# Patient Record
Sex: Male | Born: 1950
Health system: Southern US, Community
[De-identification: ages and names within clinical notes are randomized; demographics above are authoritative.]

## PROBLEM LIST (undated history)

## (undated) DIAGNOSIS — E7800 Pure hypercholesterolemia, unspecified: Secondary | ICD-10-CM

## (undated) DIAGNOSIS — I10 Essential (primary) hypertension: Secondary | ICD-10-CM

## (undated) DIAGNOSIS — T8859XA Other complications of anesthesia, initial encounter: Secondary | ICD-10-CM

## (undated) HISTORY — PX: APPENDECTOMY: SHX54

## (undated) HISTORY — PX: ROTATOR CUFF REPAIR: SHX139

---

## 2007-12-12 ENCOUNTER — Ambulatory Visit (HOSPITAL_BASED_OUTPATIENT_CLINIC_OR_DEPARTMENT_OTHER): Admission: RE | Admit: 2007-12-12 | Discharge: 2007-12-12 | Payer: Self-pay | Admitting: Orthopedic Surgery

## 2011-03-12 LAB — BASIC METABOLIC PANEL
BUN: 9
CO2: 29
Chloride: 103
Creatinine, Ser: 0.94
Glucose, Bld: 104 — ABNORMAL HIGH

## 2016-12-26 DIAGNOSIS — M7660 Achilles tendinitis, unspecified leg: Secondary | ICD-10-CM | POA: Diagnosis not present

## 2016-12-26 DIAGNOSIS — M79669 Pain in unspecified lower leg: Secondary | ICD-10-CM | POA: Diagnosis not present

## 2017-02-03 DIAGNOSIS — Z6838 Body mass index (BMI) 38.0-38.9, adult: Secondary | ICD-10-CM | POA: Diagnosis not present

## 2017-02-03 DIAGNOSIS — Z1389 Encounter for screening for other disorder: Secondary | ICD-10-CM | POA: Diagnosis not present

## 2017-02-03 DIAGNOSIS — I10 Essential (primary) hypertension: Secondary | ICD-10-CM | POA: Diagnosis not present

## 2017-02-03 DIAGNOSIS — R5383 Other fatigue: Secondary | ICD-10-CM | POA: Diagnosis not present

## 2017-02-03 DIAGNOSIS — Z9181 History of falling: Secondary | ICD-10-CM | POA: Diagnosis not present

## 2017-02-03 DIAGNOSIS — E785 Hyperlipidemia, unspecified: Secondary | ICD-10-CM | POA: Diagnosis not present

## 2017-02-03 DIAGNOSIS — Z79899 Other long term (current) drug therapy: Secondary | ICD-10-CM | POA: Diagnosis not present

## 2017-02-08 DIAGNOSIS — R5383 Other fatigue: Secondary | ICD-10-CM | POA: Diagnosis not present

## 2017-02-16 DIAGNOSIS — D510 Vitamin B12 deficiency anemia due to intrinsic factor deficiency: Secondary | ICD-10-CM | POA: Diagnosis not present

## 2017-02-22 DIAGNOSIS — D510 Vitamin B12 deficiency anemia due to intrinsic factor deficiency: Secondary | ICD-10-CM | POA: Diagnosis not present

## 2017-03-04 DIAGNOSIS — D510 Vitamin B12 deficiency anemia due to intrinsic factor deficiency: Secondary | ICD-10-CM | POA: Diagnosis not present

## 2017-04-09 DIAGNOSIS — Z23 Encounter for immunization: Secondary | ICD-10-CM | POA: Diagnosis not present

## 2017-04-09 DIAGNOSIS — E039 Hypothyroidism, unspecified: Secondary | ICD-10-CM | POA: Diagnosis not present

## 2017-04-09 DIAGNOSIS — M1990 Unspecified osteoarthritis, unspecified site: Secondary | ICD-10-CM | POA: Diagnosis not present

## 2017-04-09 DIAGNOSIS — I10 Essential (primary) hypertension: Secondary | ICD-10-CM | POA: Diagnosis not present

## 2017-04-09 DIAGNOSIS — D510 Vitamin B12 deficiency anemia due to intrinsic factor deficiency: Secondary | ICD-10-CM | POA: Diagnosis not present

## 2017-04-09 DIAGNOSIS — Z6838 Body mass index (BMI) 38.0-38.9, adult: Secondary | ICD-10-CM | POA: Diagnosis not present

## 2017-04-09 DIAGNOSIS — Z1339 Encounter for screening examination for other mental health and behavioral disorders: Secondary | ICD-10-CM | POA: Diagnosis not present

## 2017-04-12 DIAGNOSIS — Z125 Encounter for screening for malignant neoplasm of prostate: Secondary | ICD-10-CM | POA: Diagnosis not present

## 2017-04-12 DIAGNOSIS — E785 Hyperlipidemia, unspecified: Secondary | ICD-10-CM | POA: Diagnosis not present

## 2017-04-15 DIAGNOSIS — Z87891 Personal history of nicotine dependence: Secondary | ICD-10-CM | POA: Diagnosis not present

## 2017-04-15 DIAGNOSIS — Z136 Encounter for screening for cardiovascular disorders: Secondary | ICD-10-CM | POA: Diagnosis not present

## 2017-04-15 DIAGNOSIS — R011 Cardiac murmur, unspecified: Secondary | ICD-10-CM | POA: Diagnosis not present

## 2017-04-15 DIAGNOSIS — F17210 Nicotine dependence, cigarettes, uncomplicated: Secondary | ICD-10-CM | POA: Diagnosis not present

## 2017-04-15 DIAGNOSIS — K7689 Other specified diseases of liver: Secondary | ICD-10-CM | POA: Diagnosis not present

## 2017-04-22 DIAGNOSIS — M6702 Short Achilles tendon (acquired), left ankle: Secondary | ICD-10-CM | POA: Diagnosis not present

## 2017-04-22 DIAGNOSIS — M7662 Achilles tendinitis, left leg: Secondary | ICD-10-CM | POA: Diagnosis not present

## 2017-04-22 DIAGNOSIS — M7732 Calcaneal spur, left foot: Secondary | ICD-10-CM | POA: Diagnosis not present

## 2017-04-26 DIAGNOSIS — R160 Hepatomegaly, not elsewhere classified: Secondary | ICD-10-CM | POA: Diagnosis not present

## 2017-04-26 DIAGNOSIS — K760 Fatty (change of) liver, not elsewhere classified: Secondary | ICD-10-CM | POA: Diagnosis not present

## 2017-05-13 DIAGNOSIS — M6702 Short Achilles tendon (acquired), left ankle: Secondary | ICD-10-CM | POA: Diagnosis not present

## 2017-05-13 DIAGNOSIS — L603 Nail dystrophy: Secondary | ICD-10-CM | POA: Diagnosis not present

## 2017-05-13 DIAGNOSIS — M7662 Achilles tendinitis, left leg: Secondary | ICD-10-CM | POA: Diagnosis not present

## 2017-05-13 DIAGNOSIS — M7732 Calcaneal spur, left foot: Secondary | ICD-10-CM | POA: Diagnosis not present

## 2017-06-01 DIAGNOSIS — K760 Fatty (change of) liver, not elsewhere classified: Secondary | ICD-10-CM | POA: Diagnosis not present

## 2017-06-01 DIAGNOSIS — D510 Vitamin B12 deficiency anemia due to intrinsic factor deficiency: Secondary | ICD-10-CM | POA: Diagnosis not present

## 2017-06-01 DIAGNOSIS — R933 Abnormal findings on diagnostic imaging of other parts of digestive tract: Secondary | ICD-10-CM | POA: Diagnosis not present

## 2017-06-03 DIAGNOSIS — M6702 Short Achilles tendon (acquired), left ankle: Secondary | ICD-10-CM | POA: Diagnosis not present

## 2017-06-03 DIAGNOSIS — M7732 Calcaneal spur, left foot: Secondary | ICD-10-CM | POA: Diagnosis not present

## 2017-06-03 DIAGNOSIS — M7662 Achilles tendinitis, left leg: Secondary | ICD-10-CM | POA: Diagnosis not present

## 2017-06-03 DIAGNOSIS — L603 Nail dystrophy: Secondary | ICD-10-CM | POA: Diagnosis not present

## 2017-07-01 DIAGNOSIS — D510 Vitamin B12 deficiency anemia due to intrinsic factor deficiency: Secondary | ICD-10-CM | POA: Diagnosis not present

## 2017-08-11 DIAGNOSIS — D510 Vitamin B12 deficiency anemia due to intrinsic factor deficiency: Secondary | ICD-10-CM | POA: Diagnosis not present

## 2017-09-09 DIAGNOSIS — E7800 Pure hypercholesterolemia, unspecified: Secondary | ICD-10-CM | POA: Diagnosis not present

## 2017-09-09 DIAGNOSIS — I10 Essential (primary) hypertension: Secondary | ICD-10-CM | POA: Diagnosis not present

## 2017-09-09 DIAGNOSIS — Z6838 Body mass index (BMI) 38.0-38.9, adult: Secondary | ICD-10-CM | POA: Diagnosis not present

## 2017-10-18 DIAGNOSIS — D510 Vitamin B12 deficiency anemia due to intrinsic factor deficiency: Secondary | ICD-10-CM | POA: Diagnosis not present

## 2017-12-31 DIAGNOSIS — D510 Vitamin B12 deficiency anemia due to intrinsic factor deficiency: Secondary | ICD-10-CM | POA: Diagnosis not present

## 2018-02-28 DIAGNOSIS — D510 Vitamin B12 deficiency anemia due to intrinsic factor deficiency: Secondary | ICD-10-CM | POA: Diagnosis not present

## 2018-03-23 DIAGNOSIS — M25512 Pain in left shoulder: Secondary | ICD-10-CM | POA: Diagnosis not present

## 2018-03-23 DIAGNOSIS — M7542 Impingement syndrome of left shoulder: Secondary | ICD-10-CM | POA: Diagnosis not present

## 2018-03-29 DIAGNOSIS — D510 Vitamin B12 deficiency anemia due to intrinsic factor deficiency: Secondary | ICD-10-CM | POA: Diagnosis not present

## 2018-03-30 DIAGNOSIS — M25512 Pain in left shoulder: Secondary | ICD-10-CM | POA: Diagnosis not present

## 2018-03-30 DIAGNOSIS — R2689 Other abnormalities of gait and mobility: Secondary | ICD-10-CM | POA: Diagnosis not present

## 2018-04-01 DIAGNOSIS — M25512 Pain in left shoulder: Secondary | ICD-10-CM | POA: Diagnosis not present

## 2018-04-01 DIAGNOSIS — R2689 Other abnormalities of gait and mobility: Secondary | ICD-10-CM | POA: Diagnosis not present

## 2018-04-11 DIAGNOSIS — Z125 Encounter for screening for malignant neoplasm of prostate: Secondary | ICD-10-CM | POA: Diagnosis not present

## 2018-04-11 DIAGNOSIS — Z9181 History of falling: Secondary | ICD-10-CM | POA: Diagnosis not present

## 2018-04-11 DIAGNOSIS — Z79899 Other long term (current) drug therapy: Secondary | ICD-10-CM | POA: Diagnosis not present

## 2018-04-11 DIAGNOSIS — E039 Hypothyroidism, unspecified: Secondary | ICD-10-CM | POA: Diagnosis not present

## 2018-04-11 DIAGNOSIS — Z1331 Encounter for screening for depression: Secondary | ICD-10-CM | POA: Diagnosis not present

## 2018-04-11 DIAGNOSIS — Z2821 Immunization not carried out because of patient refusal: Secondary | ICD-10-CM | POA: Diagnosis not present

## 2018-04-11 DIAGNOSIS — E785 Hyperlipidemia, unspecified: Secondary | ICD-10-CM | POA: Diagnosis not present

## 2018-04-11 DIAGNOSIS — I10 Essential (primary) hypertension: Secondary | ICD-10-CM | POA: Diagnosis not present

## 2018-04-11 DIAGNOSIS — Z0000 Encounter for general adult medical examination without abnormal findings: Secondary | ICD-10-CM | POA: Diagnosis not present

## 2018-04-27 DIAGNOSIS — Z23 Encounter for immunization: Secondary | ICD-10-CM | POA: Diagnosis not present

## 2018-05-03 DIAGNOSIS — D510 Vitamin B12 deficiency anemia due to intrinsic factor deficiency: Secondary | ICD-10-CM | POA: Diagnosis not present

## 2018-05-09 DIAGNOSIS — M25512 Pain in left shoulder: Secondary | ICD-10-CM | POA: Diagnosis not present

## 2018-05-19 DIAGNOSIS — M19012 Primary osteoarthritis, left shoulder: Secondary | ICD-10-CM | POA: Diagnosis not present

## 2018-05-19 DIAGNOSIS — M25512 Pain in left shoulder: Secondary | ICD-10-CM | POA: Diagnosis not present

## 2018-05-20 DIAGNOSIS — K7689 Other specified diseases of liver: Secondary | ICD-10-CM | POA: Diagnosis not present

## 2018-05-20 DIAGNOSIS — K760 Fatty (change of) liver, not elsewhere classified: Secondary | ICD-10-CM | POA: Diagnosis not present

## 2018-05-23 DIAGNOSIS — D510 Vitamin B12 deficiency anemia due to intrinsic factor deficiency: Secondary | ICD-10-CM | POA: Diagnosis not present

## 2018-05-23 DIAGNOSIS — S46012A Strain of muscle(s) and tendon(s) of the rotator cuff of left shoulder, initial encounter: Secondary | ICD-10-CM | POA: Diagnosis not present

## 2018-06-14 DIAGNOSIS — K760 Fatty (change of) liver, not elsewhere classified: Secondary | ICD-10-CM | POA: Diagnosis not present

## 2018-06-17 DIAGNOSIS — E7800 Pure hypercholesterolemia, unspecified: Secondary | ICD-10-CM | POA: Diagnosis not present

## 2018-06-17 DIAGNOSIS — M65812 Other synovitis and tenosynovitis, left shoulder: Secondary | ICD-10-CM | POA: Diagnosis not present

## 2018-06-17 DIAGNOSIS — G4730 Sleep apnea, unspecified: Secondary | ICD-10-CM | POA: Diagnosis not present

## 2018-06-17 DIAGNOSIS — G8918 Other acute postprocedural pain: Secondary | ICD-10-CM | POA: Diagnosis not present

## 2018-06-17 DIAGNOSIS — Z01818 Encounter for other preprocedural examination: Secondary | ICD-10-CM | POA: Diagnosis not present

## 2018-06-17 DIAGNOSIS — M75122 Complete rotator cuff tear or rupture of left shoulder, not specified as traumatic: Secondary | ICD-10-CM | POA: Diagnosis not present

## 2018-06-17 DIAGNOSIS — S43432A Superior glenoid labrum lesion of left shoulder, initial encounter: Secondary | ICD-10-CM | POA: Diagnosis not present

## 2018-06-17 DIAGNOSIS — Z87891 Personal history of nicotine dependence: Secondary | ICD-10-CM | POA: Diagnosis not present

## 2018-06-17 DIAGNOSIS — M6788 Other specified disorders of synovium and tendon, other site: Secondary | ICD-10-CM | POA: Diagnosis not present

## 2018-06-17 DIAGNOSIS — S46012A Strain of muscle(s) and tendon(s) of the rotator cuff of left shoulder, initial encounter: Secondary | ICD-10-CM | POA: Diagnosis not present

## 2018-06-17 DIAGNOSIS — Z6838 Body mass index (BMI) 38.0-38.9, adult: Secondary | ICD-10-CM | POA: Diagnosis not present

## 2018-06-17 DIAGNOSIS — M75102 Unspecified rotator cuff tear or rupture of left shoulder, not specified as traumatic: Secondary | ICD-10-CM | POA: Diagnosis not present

## 2018-06-17 DIAGNOSIS — I10 Essential (primary) hypertension: Secondary | ICD-10-CM | POA: Diagnosis not present

## 2018-06-17 DIAGNOSIS — Z7982 Long term (current) use of aspirin: Secondary | ICD-10-CM | POA: Diagnosis not present

## 2018-06-17 DIAGNOSIS — Z01810 Encounter for preprocedural cardiovascular examination: Secondary | ICD-10-CM | POA: Diagnosis not present

## 2018-06-17 DIAGNOSIS — M1990 Unspecified osteoarthritis, unspecified site: Secondary | ICD-10-CM | POA: Diagnosis not present

## 2018-06-17 DIAGNOSIS — M25512 Pain in left shoulder: Secondary | ICD-10-CM | POA: Diagnosis not present

## 2018-06-17 DIAGNOSIS — Z79899 Other long term (current) drug therapy: Secondary | ICD-10-CM | POA: Diagnosis not present

## 2018-06-17 DIAGNOSIS — M7552 Bursitis of left shoulder: Secondary | ICD-10-CM | POA: Diagnosis not present

## 2018-06-20 DIAGNOSIS — M25612 Stiffness of left shoulder, not elsewhere classified: Secondary | ICD-10-CM | POA: Diagnosis not present

## 2018-06-20 DIAGNOSIS — M25412 Effusion, left shoulder: Secondary | ICD-10-CM | POA: Diagnosis not present

## 2018-06-20 DIAGNOSIS — M25512 Pain in left shoulder: Secondary | ICD-10-CM | POA: Diagnosis not present

## 2018-06-29 DIAGNOSIS — M25412 Effusion, left shoulder: Secondary | ICD-10-CM | POA: Diagnosis not present

## 2018-06-29 DIAGNOSIS — M25512 Pain in left shoulder: Secondary | ICD-10-CM | POA: Diagnosis not present

## 2018-06-29 DIAGNOSIS — M25612 Stiffness of left shoulder, not elsewhere classified: Secondary | ICD-10-CM | POA: Diagnosis not present

## 2018-07-07 DIAGNOSIS — M25412 Effusion, left shoulder: Secondary | ICD-10-CM | POA: Diagnosis not present

## 2018-07-07 DIAGNOSIS — M25612 Stiffness of left shoulder, not elsewhere classified: Secondary | ICD-10-CM | POA: Diagnosis not present

## 2018-07-07 DIAGNOSIS — M25512 Pain in left shoulder: Secondary | ICD-10-CM | POA: Diagnosis not present

## 2018-07-11 DIAGNOSIS — M25412 Effusion, left shoulder: Secondary | ICD-10-CM | POA: Diagnosis not present

## 2018-07-11 DIAGNOSIS — M25612 Stiffness of left shoulder, not elsewhere classified: Secondary | ICD-10-CM | POA: Diagnosis not present

## 2018-07-11 DIAGNOSIS — M25512 Pain in left shoulder: Secondary | ICD-10-CM | POA: Diagnosis not present

## 2018-07-13 DIAGNOSIS — L603 Nail dystrophy: Secondary | ICD-10-CM | POA: Diagnosis not present

## 2018-07-13 DIAGNOSIS — M7732 Calcaneal spur, left foot: Secondary | ICD-10-CM | POA: Diagnosis not present

## 2018-07-13 DIAGNOSIS — M7662 Achilles tendinitis, left leg: Secondary | ICD-10-CM | POA: Diagnosis not present

## 2018-08-04 DIAGNOSIS — M6528 Calcific tendinitis, other site: Secondary | ICD-10-CM | POA: Diagnosis not present

## 2018-08-05 DIAGNOSIS — D510 Vitamin B12 deficiency anemia due to intrinsic factor deficiency: Secondary | ICD-10-CM | POA: Diagnosis not present

## 2018-08-31 DIAGNOSIS — M542 Cervicalgia: Secondary | ICD-10-CM | POA: Diagnosis not present

## 2018-08-31 DIAGNOSIS — M9901 Segmental and somatic dysfunction of cervical region: Secondary | ICD-10-CM | POA: Diagnosis not present

## 2018-08-31 DIAGNOSIS — M545 Low back pain: Secondary | ICD-10-CM | POA: Diagnosis not present

## 2018-08-31 DIAGNOSIS — M9903 Segmental and somatic dysfunction of lumbar region: Secondary | ICD-10-CM | POA: Diagnosis not present

## 2018-08-31 DIAGNOSIS — M9902 Segmental and somatic dysfunction of thoracic region: Secondary | ICD-10-CM | POA: Diagnosis not present

## 2018-09-19 DIAGNOSIS — M25412 Effusion, left shoulder: Secondary | ICD-10-CM | POA: Diagnosis not present

## 2018-09-19 DIAGNOSIS — M25612 Stiffness of left shoulder, not elsewhere classified: Secondary | ICD-10-CM | POA: Diagnosis not present

## 2018-09-19 DIAGNOSIS — M25512 Pain in left shoulder: Secondary | ICD-10-CM | POA: Diagnosis not present

## 2018-09-22 DIAGNOSIS — M25512 Pain in left shoulder: Secondary | ICD-10-CM | POA: Diagnosis not present

## 2018-09-22 DIAGNOSIS — M25612 Stiffness of left shoulder, not elsewhere classified: Secondary | ICD-10-CM | POA: Diagnosis not present

## 2018-09-22 DIAGNOSIS — M25412 Effusion, left shoulder: Secondary | ICD-10-CM | POA: Diagnosis not present

## 2018-09-29 DIAGNOSIS — M25412 Effusion, left shoulder: Secondary | ICD-10-CM | POA: Diagnosis not present

## 2018-09-29 DIAGNOSIS — M25512 Pain in left shoulder: Secondary | ICD-10-CM | POA: Diagnosis not present

## 2018-09-29 DIAGNOSIS — M25612 Stiffness of left shoulder, not elsewhere classified: Secondary | ICD-10-CM | POA: Diagnosis not present

## 2018-10-03 DIAGNOSIS — M25512 Pain in left shoulder: Secondary | ICD-10-CM | POA: Diagnosis not present

## 2018-10-03 DIAGNOSIS — M25612 Stiffness of left shoulder, not elsewhere classified: Secondary | ICD-10-CM | POA: Diagnosis not present

## 2018-10-03 DIAGNOSIS — M25412 Effusion, left shoulder: Secondary | ICD-10-CM | POA: Diagnosis not present

## 2018-10-06 DIAGNOSIS — M25612 Stiffness of left shoulder, not elsewhere classified: Secondary | ICD-10-CM | POA: Diagnosis not present

## 2018-10-06 DIAGNOSIS — M25412 Effusion, left shoulder: Secondary | ICD-10-CM | POA: Diagnosis not present

## 2018-10-06 DIAGNOSIS — M25512 Pain in left shoulder: Secondary | ICD-10-CM | POA: Diagnosis not present

## 2018-10-07 DIAGNOSIS — Z6836 Body mass index (BMI) 36.0-36.9, adult: Secondary | ICD-10-CM | POA: Diagnosis not present

## 2018-10-07 DIAGNOSIS — E538 Deficiency of other specified B group vitamins: Secondary | ICD-10-CM | POA: Diagnosis not present

## 2018-10-07 DIAGNOSIS — R600 Localized edema: Secondary | ICD-10-CM | POA: Diagnosis not present

## 2018-10-07 DIAGNOSIS — I10 Essential (primary) hypertension: Secondary | ICD-10-CM | POA: Diagnosis not present

## 2018-10-11 DIAGNOSIS — M25412 Effusion, left shoulder: Secondary | ICD-10-CM | POA: Diagnosis not present

## 2018-10-11 DIAGNOSIS — M25512 Pain in left shoulder: Secondary | ICD-10-CM | POA: Diagnosis not present

## 2018-10-11 DIAGNOSIS — M25612 Stiffness of left shoulder, not elsewhere classified: Secondary | ICD-10-CM | POA: Diagnosis not present

## 2018-10-13 DIAGNOSIS — M25612 Stiffness of left shoulder, not elsewhere classified: Secondary | ICD-10-CM | POA: Diagnosis not present

## 2018-10-13 DIAGNOSIS — M25512 Pain in left shoulder: Secondary | ICD-10-CM | POA: Diagnosis not present

## 2018-10-13 DIAGNOSIS — S46012A Strain of muscle(s) and tendon(s) of the rotator cuff of left shoulder, initial encounter: Secondary | ICD-10-CM | POA: Diagnosis not present

## 2018-10-13 DIAGNOSIS — M25412 Effusion, left shoulder: Secondary | ICD-10-CM | POA: Diagnosis not present

## 2018-10-17 DIAGNOSIS — M25512 Pain in left shoulder: Secondary | ICD-10-CM | POA: Diagnosis not present

## 2018-10-17 DIAGNOSIS — M1612 Unilateral primary osteoarthritis, left hip: Secondary | ICD-10-CM | POA: Diagnosis not present

## 2018-10-19 DIAGNOSIS — S46012A Strain of muscle(s) and tendon(s) of the rotator cuff of left shoulder, initial encounter: Secondary | ICD-10-CM | POA: Diagnosis not present

## 2018-10-20 DIAGNOSIS — M25612 Stiffness of left shoulder, not elsewhere classified: Secondary | ICD-10-CM | POA: Diagnosis not present

## 2018-10-20 DIAGNOSIS — M25512 Pain in left shoulder: Secondary | ICD-10-CM | POA: Diagnosis not present

## 2018-10-20 DIAGNOSIS — M25412 Effusion, left shoulder: Secondary | ICD-10-CM | POA: Diagnosis not present

## 2018-10-26 DIAGNOSIS — M25412 Effusion, left shoulder: Secondary | ICD-10-CM | POA: Diagnosis not present

## 2018-10-26 DIAGNOSIS — M25512 Pain in left shoulder: Secondary | ICD-10-CM | POA: Diagnosis not present

## 2018-10-26 DIAGNOSIS — M25612 Stiffness of left shoulder, not elsewhere classified: Secondary | ICD-10-CM | POA: Diagnosis not present

## 2018-11-08 DIAGNOSIS — M25612 Stiffness of left shoulder, not elsewhere classified: Secondary | ICD-10-CM | POA: Diagnosis not present

## 2018-11-08 DIAGNOSIS — M25412 Effusion, left shoulder: Secondary | ICD-10-CM | POA: Diagnosis not present

## 2018-11-08 DIAGNOSIS — M25512 Pain in left shoulder: Secondary | ICD-10-CM | POA: Diagnosis not present

## 2018-11-23 DIAGNOSIS — M25612 Stiffness of left shoulder, not elsewhere classified: Secondary | ICD-10-CM | POA: Diagnosis not present

## 2018-11-23 DIAGNOSIS — M25512 Pain in left shoulder: Secondary | ICD-10-CM | POA: Diagnosis not present

## 2019-01-17 DIAGNOSIS — M25572 Pain in left ankle and joints of left foot: Secondary | ICD-10-CM | POA: Diagnosis not present

## 2019-01-17 DIAGNOSIS — M7989 Other specified soft tissue disorders: Secondary | ICD-10-CM | POA: Diagnosis not present

## 2019-01-17 DIAGNOSIS — R936 Abnormal findings on diagnostic imaging of limbs: Secondary | ICD-10-CM | POA: Diagnosis not present

## 2019-02-07 DIAGNOSIS — D519 Vitamin B12 deficiency anemia, unspecified: Secondary | ICD-10-CM | POA: Diagnosis not present

## 2019-02-07 DIAGNOSIS — H524 Presbyopia: Secondary | ICD-10-CM | POA: Diagnosis not present

## 2019-02-07 DIAGNOSIS — H26491 Other secondary cataract, right eye: Secondary | ICD-10-CM | POA: Diagnosis not present

## 2019-02-07 DIAGNOSIS — Z961 Presence of intraocular lens: Secondary | ICD-10-CM | POA: Diagnosis not present

## 2019-03-13 DIAGNOSIS — D519 Vitamin B12 deficiency anemia, unspecified: Secondary | ICD-10-CM | POA: Diagnosis not present

## 2019-03-13 DIAGNOSIS — Z23 Encounter for immunization: Secondary | ICD-10-CM | POA: Diagnosis not present

## 2019-03-20 DIAGNOSIS — Z86010 Personal history of colonic polyps: Secondary | ICD-10-CM | POA: Diagnosis not present

## 2019-03-20 DIAGNOSIS — K635 Polyp of colon: Secondary | ICD-10-CM | POA: Diagnosis not present

## 2019-03-20 DIAGNOSIS — Z1211 Encounter for screening for malignant neoplasm of colon: Secondary | ICD-10-CM | POA: Diagnosis not present

## 2019-03-20 DIAGNOSIS — K5730 Diverticulosis of large intestine without perforation or abscess without bleeding: Secondary | ICD-10-CM | POA: Diagnosis not present

## 2019-04-13 DIAGNOSIS — Z125 Encounter for screening for malignant neoplasm of prostate: Secondary | ICD-10-CM | POA: Diagnosis not present

## 2019-04-13 DIAGNOSIS — Z0000 Encounter for general adult medical examination without abnormal findings: Secondary | ICD-10-CM | POA: Diagnosis not present

## 2019-04-13 DIAGNOSIS — Z79899 Other long term (current) drug therapy: Secondary | ICD-10-CM | POA: Diagnosis not present

## 2019-04-13 DIAGNOSIS — Z6837 Body mass index (BMI) 37.0-37.9, adult: Secondary | ICD-10-CM | POA: Diagnosis not present

## 2019-04-13 DIAGNOSIS — E039 Hypothyroidism, unspecified: Secondary | ICD-10-CM | POA: Diagnosis not present

## 2019-04-13 DIAGNOSIS — Z1331 Encounter for screening for depression: Secondary | ICD-10-CM | POA: Diagnosis not present

## 2019-04-13 DIAGNOSIS — E785 Hyperlipidemia, unspecified: Secondary | ICD-10-CM | POA: Diagnosis not present

## 2019-04-13 DIAGNOSIS — Z9181 History of falling: Secondary | ICD-10-CM | POA: Diagnosis not present

## 2019-04-24 DIAGNOSIS — L814 Other melanin hyperpigmentation: Secondary | ICD-10-CM | POA: Diagnosis not present

## 2019-04-24 DIAGNOSIS — L570 Actinic keratosis: Secondary | ICD-10-CM | POA: Diagnosis not present

## 2019-04-24 DIAGNOSIS — D485 Neoplasm of uncertain behavior of skin: Secondary | ICD-10-CM | POA: Diagnosis not present

## 2019-04-24 DIAGNOSIS — L820 Inflamed seborrheic keratosis: Secondary | ICD-10-CM | POA: Diagnosis not present

## 2019-04-24 DIAGNOSIS — D2272 Melanocytic nevi of left lower limb, including hip: Secondary | ICD-10-CM | POA: Diagnosis not present

## 2019-04-24 DIAGNOSIS — D692 Other nonthrombocytopenic purpura: Secondary | ICD-10-CM | POA: Diagnosis not present

## 2019-04-24 DIAGNOSIS — X32XXXS Exposure to sunlight, sequela: Secondary | ICD-10-CM | POA: Diagnosis not present

## 2019-05-03 DIAGNOSIS — E538 Deficiency of other specified B group vitamins: Secondary | ICD-10-CM | POA: Diagnosis not present

## 2019-05-25 DIAGNOSIS — R0989 Other specified symptoms and signs involving the circulatory and respiratory systems: Secondary | ICD-10-CM | POA: Diagnosis not present

## 2019-07-12 DIAGNOSIS — K760 Fatty (change of) liver, not elsewhere classified: Secondary | ICD-10-CM | POA: Diagnosis not present

## 2019-07-12 DIAGNOSIS — E538 Deficiency of other specified B group vitamins: Secondary | ICD-10-CM | POA: Diagnosis not present

## 2019-10-24 DIAGNOSIS — M6702 Short Achilles tendon (acquired), left ankle: Secondary | ICD-10-CM | POA: Diagnosis not present

## 2019-10-24 DIAGNOSIS — M7732 Calcaneal spur, left foot: Secondary | ICD-10-CM | POA: Diagnosis not present

## 2019-10-24 DIAGNOSIS — M7662 Achilles tendinitis, left leg: Secondary | ICD-10-CM | POA: Diagnosis not present

## 2019-10-27 DIAGNOSIS — D519 Vitamin B12 deficiency anemia, unspecified: Secondary | ICD-10-CM | POA: Diagnosis not present

## 2019-10-30 DIAGNOSIS — M25672 Stiffness of left ankle, not elsewhere classified: Secondary | ICD-10-CM | POA: Diagnosis not present

## 2019-10-30 DIAGNOSIS — R262 Difficulty in walking, not elsewhere classified: Secondary | ICD-10-CM | POA: Diagnosis not present

## 2019-10-30 DIAGNOSIS — M25572 Pain in left ankle and joints of left foot: Secondary | ICD-10-CM | POA: Diagnosis not present

## 2019-10-30 DIAGNOSIS — M7732 Calcaneal spur, left foot: Secondary | ICD-10-CM | POA: Diagnosis not present

## 2019-10-30 DIAGNOSIS — M7662 Achilles tendinitis, left leg: Secondary | ICD-10-CM | POA: Diagnosis not present

## 2019-11-03 DIAGNOSIS — M7732 Calcaneal spur, left foot: Secondary | ICD-10-CM | POA: Diagnosis not present

## 2019-11-03 DIAGNOSIS — M7662 Achilles tendinitis, left leg: Secondary | ICD-10-CM | POA: Diagnosis not present

## 2019-11-03 DIAGNOSIS — R262 Difficulty in walking, not elsewhere classified: Secondary | ICD-10-CM | POA: Diagnosis not present

## 2019-11-03 DIAGNOSIS — M25572 Pain in left ankle and joints of left foot: Secondary | ICD-10-CM | POA: Diagnosis not present

## 2019-11-03 DIAGNOSIS — M25672 Stiffness of left ankle, not elsewhere classified: Secondary | ICD-10-CM | POA: Diagnosis not present

## 2019-11-06 DIAGNOSIS — M25461 Effusion, right knee: Secondary | ICD-10-CM | POA: Diagnosis not present

## 2019-11-06 DIAGNOSIS — M25561 Pain in right knee: Secondary | ICD-10-CM | POA: Diagnosis not present

## 2019-11-10 DIAGNOSIS — M7662 Achilles tendinitis, left leg: Secondary | ICD-10-CM | POA: Diagnosis not present

## 2019-11-10 DIAGNOSIS — R262 Difficulty in walking, not elsewhere classified: Secondary | ICD-10-CM | POA: Diagnosis not present

## 2019-11-10 DIAGNOSIS — M7732 Calcaneal spur, left foot: Secondary | ICD-10-CM | POA: Diagnosis not present

## 2019-11-10 DIAGNOSIS — M25672 Stiffness of left ankle, not elsewhere classified: Secondary | ICD-10-CM | POA: Diagnosis not present

## 2019-11-10 DIAGNOSIS — M25572 Pain in left ankle and joints of left foot: Secondary | ICD-10-CM | POA: Diagnosis not present

## 2020-02-01 DIAGNOSIS — H6121 Impacted cerumen, right ear: Secondary | ICD-10-CM | POA: Diagnosis not present

## 2020-02-01 DIAGNOSIS — H6122 Impacted cerumen, left ear: Secondary | ICD-10-CM | POA: Diagnosis not present

## 2020-02-07 DIAGNOSIS — M25461 Effusion, right knee: Secondary | ICD-10-CM | POA: Diagnosis not present

## 2020-02-07 DIAGNOSIS — M25561 Pain in right knee: Secondary | ICD-10-CM | POA: Diagnosis not present

## 2020-02-16 DIAGNOSIS — S83231A Complex tear of medial meniscus, current injury, right knee, initial encounter: Secondary | ICD-10-CM | POA: Diagnosis not present

## 2020-02-16 DIAGNOSIS — M25561 Pain in right knee: Secondary | ICD-10-CM | POA: Diagnosis not present

## 2020-02-16 DIAGNOSIS — G8929 Other chronic pain: Secondary | ICD-10-CM | POA: Diagnosis not present

## 2020-02-16 DIAGNOSIS — M23221 Derangement of posterior horn of medial meniscus due to old tear or injury, right knee: Secondary | ICD-10-CM | POA: Diagnosis not present

## 2020-02-16 DIAGNOSIS — M7121 Synovial cyst of popliteal space [Baker], right knee: Secondary | ICD-10-CM | POA: Diagnosis not present

## 2020-02-16 DIAGNOSIS — M25461 Effusion, right knee: Secondary | ICD-10-CM | POA: Diagnosis not present

## 2020-02-27 DIAGNOSIS — L570 Actinic keratosis: Secondary | ICD-10-CM | POA: Diagnosis not present

## 2020-02-27 DIAGNOSIS — D692 Other nonthrombocytopenic purpura: Secondary | ICD-10-CM | POA: Diagnosis not present

## 2020-02-29 DIAGNOSIS — M25561 Pain in right knee: Secondary | ICD-10-CM | POA: Diagnosis not present

## 2020-03-15 DIAGNOSIS — D510 Vitamin B12 deficiency anemia due to intrinsic factor deficiency: Secondary | ICD-10-CM | POA: Diagnosis not present

## 2020-03-25 DIAGNOSIS — Z23 Encounter for immunization: Secondary | ICD-10-CM | POA: Diagnosis not present

## 2020-05-29 DIAGNOSIS — R0981 Nasal congestion: Secondary | ICD-10-CM | POA: Diagnosis not present

## 2020-05-29 DIAGNOSIS — J0100 Acute maxillary sinusitis, unspecified: Secondary | ICD-10-CM | POA: Diagnosis not present

## 2020-05-29 DIAGNOSIS — H6121 Impacted cerumen, right ear: Secondary | ICD-10-CM | POA: Diagnosis not present

## 2020-05-29 DIAGNOSIS — R519 Headache, unspecified: Secondary | ICD-10-CM | POA: Diagnosis not present

## 2020-06-13 DIAGNOSIS — I10 Essential (primary) hypertension: Secondary | ICD-10-CM | POA: Diagnosis not present

## 2020-06-13 DIAGNOSIS — D519 Vitamin B12 deficiency anemia, unspecified: Secondary | ICD-10-CM | POA: Diagnosis not present

## 2020-06-13 DIAGNOSIS — E7800 Pure hypercholesterolemia, unspecified: Secondary | ICD-10-CM | POA: Diagnosis not present

## 2020-07-12 DIAGNOSIS — Z1331 Encounter for screening for depression: Secondary | ICD-10-CM | POA: Diagnosis not present

## 2020-07-12 DIAGNOSIS — T148XXA Other injury of unspecified body region, initial encounter: Secondary | ICD-10-CM | POA: Diagnosis not present

## 2020-07-12 DIAGNOSIS — Z125 Encounter for screening for malignant neoplasm of prostate: Secondary | ICD-10-CM | POA: Diagnosis not present

## 2020-07-12 DIAGNOSIS — Z79899 Other long term (current) drug therapy: Secondary | ICD-10-CM | POA: Diagnosis not present

## 2020-07-12 DIAGNOSIS — Z6837 Body mass index (BMI) 37.0-37.9, adult: Secondary | ICD-10-CM | POA: Diagnosis not present

## 2020-07-12 DIAGNOSIS — Z9181 History of falling: Secondary | ICD-10-CM | POA: Diagnosis not present

## 2020-07-12 DIAGNOSIS — Z0000 Encounter for general adult medical examination without abnormal findings: Secondary | ICD-10-CM | POA: Diagnosis not present

## 2020-07-12 DIAGNOSIS — E7800 Pure hypercholesterolemia, unspecified: Secondary | ICD-10-CM | POA: Diagnosis not present

## 2020-07-12 DIAGNOSIS — E039 Hypothyroidism, unspecified: Secondary | ICD-10-CM | POA: Diagnosis not present

## 2020-07-12 DIAGNOSIS — I10 Essential (primary) hypertension: Secondary | ICD-10-CM | POA: Diagnosis not present

## 2020-07-14 DIAGNOSIS — D519 Vitamin B12 deficiency anemia, unspecified: Secondary | ICD-10-CM | POA: Diagnosis not present

## 2020-07-14 DIAGNOSIS — I10 Essential (primary) hypertension: Secondary | ICD-10-CM | POA: Diagnosis not present

## 2020-07-14 DIAGNOSIS — E7800 Pure hypercholesterolemia, unspecified: Secondary | ICD-10-CM | POA: Diagnosis not present

## 2020-08-12 DIAGNOSIS — I10 Essential (primary) hypertension: Secondary | ICD-10-CM | POA: Diagnosis not present

## 2020-08-12 DIAGNOSIS — E7800 Pure hypercholesterolemia, unspecified: Secondary | ICD-10-CM | POA: Diagnosis not present

## 2020-08-16 DIAGNOSIS — R1032 Left lower quadrant pain: Secondary | ICD-10-CM | POA: Diagnosis not present

## 2020-08-16 DIAGNOSIS — R319 Hematuria, unspecified: Secondary | ICD-10-CM | POA: Diagnosis not present

## 2020-08-16 DIAGNOSIS — N3091 Cystitis, unspecified with hematuria: Secondary | ICD-10-CM | POA: Diagnosis not present

## 2020-08-16 DIAGNOSIS — R109 Unspecified abdominal pain: Secondary | ICD-10-CM | POA: Diagnosis not present

## 2020-08-16 DIAGNOSIS — R1084 Generalized abdominal pain: Secondary | ICD-10-CM | POA: Diagnosis not present

## 2020-09-11 DIAGNOSIS — I10 Essential (primary) hypertension: Secondary | ICD-10-CM | POA: Diagnosis not present

## 2020-09-11 DIAGNOSIS — D510 Vitamin B12 deficiency anemia due to intrinsic factor deficiency: Secondary | ICD-10-CM | POA: Diagnosis not present

## 2020-09-11 DIAGNOSIS — E7800 Pure hypercholesterolemia, unspecified: Secondary | ICD-10-CM | POA: Diagnosis not present

## 2020-10-12 DIAGNOSIS — I10 Essential (primary) hypertension: Secondary | ICD-10-CM | POA: Diagnosis not present

## 2020-10-12 DIAGNOSIS — E7800 Pure hypercholesterolemia, unspecified: Secondary | ICD-10-CM | POA: Diagnosis not present

## 2020-10-12 DIAGNOSIS — D510 Vitamin B12 deficiency anemia due to intrinsic factor deficiency: Secondary | ICD-10-CM | POA: Diagnosis not present

## 2020-10-22 DIAGNOSIS — R310 Gross hematuria: Secondary | ICD-10-CM | POA: Diagnosis not present

## 2020-10-28 DIAGNOSIS — R310 Gross hematuria: Secondary | ICD-10-CM | POA: Diagnosis not present

## 2020-10-28 DIAGNOSIS — K5750 Diverticulosis of both small and large intestine without perforation or abscess without bleeding: Secondary | ICD-10-CM | POA: Diagnosis not present

## 2020-10-28 DIAGNOSIS — K3189 Other diseases of stomach and duodenum: Secondary | ICD-10-CM | POA: Diagnosis not present

## 2020-10-28 DIAGNOSIS — N3289 Other specified disorders of bladder: Secondary | ICD-10-CM | POA: Diagnosis not present

## 2020-11-13 ENCOUNTER — Encounter (HOSPITAL_COMMUNITY): Payer: Self-pay | Admitting: Urology

## 2020-11-13 ENCOUNTER — Observation Stay (HOSPITAL_COMMUNITY)
Admission: AD | Admit: 2020-11-13 | Discharge: 2020-11-14 | Disposition: A | Discharge status: Home / Self Care | Payer: Medicare Other | Source: Ambulatory Visit | Attending: Urology | Admitting: Urology

## 2020-11-13 ENCOUNTER — Other Ambulatory Visit: Payer: Self-pay

## 2020-11-13 DIAGNOSIS — D414 Neoplasm of uncertain behavior of bladder: Secondary | ICD-10-CM | POA: Diagnosis not present

## 2020-11-13 DIAGNOSIS — Z87891 Personal history of nicotine dependence: Secondary | ICD-10-CM | POA: Diagnosis not present

## 2020-11-13 DIAGNOSIS — N99820 Postprocedural hemorrhage and hematoma of a genitourinary system organ or structure following a genitourinary system procedure: Secondary | ICD-10-CM | POA: Diagnosis not present

## 2020-11-13 DIAGNOSIS — D494 Neoplasm of unspecified behavior of bladder: Secondary | ICD-10-CM | POA: Diagnosis not present

## 2020-11-13 DIAGNOSIS — C679 Malignant neoplasm of bladder, unspecified: Secondary | ICD-10-CM | POA: Diagnosis not present

## 2020-11-13 DIAGNOSIS — R58 Hemorrhage, not elsewhere classified: Secondary | ICD-10-CM | POA: Diagnosis present

## 2020-11-13 DIAGNOSIS — C678 Malignant neoplasm of overlapping sites of bladder: Secondary | ICD-10-CM | POA: Diagnosis not present

## 2020-11-13 DIAGNOSIS — R310 Gross hematuria: Secondary | ICD-10-CM | POA: Diagnosis not present

## 2020-11-13 DIAGNOSIS — I10 Essential (primary) hypertension: Secondary | ICD-10-CM | POA: Diagnosis not present

## 2020-11-13 HISTORY — DX: Essential (primary) hypertension: I10

## 2020-11-13 HISTORY — DX: Other complications of anesthesia, initial encounter: T88.59XA

## 2020-11-13 HISTORY — DX: Pure hypercholesterolemia, unspecified: E78.00

## 2020-11-13 LAB — HEMOGLOBIN AND HEMATOCRIT, BLOOD
HCT: 35.2 % — ABNORMAL LOW (ref 39.0–52.0)
Hemoglobin: 11.9 g/dL — ABNORMAL LOW (ref 13.0–17.0)

## 2020-11-13 MED ORDER — TRAMADOL HCL 50 MG PO TABS: 50.0000 mg | ORAL_TABLET | Freq: Four times a day (QID) | ORAL | Status: DC | PRN

## 2020-11-13 MED ORDER — BELLADONNA ALKALOIDS-OPIUM 16.2-60 MG RE SUPP: 1.0000 | Freq: Three times a day (TID) | RECTAL | Status: DC | PRN

## 2020-11-13 MED ORDER — BACITRACIN-NEOMYCIN-POLYMYXIN 400-5-5000 EX OINT: 1.0000 "application " | TOPICAL_OINTMENT | Freq: Three times a day (TID) | CUTANEOUS | Status: DC | PRN

## 2020-11-13 MED ORDER — DIPHENHYDRAMINE HCL 12.5 MG/5ML PO ELIX: 12.5000 mg | ORAL_SOLUTION | Freq: Four times a day (QID) | ORAL | Status: DC | PRN

## 2020-11-13 MED ORDER — ONDANSETRON HCL 4 MG/2ML IJ SOLN: 4.0000 mg | INTRAMUSCULAR | Status: DC | PRN

## 2020-11-13 MED ORDER — ACETAMINOPHEN 325 MG PO TABS: 650.0000 mg | ORAL_TABLET | ORAL | Status: DC | PRN

## 2020-11-13 MED ORDER — DEXTROSE-NACL 5-0.45 % IV SOLN: INTRAVENOUS | Status: DC

## 2020-11-13 MED ORDER — HYDROMORPHONE HCL 1 MG/ML IJ SOLN: 0.5000 mg | INTRAMUSCULAR | Status: DC | PRN

## 2020-11-13 MED ORDER — SODIUM CHLORIDE 0.9 % IR SOLN
3000.0000 mL | Status: DC
  Administered 2020-11-13 – 2020-11-14 (×6): 3000 mL

## 2020-11-13 MED ORDER — SODIUM CHLORIDE 0.9 % IV SOLN: INTRAVENOUS | Status: DC

## 2020-11-13 MED ORDER — DIPHENHYDRAMINE HCL 50 MG/ML IJ SOLN: 12.5000 mg | Freq: Four times a day (QID) | INTRAMUSCULAR | Status: DC | PRN

## 2020-11-13 NOTE — H&P (Signed)
Cc: Gross hematuria  History of present illness: 70 year old male who has a known bladder mass, who was taken to the operating room today on elective basis at the outpatient surgery center for a TURBT and bilateral retrograde pyelograms.  The tumor was quite large and broad-based.  It was located on the right lateral wall of the bladder.  The resection was largely uncomplicated, but notably incomplete due to the size of the tumor and the depth of the invasion.  Hemostasis was fairly well controlled at the end of the case, and a 58 French Foley catheter was placed.  He was placed on continuous bladder irrigation and returned to the PACU in stable condition.  In the PACU, he continued to have gross hematuria, which progressively worsened despite our best efforts at irrigating his bladder and keeping him on continuous bladder irrigation.  He was having significant spasm as well.  In addition, the patient was having labile blood pressures and complaining of hot flashes, nausea, and bladder pain.  Given his significant hematuria and clot, I recommended that the patient be directly admitted to [facility] for closer observation.  The patient has a past medical history of hypertension.  He also has recently been treated for chronic prostatitis.  He is an otherwise reasonably good shape.  Review of systems: A 12 point comprehensive review of systems was obtained and is negative unless otherwise stated in the history of present illness.  Patient Active Problem List   Diagnosis Date Noted  . Bleeding 11/13/2020    No current facility-administered medications on file prior to encounter.   No current outpatient medications on file prior to encounter.    Past Medical History:  Diagnosis Date  . Complication of anesthesia    vomiting only with ether  . High cholesterol   . Hypertension     Past Surgical History:  Procedure Laterality Date  . APPENDECTOMY    . ROTATOR CUFF REPAIR Bilateral      Social History   Tobacco Use  . Smoking status: Former Smoker    Packs/day: 3.50    Years: 20.00    Pack years: 70.00    Types: Cigarettes    Quit date: 11/14/1990    Years since quitting: 30.0  . Smokeless tobacco: Never Used  Vaping Use  . Vaping Use: Never used  Substance Use Topics  . Alcohol use: Yes    Comment: 3 cans beer daily  . Drug use: Never    History reviewed. No pertinent family history.  PE: Vitals:   11/13/20 1855  BP: 133/76  Pulse: 67  Resp: 18  Temp: 98 F (36.7 C)  TempSrc: Oral  SpO2: 98%  Weight: 107.8 kg  Height: 6' (1.829 m)   Patient appears to be in no acute distress  patient is alert and oriented x3 Atraumatic normocephalic head No cervical or supraclavicular lymphadenopathy appreciated No increased work of breathing, no audible wheezes/rhonchi Regular sinus rhythm/rate Abdomen is soft, nontender, nondistended, no CVA or suprapubic tenderness Lower extremities are symmetric without appreciable edema Currently, the Foley catheter is in place and draining pink urine on a very slow CBI Grossly neurologically intact No identifiable skin lesions  Recent Labs    11/13/20 2037  HGB 11.9*  HCT 35.2*   No results for input(s): NA, K, CL, CO2, GLUCOSE, BUN, CREATININE, CALCIUM in the last 72 hours. No results for input(s): LABPT, INR in the last 72 hours. No results for input(s): LABURIN in the last 72 hours. No  results found for this or any previous visit.  Imaging: none  Imp: Gross hematuria secondary to a large TURBT with bleeding from the bladder tumor base.  He is now stabilized.  His blood pressure has also stabilized and is no longer labile.  The bleeding is significantly improved on a very gentle CBI.  He is starting to feel better and tolerating food.  Recommendations: Our plan is to admit the patient for overnight observation.  We will continue with continuous bladder irrigation, wean off as able.  In addition, we will make  him n.p.o. in the event that things worsen overnight and he does require fulguration of the area is bleeding in his bladder.  We will keep him on telemetry.  We will give him normal saline for IV fluids.  We will use BNO suppositories as needed for pain control.    Ardis Hughs

## 2020-11-14 DIAGNOSIS — N99820 Postprocedural hemorrhage and hematoma of a genitourinary system organ or structure following a genitourinary system procedure: Secondary | ICD-10-CM | POA: Diagnosis not present

## 2020-11-14 DIAGNOSIS — D414 Neoplasm of uncertain behavior of bladder: Secondary | ICD-10-CM | POA: Diagnosis not present

## 2020-11-14 DIAGNOSIS — I10 Essential (primary) hypertension: Secondary | ICD-10-CM | POA: Diagnosis not present

## 2020-11-14 DIAGNOSIS — Z87891 Personal history of nicotine dependence: Secondary | ICD-10-CM | POA: Diagnosis not present

## 2020-11-14 DIAGNOSIS — R310 Gross hematuria: Secondary | ICD-10-CM | POA: Diagnosis not present

## 2020-11-14 LAB — BASIC METABOLIC PANEL
Anion gap: 7 (ref 5–15)
BUN: 20 mg/dL (ref 8–23)
CO2: 26 mmol/L (ref 22–32)
Calcium: 8.3 mg/dL — ABNORMAL LOW (ref 8.9–10.3)
Chloride: 101 mmol/L (ref 98–111)
Creatinine, Ser: 0.95 mg/dL (ref 0.61–1.24)
GFR, Estimated: >60 mL/min (ref >60)
Glucose, Bld: 212 mg/dL — ABNORMAL HIGH (ref 70–99)
Potassium: 4.2 mmol/L (ref 3.5–5.1)
Sodium: 134 mmol/L — ABNORMAL LOW (ref 135–145)

## 2020-11-14 LAB — HEMOGLOBIN AND HEMATOCRIT, BLOOD
HCT: 30.1 % — ABNORMAL LOW (ref 39.0–52.0)
Hemoglobin: 10.3 g/dL — ABNORMAL LOW (ref 13.0–17.0)

## 2020-11-14 LAB — HIV ANTIBODY (ROUTINE TESTING W REFLEX): HIV Screen 4th Generation wRfx: NONREACTIVE

## 2020-11-14 MED ORDER — TRAMADOL HCL 50 MG PO TABS: 50.0000 mg | ORAL_TABLET | Freq: Four times a day (QID) | ORAL | 0 refills | Status: DC | PRN

## 2020-11-14 MED ORDER — CEPHALEXIN 500 MG PO CAPS: 500.0000 mg | ORAL_CAPSULE | Freq: Three times a day (TID) | ORAL | 0 refills | Status: AC

## 2020-11-14 NOTE — Discharge Summary (Signed)
Date of admission: 11/13/2020  Date of discharge: 11/14/2020  Admission diagnosis: gross hematuria  Discharge diagnosis: same  Secondary diagnoses:  Patient Active Problem List   Diagnosis Date Noted  . Bleeding 11/13/2020    Procedures performed:   History and Physical: For full details, please see admission history and physical. Briefly, Javier Sellers is a 70 y.o. year old patient with gross hematuria after a TURBT at the surgery center.  He developed significant bleeding.   Hospital Course: the patient was admitted to and his bladder was irrigated by hand to get it to flow.  It was then weaned over the course of the night.  On HOD#2 the urine was clear on a slow drip.  The CBI was stopped and it remained pink. His hospital course was uncomplicated.  On POD#1 he had met discharge criteria: was eating a regular diet, was up and ambulating independently,  pain was well controlled, and was ready to for discharge.   Laboratory values:  Recent Labs    11/13/20 2037 11/14/20 0330  HGB 11.9* 10.3*  HCT 35.2* 30.1*   Recent Labs    11/14/20 0330  NA 134*  K 4.2  CL 101  CO2 26  GLUCOSE 212*  BUN 20  CREATININE 0.95  CALCIUM 8.3*   No results for input(s): LABPT, INR in the last 72 hours. No results for input(s): LABURIN in the last 72 hours. No results found for this or any previous visit.  Disposition: Home  Discharge instruction: The patient was instructed to be ambulatory but told to refrain from heavy lifting, strenuous activity, or driving.   Discharge medications:  Allergies as of 11/14/2020   No Known Allergies     Medication List    STOP taking these medications   meloxicam 15 MG tablet Commonly known as: MOBIC     TAKE these medications   amLODipine 5 MG tablet Commonly known as: NORVASC Take 1 tablet by mouth every morning.   cephALEXin 500 MG capsule Commonly known as: KEFLEX Take 1 capsule (500 mg total) by mouth 3 (three) times daily for 3 days.    lisinopril 40 MG tablet Commonly known as: ZESTRIL Take 40 mg by mouth every morning.   metoprolol succinate 100 MG 24 hr tablet Commonly known as: TOPROL-XL Take 100 mg by mouth every morning.   pravastatin 80 MG tablet Commonly known as: PRAVACHOL Take 80 mg by mouth at bedtime.   tamsulosin 0.4 MG Caps capsule Commonly known as: FLOMAX Take 0.4 mg by mouth daily.   traMADol 50 MG tablet Commonly known as: ULTRAM Take 1-2 tablets (50-100 mg total) by mouth every 6 (six) hours as needed for moderate pain.       Followup: [facility], 6/3 at 9:45 for catheter removal.

## 2020-11-14 NOTE — Discharge Planning (Signed)
Removed patient's IV and telemetry. Went over catheter care with the patient and answered all questions. Plan is for him to follow up in the office tomorrow for catheter removal.

## 2020-11-14 NOTE — Plan of Care (Signed)
  Problem: Education: Goal: Knowledge of the prescribed therapeutic regimen will improve Outcome: Adequate for Discharge

## 2020-11-14 NOTE — Discharge Instructions (Signed)
Transurethral Resection of Bladder Tumor (TURBT) or Bladder Biopsy   Definition:  Transurethral Resection of the Bladder Tumor is a surgical procedure used to diagnose and remove tumors within the bladder. TURBT is the most common treatment for early stage bladder cancer.  General instructions:     Your recent bladder surgery requires very little post hospital care but some definite precautions.  Despite the fact that no skin incisions were used, the area around the bladder incisions are raw and covered with scabs to promote healing and prevent bleeding. Certain precautions are needed to insure that the scabs are not disturbed over the next 2-4 weeks while the healing proceeds.  Because the raw surface inside your bladder and the irritating effects of urine you may expect frequency of urination and/or urgency (a stronger desire to urinate) and perhaps even getting up at night more often. This will usually resolve or improve slowly over the healing period. You may see some blood in your urine over the first 6 weeks. Do not be alarmed, even if the urine was clear for a while. Get off your feet and drink lots of fluids until clearing occurs. If you start to pass clots or don't improve call us.  Diet:  You may return to your normal diet immediately. Because of the raw surface of your bladder, alcohol, spicy foods, foods high in acid and drinks with caffeine may cause irritation or frequency and should be used in moderation. To keep your urine flowing freely and avoid constipation, drink plenty of fluids during the day (8-10 glasses). Tip: Avoid cranberry juice because it is very acidic.  Activity:  Your physical activity doesn't need to be restricted. However, if you are very active, you may see some blood in the urine. We suggest that you reduce your activity under the circumstances until the bleeding has stopped.  Bowels:  It is important to keep your bowels regular during the postoperative  period. Straining with bowel movements can cause bleeding. A bowel movement every other day is reasonable. Use a mild laxative if needed, such as milk of magnesia 2-3 tablespoons, or 2 Dulcolax tablets. Call if you continue to have problems. If you had been taking narcotics for pain, before, during or after your surgery, you may be constipated. Take a laxative if necessary.    Medication:  You should resume your pre-surgery medications unless told not to. In addition you may be given an antibiotic to prevent or treat infection. Antibiotics are not always necessary. All medication should be taken as prescribed until the bottles are finished unless you are having an unusual reaction to one of the drugs.     Foley Catheter Care A soft, flexible tube (Foley catheter) may have been placed in your bladder to drain urine and fluid. Follow these instructions: Taking Care of the Catheter  Keep the area where the catheter leaves your body clean.   Attach the catheter to the leg so there is no tension on the catheter.   Keep the drainage bag below the level of the bladder, but keep it OFF the floor.   Do not take long soaking baths. Your caregiver will give instructions about showering.   Wash your hands before touching ANYTHING related to the catheter or bag.   Using mild soap and warm water on a washcloth:   Clean the area closest to the catheter insertion site using a circular motion around the catheter.   Clean the catheter itself by wiping AWAY from the insertion  site for several inches down the tube.   NEVER wipe upward as this could sweep bacteria up into the urethra (tube in your body that normally drains the bladder) and cause infection.   Place a small amount of sterile lubricant at the tip of the penis where the catheter is entering.  Taking Care of the Drainage Bags  Two drainage bags may be taken home: a large overnight drainage bag, and a smaller leg bag which fits underneath  clothing.   It is okay to wear the overnight bag at any time, but NEVER wear the smaller leg bag at night.   Keep the drainage bag well below the level of your bladder. This prevents backflow of urine into the bladder and allows the urine to drain freely.   Anchor the tubing to your leg to prevent pulling or tension on the catheter. Use tape or a leg strap provided by the hospital.   Empty the drainage bag when it is 1/2 to 3/4 full. Wash your hands before and after touching the bag.   Periodically check the tubing for kinks to make sure there is no pressure on the tubing which could restrict the flow of urine.  Changing the Drainage Bags  Cleanse both ends of the clean bag with alcohol before changing.   Pinch off the rubber catheter to avoid urine spillage during the disconnection.   Disconnect the dirty bag and connect the clean one.   Empty the dirty bag carefully to avoid a urine spill.   Attach the new bag to the leg with tape or a leg strap.  Cleaning the Drainage Bags  Whenever a drainage bag is disconnected, it must be cleaned quickly so it is ready for the next use.   Wash the bag in warm, soapy water.   Rinse the bag thoroughly with warm water.   Soak the bag for 30 minutes in a solution of white vinegar and water (1 cup vinegar to 1 quart warm water).   Rinse with warm water.  SEEK MEDICAL CARE IF:   You have chills or night sweats.   You are leaking around your catheter or have problems with your catheter. It is not uncommon to have sporadic leakage around your catheter as a result of bladder spasms. If the leakage stops, there is not much need for concern. If you are uncertain, call your caregiver.   You develop side effects that you think are coming from your medicines.  SEEK IMMEDIATE MEDICAL CARE IF:   You are suddenly unable to urinate. Check to see if there are any kinks in the drainage tubing that may cause this. If you cannot find any kinks, call your  caregiver immediately. This is an emergency.   You develop shortness of breath or chest pains.   Bleeding persists or clots develop in your urine.   You have a fever.   You develop pain in your back or over your lower belly (abdomen).   You develop pain or swelling in your legs.   Any problems you are having get worse rather than better.  MAKE SURE YOU:   Understand these instructions.   Will watch your condition.   Will get help right away if you are not doing well or get worse.

## 2020-11-14 NOTE — Progress Notes (Signed)
Urology Inpatient Progress Report  Bleeding [R58]        Intv/Subj: No acute events overnight. Patient is without complaint.  Active Problems:   Bleeding  Current Facility-Administered Medications  Medication Dose Route Frequency Provider Last Rate Last Admin  . 0.9 %  sodium chloride infusion   Intravenous Continuous Ardis Hughs, MD   Stopped at 11/13/20 2118  . acetaminophen (TYLENOL) tablet 650 mg  650 mg Oral Q4H PRN Debbrah Alar, PA-C      . belladonna-opium (B&O) suppository 16.2-60mg   1 suppository Rectal Q8H PRN Ardis Hughs, MD      . dextrose 5 %-0.45 % sodium chloride infusion   Intravenous Continuous Debbrah Alar, PA-C 75 mL/hr at 11/13/20 2118 New Bag at 11/13/20 2118  . diphenhydrAMINE (BENADRYL) injection 12.5 mg  12.5 mg Intravenous Q6H PRN Dancy, Amanda, PA-C       Or  . diphenhydrAMINE (BENADRYL) 12.5 MG/5ML elixir 12.5 mg  12.5 mg Oral Q6H PRN Debbrah Alar, PA-C      . HYDROmorphone (DILAUDID) injection 0.5-1 mg  0.5-1 mg Intravenous Q2H PRN Debbrah Alar, PA-C      . neomycin-bacitracin-polymyxin (NEOSPORIN) ointment packet 1 application  1 application Topical TID PRN Debbrah Alar, PA-C      . ondansetron (ZOFRAN) injection 4 mg  4 mg Intravenous Q4H PRN Dancy, Amanda, PA-C      . sodium chloride irrigation 0.9 % 3,000 mL  3,000 mL Irrigation Continuous Ardis Hughs, MD   3,000 mL at 11/14/20 0510  . traMADol (ULTRAM) tablet 50-100 mg  50-100 mg Oral Q6H PRN Debbrah Alar, PA-C         Objective: Vital: Vitals:   11/13/20 1855 11/13/20 2235 11/14/20 0154 11/14/20 0523  BP: 133/76 125/65 120/61 110/60  Pulse: 67 63 61 66  Resp: 18 16 20 18   Temp: 98 F (36.7 C) 98.3 F (36.8 C) 98.5 F (36.9 C) 98.2 F (36.8 C)  TempSrc: Oral Oral Oral Oral  SpO2: 98% 95% 98% 98%  Weight: 107.8 kg     Height: 6' (1.829 m)      I/Os: I/O last 3 completed shifts: In: 12954.1 [I.V.:954.1; Other:12000] Out: 14200 [Urine:14200]  Physical  Exam:  General: Patient is in no apparent distress Lungs: Normal respiratory effort, chest expands symmetrically. GI:   The abdomen is soft and nontender without mass. Foley: clear on gentle CBI  Ext: lower extremities symmetric  Lab Results: Recent Labs    11/13/20 2037 11/14/20 0330  HGB 11.9* 10.3*  HCT 35.2* 30.1*   Recent Labs    11/14/20 0330  NA 134*  K 4.2  CL 101  CO2 26  GLUCOSE 212*  BUN 20  CREATININE 0.95  CALCIUM 8.3*   No results for input(s): LABPT, INR in the last 72 hours. No results for input(s): LABURIN in the last 72 hours. No results found for this or any previous visit.  Studies/Results: No results found.  Assessment: S/p TURBT for large bladder tumor, admitted for gross hematuria.  Doing well this AM with minimal on-going bleeding.  Labs are as expected, but adequate and do not require transfusion.  He is stable and likely ready for discharge.  Plan: ADAT Stop CBI HLIVF Plan for discharge this morning   Louis Meckel, MD Urology 11/14/2020, 7:30 AM

## 2020-11-15 DIAGNOSIS — R310 Gross hematuria: Secondary | ICD-10-CM | POA: Diagnosis not present

## 2020-11-26 DIAGNOSIS — C672 Malignant neoplasm of lateral wall of bladder: Secondary | ICD-10-CM | POA: Diagnosis not present

## 2020-11-26 DIAGNOSIS — C679 Malignant neoplasm of bladder, unspecified: Secondary | ICD-10-CM | POA: Diagnosis not present

## 2020-11-26 DIAGNOSIS — J432 Centrilobular emphysema: Secondary | ICD-10-CM | POA: Diagnosis not present

## 2020-11-27 ENCOUNTER — Telehealth: Payer: Self-pay | Admitting: Internal Medicine

## 2020-11-27 DIAGNOSIS — R918 Other nonspecific abnormal finding of lung field: Secondary | ICD-10-CM

## 2020-11-27 NOTE — Telephone Encounter (Signed)
   Call from Urology office Dr [facility]    - LUL mass 2.3cm, spiculated lesion. Patient also has muscle invasive bladder cancer, former smoer. Needs chemo first for bladder and then surgery.,  Apparently bladder caner has small cel lf eaturs  He is going to be seeing dr Barbaraann Faster soon  A - High prob for lung cancer  Plan  - do PET whol body  ASAP - see Icard/Byrum ASAP  - PFFT asap  New pulm consult

## 2020-11-27 NOTE — Telephone Encounter (Signed)
I have called and spoke with pts wife and she is aware of the following appts scheduled   OV with BI on 06/27 at 9--ok to double book per BI PFT on 06/27 at 12.  Pts wife stated that this appt was ok.   Order for the PET has been placed STAT

## 2020-12-02 ENCOUNTER — Telehealth: Payer: Self-pay | Admitting: Oncology

## 2020-12-02 NOTE — Telephone Encounter (Signed)
Received a new pt referral from Dr. Louis Meckel for a dx of bladder cancer. Mr. Javier Sellers has been cld and scheduled to see Dr. Alen Blew on 7/1 at 11am per pt's request. He's aware to arrive 20 minutes early.

## 2020-12-06 ENCOUNTER — Other Ambulatory Visit: Payer: Self-pay

## 2020-12-06 ENCOUNTER — Telehealth: Payer: Self-pay | Admitting: Internal Medicine

## 2020-12-06 DIAGNOSIS — R918 Other nonspecific abnormal finding of lung field: Secondary | ICD-10-CM

## 2020-12-06 NOTE — Telephone Encounter (Signed)
ATC LVMTCB X1. If pt calls back please remind him to bring Covid vaccination card into office when he comes on Monday December 09, 2020.

## 2020-12-09 ENCOUNTER — Encounter: Payer: Self-pay | Admitting: Pulmonary Disease

## 2020-12-09 ENCOUNTER — Telehealth: Payer: Self-pay | Admitting: Pulmonary Disease

## 2020-12-09 ENCOUNTER — Ambulatory Visit (INDEPENDENT_AMBULATORY_CARE_PROVIDER_SITE_OTHER): Payer: Medicare Other | Admitting: Pulmonary Disease

## 2020-12-09 ENCOUNTER — Other Ambulatory Visit: Payer: Self-pay

## 2020-12-09 VITALS — BP 140/82 | HR 60 | Temp 98.0°F | Ht 71.0 in | Wt 269.5 lb

## 2020-12-09 DIAGNOSIS — C679 Malignant neoplasm of bladder, unspecified: Secondary | ICD-10-CM | POA: Diagnosis not present

## 2020-12-09 DIAGNOSIS — Z87891 Personal history of nicotine dependence: Secondary | ICD-10-CM | POA: Diagnosis not present

## 2020-12-09 DIAGNOSIS — R918 Other nonspecific abnormal finding of lung field: Secondary | ICD-10-CM | POA: Diagnosis not present

## 2020-12-09 DIAGNOSIS — R911 Solitary pulmonary nodule: Secondary | ICD-10-CM

## 2020-12-09 LAB — PULMONARY FUNCTION TEST
DL/VA % pred: 108 %
DL/VA: 4.41 ml/min/mmHg/L
DLCO cor % pred: 113 %
DLCO cor: 30.14 ml/min/mmHg
DLCO unc % pred: 96 %
DLCO unc: 25.72 ml/min/mmHg
FEF 25-75 Post: 2.16 L/sec
FEF 25-75 Pre: 1.77 L/sec
FEF2575-%Change-Post: 21 %
FEF2575-%Pred-Post: 82 %
FEF2575-%Pred-Pre: 67 %
FEV1-%Change-Post: 5 %
FEV1-%Pred-Post: 81 %
FEV1-%Pred-Pre: 76 %
FEV1-Post: 2.75 L
FEV1-Pre: 2.6 L
FEV1FVC-%Change-Post: -1 %
FEV1FVC-%Pred-Pre: 96 %
FEV6-%Change-Post: 6 %
FEV6-%Pred-Post: 88 %
FEV6-%Pred-Pre: 83 %
FEV6-Post: 3.85 L
FEV6-Pre: 3.61 L
FEV6FVC-%Change-Post: -1 %
FEV6FVC-%Pred-Post: 103 %
FEV6FVC-%Pred-Pre: 104 %
FVC-%Change-Post: 7 %
FVC-%Pred-Post: 85 %
FVC-%Pred-Pre: 79 %
FVC-Post: 3.93 L
FVC-Pre: 3.65 L
Post FEV1/FVC ratio: 70 %
Post FEV6/FVC ratio: 98 %
Pre FEV1/FVC ratio: 71 %
Pre FEV6/FVC Ratio: 99 %
RV % pred: 90 %
RV: 2.21 L
TLC % pred: 86 %
TLC: 6.15 L

## 2020-12-09 NOTE — Patient Instructions (Addendum)
Thank you for visiting Dr. Valeta Harms at [facility]. Today we recommend the following:  Orders Placed This Encounter  Procedures   NM PET Image Initial (PI) Skull Base To Thigh   CT Super D Chest Wo Contrast   Ambulatory referral to Pulmonology   Schedule NM PET next available  Bronchoscopy on 12/20/2020  Return in about 4 weeks (around 01/06/2021) for with APP or Dr. Valeta Harms.    Please do your part to reduce the spread of COVID-19.

## 2020-12-09 NOTE — Progress Notes (Signed)
PFT done today. 

## 2020-12-09 NOTE — H&P (View-Only) (Signed)
Synopsis: Referred in June 2022 for lung nodule by [name], MD  Subjective:   PATIENT ID: Javier Sellers GENDER: male DOB: 1950/12/03, MRN: [id number]  Chief Complaint  Patient presents with   Consult    LUL nodule.  SHOB with exertion.      70 year old gentleman, past medical history of high cholesterol, hypertension, former smoker quit 1992, 70-pack-year history.Presents today for evaluation of abnormal imaging.Patient had follow-up with urology, muscle invasive bladder cancer.  Bladder pathology had small cell features.  Appointment scheduled with Dr. Alen Blew from medical oncology.  Found to have a 2.3 cm spiculated lesion within the left upper lobe this was concerning for a primary lung malignancy.  CT scan of the chest was completed at [facility].  CT imaging is viewable in PACS system.  2.3 x 1.6 cm left lung apical, medial spiculated nodule concerning for primary bronchogenic carcinoma.  Patient already has a PET scan that has been ordered but not completed yet.  From a respiratory standpoint he is doing well today.  Overall anxious regarding recent diagnosis and abnormal CT imaging results.    Past Medical History:  Diagnosis Date   Complication of anesthesia    vomiting only with ether   High cholesterol    Hypertension      No family history on file.   Past Surgical History:  Procedure Laterality Date   APPENDECTOMY     ROTATOR CUFF REPAIR Bilateral     Social History   Socioeconomic History   Marital status: Married    Spouse name: Not on file   Number of children: Not on file   Years of education: Not on file   Highest education level: Not on file  Occupational History   Not on file  Tobacco Use   Smoking status: Former    Packs/day: 3.50    Years: 20.00    Pack years: 70.00    Types: Cigarettes    Quit date: 11/14/1990    Years since quitting: 30.0   Smokeless tobacco: Never  Vaping Use   Vaping Use: Never used  Substance and Sexual  Activity   Alcohol use: Yes    Comment: 3 cans beer daily   Drug use: Never   Sexual activity: Not on file  Other Topics Concern   Not on file  Social History Narrative   Not on file   Social Determinants of Health   Financial Resource Strain: Not on file  Food Insecurity: Not on file  Transportation Needs: Not on file  Physical Activity: Not on file  Stress: Not on file  Social Connections: Not on file  Intimate Partner Violence: Not on file     No Known Allergies   Outpatient Medications Prior to Visit  Medication Sig Dispense Refill   amLODipine (NORVASC) 5 MG tablet Take 1 tablet by mouth every morning.     lisinopril (ZESTRIL) 40 MG tablet Take 40 mg by mouth every morning.     metoprolol succinate (TOPROL-XL) 100 MG 24 hr tablet Take 100 mg by mouth every morning.     pravastatin (PRAVACHOL) 80 MG tablet Take 80 mg by mouth at bedtime.     tamsulosin (FLOMAX) 0.4 MG CAPS capsule Take 0.4 mg by mouth daily.     traMADol (ULTRAM) 50 MG tablet Take 1-2 tablets (50-100 mg total) by mouth every 6 (six) hours as needed for moderate pain. (Patient not taking: Reported on 12/09/2020) 15 tablet 0   No facility-administered  medications prior to visit.    Review of Systems  Constitutional:  Negative for chills, fever, malaise/fatigue and weight loss.  HENT:  Negative for hearing loss, sore throat and tinnitus.   Eyes:  Negative for blurred vision and double vision.  Respiratory:  Negative for cough, hemoptysis, sputum production, shortness of breath, wheezing and stridor.   Cardiovascular:  Negative for chest pain, palpitations, orthopnea, leg swelling and PND.  Gastrointestinal:  Negative for abdominal pain, constipation, diarrhea, heartburn, nausea and vomiting.  Genitourinary:  Negative for dysuria, hematuria and urgency.  Musculoskeletal:  Negative for joint pain and myalgias.  Skin:  Negative for itching and rash.  Neurological:  Negative for dizziness, tingling, weakness  and headaches.  Endo/Heme/Allergies:  Negative for environmental allergies. Does not bruise/bleed easily.  Psychiatric/Behavioral:  Negative for depression. The patient is nervous/anxious. The patient does not have insomnia.   All other systems reviewed and are negative.   Objective:  Physical Exam Vitals reviewed.  Constitutional:      General: He is not in acute distress.    Appearance: He is well-developed. He is obese.  HENT:     Head: Normocephalic and atraumatic.  Eyes:     General: No scleral icterus.    Conjunctiva/sclera: Conjunctivae normal.     Pupils: Pupils are equal, round, and reactive to light.  Neck:     Vascular: No JVD.     Trachea: No tracheal deviation.  Cardiovascular:     Rate and Rhythm: Normal rate and regular rhythm.     Heart sounds: Normal heart sounds. No murmur heard. Pulmonary:     Effort: Pulmonary effort is normal. No tachypnea, accessory muscle usage or respiratory distress.     Breath sounds: Normal breath sounds. No stridor. No wheezing, rhonchi or rales.  Abdominal:     General: Bowel sounds are normal. There is no distension.     Palpations: Abdomen is soft.     Tenderness: There is no abdominal tenderness.  Musculoskeletal:        General: No tenderness.     Cervical back: Neck supple.  Lymphadenopathy:     Cervical: No cervical adenopathy.  Skin:    General: Skin is warm and dry.     Capillary Refill: Capillary refill takes less than 2 seconds.     Findings: No rash.  Neurological:     Mental Status: He is alert and oriented to person, place, and time.  Psychiatric:        Behavior: Behavior normal.     Vitals:   12/09/20 0853  BP: 140/82  Pulse: 60  Temp: 98 F (36.7 C)  TempSrc: Oral  SpO2: 99%  Weight: 269 lb 8 oz (122.2 kg)  Height: 5\' 11"  (1.803 m)   99% on RA BMI Readings from Last 3 Encounters:  12/09/20 37.59 kg/m  11/13/20 32.24 kg/m   Wt Readings from Last 3 Encounters:  12/09/20 269 lb 8 oz (122.2 kg)   11/13/20 237 lb 11.2 oz (107.8 kg)     CBC    Component Value Date/Time   HGB 10.3 (L) 11/14/2020 0330   HCT 30.1 (L) 11/14/2020 0330     Chest Imaging: June 2022 CT chest: 2.3 x 1.6 spiculated left upper lobe nodule concerning for primary bronchogenic carcinoma. The patient's images have been independently reviewed by me.    Pulmonary Functions Testing Results: No flowsheet data found.  FeNO:   Pathology:   Echocardiogram:   Heart Catheterization:     Assessment &  Plan:     ICD-10-CM   1. Left upper lobe pulmonary nodule  R91.1 NM PET Image Initial (PI) Skull Base To Thigh    CT Super D Chest Wo Contrast    Ambulatory referral to Pulmonology    2. Malignant neoplasm of urinary bladder, unspecified site (HCC)  C67.9 NM PET Image Initial (PI) Skull Base To Thigh    CT Super D Chest Wo Contrast    Ambulatory referral to Pulmonology    3. Former smoker  Z87.891       Discussion: This is a 70 year old gentleman, former smoker quit 1992.  Recent diagnosis of muscle invasive bladder cancer by urology.  He had small cell features and he has been referred to medical oncology.  He establishes care with Dr. Alen Blew this Friday.  We reviewed patient's CT imaging today with a left upper lobe pulmonary nodule with concerning features for potential underlying malignancy.  Based on location and features I suspect we are dealing with an additional primary lung cancer in conjunction with his bladder cancer however this cannot be excluded until we obtain biopsy.  Plan: Discussed risk benefits and alternatives of proceeding with navigational bronchoscopy, tissue sampling as well as possible fiducial placement. We discussed the risk of bleeding and pneumothorax. Patient is agreeable to proceed next available time slot. Patient has a nuclear medicine PET scan that has been ordered.  We need to work to get this scheduled. I would like to have a nuclear medicine pet image scheduled  before the bronchoscopy if possible.  However would not want to delay. Patient will need a noncontrasted CT scan of the chest completed for super D formatting.  We discussed all of this today in the office.  He has PFTs scheduled for this afternoon.     Current Outpatient Medications:    amLODipine (NORVASC) 5 MG tablet, Take 1 tablet by mouth every morning., Disp: , Rfl:    lisinopril (ZESTRIL) 40 MG tablet, Take 40 mg by mouth every morning., Disp: , Rfl:    metoprolol succinate (TOPROL-XL) 100 MG 24 hr tablet, Take 100 mg by mouth every morning., Disp: , Rfl:    pravastatin (PRAVACHOL) 80 MG tablet, Take 80 mg by mouth at bedtime., Disp: , Rfl:    tamsulosin (FLOMAX) 0.4 MG CAPS capsule, Take 0.4 mg by mouth daily., Disp: , Rfl:   I spent 62 minutes dedicated to the care of this patient on the date of this encounter to include pre-visit review of records, face-to-face time with the patient discussing conditions above, post visit ordering of testing, clinical documentation with the electronic health record, making appropriate referrals as documented, and communicating necessary findings to members of the patients care team.   Garner Nash, DO [facility] 12/09/2020 9:09 AM

## 2020-12-09 NOTE — Progress Notes (Signed)
Synopsis: Referred in June 2022 for lung nodule by [name], MD  Subjective:   PATIENT ID: Javier Sellers GENDER: male DOB: 28-Aug-1950, MRN: [id number]  Chief Complaint  Patient presents with   Consult    LUL nodule.  SHOB with exertion.      70 year old gentleman, past medical history of high cholesterol, hypertension, former smoker quit 1992, 70-pack-year history.Presents today for evaluation of abnormal imaging.Patient had follow-up with urology, muscle invasive bladder cancer.  Bladder pathology had small cell features.  Appointment scheduled with Dr. Alen Blew from medical oncology.  Found to have a 2.3 cm spiculated lesion within the left upper lobe this was concerning for a primary lung malignancy.  CT scan of the chest was [facility] at [facility].  CT imaging is viewable in PACS system.  2.3 x 1.6 cm left lung apical, medial spiculated nodule concerning for primary bronchogenic carcinoma.  Patient already has a PET scan that has been ordered but not completed yet.  From a respiratory standpoint he is doing well today.  Overall anxious regarding recent diagnosis and abnormal CT imaging results.    Past Medical History:  Diagnosis Date   Complication of anesthesia    vomiting only with ether   High cholesterol    Hypertension      No family history on file.   Past Surgical History:  Procedure Laterality Date   APPENDECTOMY     ROTATOR CUFF REPAIR Bilateral     Social History   Socioeconomic History   Marital status: Married    Spouse name: Not on file   Number of children: Not on file   Years of education: Not on file   Highest education level: Not on file  Occupational History   Not on file  Tobacco Use   Smoking status: Former    Packs/day: 3.50    Years: 20.00    Pack years: 70.00    Types: Cigarettes    Quit date: 11/14/1990    Years since quitting: 30.0   Smokeless tobacco: Never  Vaping Use   Vaping Use: Never used  Substance and Sexual  Activity   Alcohol use: Yes    Comment: 3 cans beer daily   Drug use: Never   Sexual activity: Not on file  Other Topics Concern   Not on file  Social History Narrative   Not on file   Social Determinants of Health   Financial Resource Strain: Not on file  Food Insecurity: Not on file  Transportation Needs: Not on file  Physical Activity: Not on file  Stress: Not on file  Social Connections: Not on file  Intimate Partner Violence: Not on file     No Known Allergies   Outpatient Medications Prior to Visit  Medication Sig Dispense Refill   amLODipine (NORVASC) 5 MG tablet Take 1 tablet by mouth every morning.     lisinopril (ZESTRIL) 40 MG tablet Take 40 mg by mouth every morning.     metoprolol succinate (TOPROL-XL) 100 MG 24 hr tablet Take 100 mg by mouth every morning.     pravastatin (PRAVACHOL) 80 MG tablet Take 80 mg by mouth at bedtime.     tamsulosin (FLOMAX) 0.4 MG CAPS capsule Take 0.4 mg by mouth daily.     traMADol (ULTRAM) 50 MG tablet Take 1-2 tablets (50-100 mg total) by mouth every 6 (six) hours as needed for moderate pain. (Patient not taking: Reported on 12/09/2020) 15 tablet 0   No facility-administered  medications prior to visit.    Review of Systems  Constitutional:  Negative for chills, fever, malaise/fatigue and weight loss.  HENT:  Negative for hearing loss, sore throat and tinnitus.   Eyes:  Negative for blurred vision and double vision.  Respiratory:  Negative for cough, hemoptysis, sputum production, shortness of breath, wheezing and stridor.   Cardiovascular:  Negative for chest pain, palpitations, orthopnea, leg swelling and PND.  Gastrointestinal:  Negative for abdominal pain, constipation, diarrhea, heartburn, nausea and vomiting.  Genitourinary:  Negative for dysuria, hematuria and urgency.  Musculoskeletal:  Negative for joint pain and myalgias.  Skin:  Negative for itching and rash.  Neurological:  Negative for dizziness, tingling, weakness  and headaches.  Endo/Heme/Allergies:  Negative for environmental allergies. Does not bruise/bleed easily.  Psychiatric/Behavioral:  Negative for depression. The patient is nervous/anxious. The patient does not have insomnia.   All other systems reviewed and are negative.   Objective:  Physical Exam Vitals reviewed.  Constitutional:      General: He is not in acute distress.    Appearance: He is well-developed. He is obese.  HENT:     Head: Normocephalic and atraumatic.  Eyes:     General: No scleral icterus.    Conjunctiva/sclera: Conjunctivae normal.     Pupils: Pupils are equal, round, and reactive to light.  Neck:     Vascular: No JVD.     Trachea: No tracheal deviation.  Cardiovascular:     Rate and Rhythm: Normal rate and regular rhythm.     Heart sounds: Normal heart sounds. No murmur heard. Pulmonary:     Effort: Pulmonary effort is normal. No tachypnea, accessory muscle usage or respiratory distress.     Breath sounds: Normal breath sounds. No stridor. No wheezing, rhonchi or rales.  Abdominal:     General: Bowel sounds are normal. There is no distension.     Palpations: Abdomen is soft.     Tenderness: There is no abdominal tenderness.  Musculoskeletal:        General: No tenderness.     Cervical back: Neck supple.  Lymphadenopathy:     Cervical: No cervical adenopathy.  Skin:    General: Skin is warm and dry.     Capillary Refill: Capillary refill takes less than 2 seconds.     Findings: No rash.  Neurological:     Mental Status: He is alert and oriented to person, place, and time.  Psychiatric:        Behavior: Behavior normal.     Vitals:   12/09/20 0853  BP: 140/82  Pulse: 60  Temp: 98 F (36.7 C)  TempSrc: Oral  SpO2: 99%  Weight: 269 lb 8 oz (122.2 kg)  Height: 5\' 11"  (1.803 m)   99% on RA BMI Readings from Last 3 Encounters:  12/09/20 37.59 kg/m  11/13/20 32.24 kg/m   Wt Readings from Last 3 Encounters:  12/09/20 269 lb 8 oz (122.2 kg)   11/13/20 237 lb 11.2 oz (107.8 kg)     CBC    Component Value Date/Time   HGB 10.3 (L) 11/14/2020 0330   HCT 30.1 (L) 11/14/2020 0330     Chest Imaging: June 2022 CT chest: 2.3 x 1.6 spiculated left upper lobe nodule concerning for primary bronchogenic carcinoma. The patient's images have been independently reviewed by me.    Pulmonary Functions Testing Results: No flowsheet data found.  FeNO:   Pathology:   Echocardiogram:   Heart Catheterization:     Assessment &  Plan:     ICD-10-CM   1. Left upper lobe pulmonary nodule  R91.1 NM PET Image Initial (PI) Skull Base To Thigh    CT Super D Chest Wo Contrast    Ambulatory referral to Pulmonology    2. Malignant neoplasm of urinary bladder, unspecified site (HCC)  C67.9 NM PET Image Initial (PI) Skull Base To Thigh    CT Super D Chest Wo Contrast    Ambulatory referral to Pulmonology    3. Former smoker  [id number]       Discussion: This is a 70 year old gentleman, former smoker quit 1992.  Recent diagnosis of muscle invasive bladder cancer by urology.  He had small cell features and he has been referred to medical oncology.  He establishes care with Dr. Alen Blew this Friday.  We reviewed patient's CT imaging today with a left upper lobe pulmonary nodule with concerning features for potential underlying malignancy.  Based on location and features I suspect we are dealing with an additional primary lung cancer in conjunction with his bladder cancer however this cannot be excluded until we obtain biopsy.  Plan: Discussed risk benefits and alternatives of proceeding with navigational bronchoscopy, tissue sampling as well as possible fiducial placement. We discussed the risk of bleeding and pneumothorax. Patient is agreeable to proceed next available time slot. Patient has a nuclear medicine PET scan that has been ordered.  We need to work to get this scheduled. I would like to have a nuclear medicine pet image scheduled  before the bronchoscopy if possible.  However would not want to delay. Patient will need a noncontrasted CT scan of the chest completed for super D formatting.  We discussed all of this today in the office.  He has PFTs scheduled for this afternoon.     Current Outpatient Medications:    amLODipine (NORVASC) 5 MG tablet, Take 1 tablet by mouth every morning., Disp: , Rfl:    lisinopril (ZESTRIL) 40 MG tablet, Take 40 mg by mouth every morning., Disp: , Rfl:    metoprolol succinate (TOPROL-XL) 100 MG 24 hr tablet, Take 100 mg by mouth every morning., Disp: , Rfl:    pravastatin (PRAVACHOL) 80 MG tablet, Take 80 mg by mouth at bedtime., Disp: , Rfl:    tamsulosin (FLOMAX) 0.4 MG CAPS capsule, Take 0.4 mg by mouth daily., Disp: , Rfl:   I spent 62 minutes dedicated to the care of this patient on the date of this encounter to include pre-visit review of records, face-to-face time with the patient discussing conditions above, post visit ordering of testing, clinical documentation with the electronic health record, making appropriate referrals as documented, and communicating necessary findings to members of the patients care team.   Garner Nash, DO [facility] 12/09/2020 9:09 AM

## 2020-12-11 DIAGNOSIS — E039 Hypothyroidism, unspecified: Secondary | ICD-10-CM | POA: Diagnosis not present

## 2020-12-11 DIAGNOSIS — Z79899 Other long term (current) drug therapy: Secondary | ICD-10-CM | POA: Diagnosis not present

## 2020-12-11 DIAGNOSIS — E7800 Pure hypercholesterolemia, unspecified: Secondary | ICD-10-CM | POA: Diagnosis not present

## 2020-12-11 DIAGNOSIS — E538 Deficiency of other specified B group vitamins: Secondary | ICD-10-CM | POA: Diagnosis not present

## 2020-12-12 ENCOUNTER — Ambulatory Visit (HOSPITAL_COMMUNITY): Payer: Medicare Other

## 2020-12-12 DIAGNOSIS — D510 Vitamin B12 deficiency anemia due to intrinsic factor deficiency: Secondary | ICD-10-CM | POA: Diagnosis not present

## 2020-12-12 DIAGNOSIS — E7800 Pure hypercholesterolemia, unspecified: Secondary | ICD-10-CM | POA: Diagnosis not present

## 2020-12-12 DIAGNOSIS — I10 Essential (primary) hypertension: Secondary | ICD-10-CM | POA: Diagnosis not present

## 2020-12-13 ENCOUNTER — Inpatient Hospital Stay: Payer: Medicare Other | Attending: Oncology | Admitting: Oncology

## 2020-12-13 ENCOUNTER — Other Ambulatory Visit: Payer: Self-pay

## 2020-12-13 VITALS — BP 169/84 | HR 75 | Temp 98.3°F | Resp 18 | Ht 71.0 in | Wt 272.4 lb

## 2020-12-13 DIAGNOSIS — C787 Secondary malignant neoplasm of liver and intrahepatic bile duct: Secondary | ICD-10-CM | POA: Insufficient documentation

## 2020-12-13 DIAGNOSIS — C779 Secondary and unspecified malignant neoplasm of lymph node, unspecified: Secondary | ICD-10-CM | POA: Insufficient documentation

## 2020-12-13 DIAGNOSIS — Z5111 Encounter for antineoplastic chemotherapy: Secondary | ICD-10-CM | POA: Insufficient documentation

## 2020-12-13 DIAGNOSIS — C679 Malignant neoplasm of bladder, unspecified: Secondary | ICD-10-CM | POA: Insufficient documentation

## 2020-12-13 DIAGNOSIS — Z87891 Personal history of nicotine dependence: Secondary | ICD-10-CM | POA: Diagnosis not present

## 2020-12-13 DIAGNOSIS — R911 Solitary pulmonary nodule: Secondary | ICD-10-CM | POA: Diagnosis not present

## 2020-12-13 DIAGNOSIS — C7951 Secondary malignant neoplasm of bone: Secondary | ICD-10-CM | POA: Insufficient documentation

## 2020-12-13 MED ORDER — LIDOCAINE-PRILOCAINE 2.5-2.5 % EX CREA: 1.0000 "application " | TOPICAL_CREAM | CUTANEOUS | 0 refills | Status: DC | PRN

## 2020-12-13 MED ORDER — PROCHLORPERAZINE MALEATE 10 MG PO TABS: 10.0000 mg | ORAL_TABLET | Freq: Four times a day (QID) | ORAL | 0 refills | Status: AC | PRN

## 2020-12-13 NOTE — Progress Notes (Signed)
START ON PATHWAY REGIMEN - Bladder     A cycle is every 21 days:     Gemcitabine      Cisplatin   **Always confirm dose/schedule in your pharmacy ordering system**  Patient Characteristics: Pre-Cystectomy or Nonsurgical Candidate (Clinical Staging), cT2-4a, cN0-1, M0, Cystectomy Eligible, Cisplatin-Based Chemotherapy Indicated (CrCl ? 50 mL/min and Minimal or No Symptoms) Therapeutic Status: Pre-Cystectomy or Nonsurgical Candidate (Clinical Staging) AJCC M Category: cM0 AJCC 8 Stage Grouping: IIIA AJCC T Category: cT3 AJCC N Category: cN0 Intent of Therapy: Curative Intent, Discussed with Patient 

## 2020-12-13 NOTE — Progress Notes (Signed)
Reason for the request:    Bladder cancer  HPI: I was asked by Dr. Louis Meckel to evaluate Javier Sellers for the diagnosis of bladder cancer.  He is a 70 year old man with a history of hypertension and hyperlipidemia who presented with gross hematuria in May 2022.  He underwent an evaluation by Dr. Louis Meckel and a CT scan of the abdomen and pelvis obtained on Oct 28, 2020 showed a bladder wall thickening involving the right mid urinary bladder without any evidence of metastatic disease.  He underwent TURBT on November 13, 2020 and the final pathology revealed high-grade urothelial carcinoma with small cell component of 70%.  CT scan of the chest obtained on 11/26/2020 showed a spiculated nodule in the left apex of the lung measuring 2.3 x 1.6 cm concerning for malignancy.  He was evaluated by Dr. Valeta Harms and scheduled for a bronchoscopy and biopsy on December 20, 2020.  He is also scheduled for a PET scan in the following week.  Clinically, he reports no complaints at this time.  He denies any hematuria, dysuria or flank pain.  He denies any excessive fatigue, tiredness or weakness.  He remains active and continues to attempt activities of daily living.   He does not report any headaches, blurry vision, syncope or seizures. Does not report any fevers, chills or sweats.  Does not report any cough, wheezing or hemoptysis.  Does not report any chest pain, palpitation, orthopnea or leg edema.  Does not report any nausea, vomiting or abdominal pain.  Does not report any constipation or diarrhea.  Does not report any skeletal complaints.    Does not report frequency, urgency or hematuria.  Does not report any skin rashes or lesions. Does not report any heat or cold intolerance.  Does not report any lymphadenopathy or petechiae.  Does not report any anxiety or depression.  Remaining review of systems is negative.     Past Medical History:  Diagnosis Date   Complication of anesthesia    vomiting only with ether   High cholesterol     Hypertension   :   Past Surgical History:  Procedure Laterality Date   APPENDECTOMY     ROTATOR CUFF REPAIR Bilateral   :   Current Outpatient Medications:    amLODipine (NORVASC) 5 MG tablet, Take 5 mg by mouth every morning., Disp: , Rfl:    diclofenac Sodium (VOLTAREN) 1 % GEL, Apply 1 application topically daily as needed (knee pain)., Disp: , Rfl:    lisinopril (ZESTRIL) 40 MG tablet, Take 40 mg by mouth every morning., Disp: , Rfl:    Menthol-Camphor (ICY HOT ADVANCED PAIN RELIEF) 16-11 % CREA, Apply 1 application topically daily as needed (pain)., Disp: , Rfl:    metoprolol succinate (TOPROL-XL) 100 MG 24 hr tablet, Take 100 mg by mouth every morning., Disp: , Rfl:    pravastatin (PRAVACHOL) 80 MG tablet, Take 80 mg by mouth at bedtime., Disp: , Rfl:    tamsulosin (FLOMAX) 0.4 MG CAPS capsule, Take 0.4 mg by mouth daily., Disp: , Rfl: :  No Known Allergies:  No family history on file.:   Social History   Socioeconomic History   Marital status: Married    Spouse name: Not on file   Number of children: Not on file   Years of education: Not on file   Highest education level: Not on file  Occupational History   Not on file  Tobacco Use   Smoking status: Former    Packs/day: 3.50  Years: 20.00    Pack years: 70.00    Types: Cigarettes    Quit date: 11/14/1990    Years since quitting: 30.1   Smokeless tobacco: Never  Vaping Use   Vaping Use: Never used  Substance and Sexual Activity   Alcohol use: Yes    Comment: 3 cans beer daily   Drug use: Never   Sexual activity: Not on file  Other Topics Concern   Not on file  Social History Narrative   Not on file   Social Determinants of Health   Financial Resource Strain: Not on file  Food Insecurity: Not on file  Transportation Needs: Not on file  Physical Activity: Not on file  Stress: Not on file  Social Connections: Not on file  Intimate Partner Violence: Not on file  :  Pertinent items are noted in  HPI.  Exam: Blood pressure (!) 169/84, pulse 75, temperature 98.3 F (36.8 C), temperature source Oral, resp. rate 18, height 5\' 11"  (1.803 m), weight 272 lb 6.4 oz (123.6 kg), SpO2 99 %. ECOG 0 General appearance: alert and cooperative appeared without distress. Head: atraumatic without any abnormalities. Eyes: conjunctivae/corneas clear. PERRL.  Sclera anicteric. Throat: lips, mucosa, and tongue normal; without oral thrush or ulcers. Resp: clear to auscultation bilaterally without rhonchi, wheezes or dullness to percussion. Cardio: regular rate and rhythm, S1, S2 normal, no murmur, click, rub or gallop GI: soft, non-tender; bowel sounds normal; no masses,  no organomegaly Skin: Skin color, texture, turgor normal. No rashes or lesions Lymph nodes: Cervical, supraclavicular, and axillary nodes normal. Neurologic: Grossly normal without any motor, sensory or deep tendon reflexes. Musculoskeletal: No joint deformity or effusion.    Assessment and Plan:    70 year old man with:  1.  Bladder cancer presented with hematuria and found to have high-grade urothelial carcinoma with muscle invasion and small cell component on a TURBT on November 13, 2020.  Imaging studies did not show evidence of metastatic disease in the abdomen and pelvis but does have a lung mass currently under evaluation.  Given the histology of high-grade urothelial carcinoma with a large small cell component, upfront chemotherapy is highly recommended at this time with cisplatin based regimen.  If his PET scan did not show any evidence of metastatic disease beyond the lung nodule then radical cystectomy remains his best curative option.  Radiation therapy could also be a alternative at that point.   The logistics and rationale for using chemotherapy was reviewed today in detail.  Complication associated with cisplatin and gemcitabine chemotherapy was discussed.  These complications include nausea, vomiting, myelosuppression,  fatigue, infusion related complications, renal insufficiency, neutropenia, neutropenic sepsis and rarely serious thrombosis, hospitalization and death.  The benefit would also if he has an excellent response to chemotherapy, curative surgical resection may be attempted.  The plan is to treat with gemcitabine and cisplatin on day 1, gemcitabine day 8 out of a 21-day cycle.  Anticipate needing 4 cycles of therapy unless he has metastatic disease.   After discussion today, he is agreeable to proceed after chemo education class.     2.  IV access: Risks and benefits of using Port-A-Cath versus peripheral veins was discussed today.  Complication associated with Port-A-Cath insertion include bleeding, infection and thrombosis.  After discussing the risks and benefits, he is agreeable to proceed.   3.  Antiemetics: Prescription for Compazine was made available to him.   4.  Renal function surveillance: will continue to monitor on cisplatin therapy.  Baseline  kidney function is normal.   5.  Goals of care: Therapy remains curative at this time and aggressive measures are warranted.  6.  Lung nodule: He has left upper lobe mass that currently under evaluation.  The differential diagnosis including primary lung neoplasm which is probable versus metastatic disease which is less likely.  Management of his lung nodule will be determined pending the pathology.  If this is a primary lung neoplasm I would be in favor of addressing his bladder cancer first and potentially reimage after neoadjuvant chemotherapy.  If residual cancer left at that time surgical resection versus radiation based therapy could be considered at that time.   7.  Follow-up: We will be in the immediate future to start chemotherapy.  80  minutes were dedicated to this visit. The time was spent on reviewing laboratory data, imaging studies, discussing treatment options, discussing differential diagnosis, addressing complications of therapy for  both malignancies and answering questions regarding future plan.     A copy of this consult has been forwarded to the requesting physician.

## 2020-12-18 ENCOUNTER — Other Ambulatory Visit (HOSPITAL_COMMUNITY): Payer: Medicare Other

## 2020-12-18 ENCOUNTER — Ambulatory Visit (HOSPITAL_COMMUNITY)
Admission: RE | Admit: 2020-12-18 | Discharge: 2020-12-18 | Disposition: A | Discharge status: Home / Self Care | Payer: Medicare Other | Source: Ambulatory Visit | Attending: Pulmonary Disease | Admitting: Pulmonary Disease

## 2020-12-18 ENCOUNTER — Other Ambulatory Visit (HOSPITAL_COMMUNITY)
Admission: RE | Admit: 2020-12-18 | Discharge: 2020-12-18 | Disposition: A | Discharge status: Home / Self Care | Payer: Medicare Other | Source: Ambulatory Visit | Attending: Pulmonary Disease | Admitting: Pulmonary Disease

## 2020-12-18 ENCOUNTER — Other Ambulatory Visit: Payer: Self-pay

## 2020-12-18 DIAGNOSIS — R911 Solitary pulmonary nodule: Secondary | ICD-10-CM | POA: Insufficient documentation

## 2020-12-18 DIAGNOSIS — Z01812 Encounter for preprocedural laboratory examination: Secondary | ICD-10-CM | POA: Insufficient documentation

## 2020-12-18 DIAGNOSIS — C679 Malignant neoplasm of bladder, unspecified: Secondary | ICD-10-CM | POA: Insufficient documentation

## 2020-12-18 DIAGNOSIS — I700 Atherosclerosis of aorta: Secondary | ICD-10-CM | POA: Diagnosis not present

## 2020-12-18 DIAGNOSIS — Z20822 Contact with and (suspected) exposure to covid-19: Secondary | ICD-10-CM | POA: Insufficient documentation

## 2020-12-18 LAB — SARS CORONAVIRUS 2 (TAT 6-24 HRS): SARS Coronavirus 2: NEGATIVE

## 2020-12-19 ENCOUNTER — Other Ambulatory Visit: Payer: Self-pay

## 2020-12-19 ENCOUNTER — Encounter (HOSPITAL_COMMUNITY): Payer: Self-pay | Admitting: Pulmonary Disease

## 2020-12-19 NOTE — Anesthesia Preprocedure Evaluation (Addendum)
Anesthesia Evaluation  Patient identified by MRN, date of birth, ID band Patient awake    Reviewed: Allergy & Precautions, NPO status , Patient's Chart, lab work & pertinent test results  Airway Mallampati: III  TM Distance: >3 FB Neck ROM: Full    Dental  (+) Dental Advisory Given   Pulmonary neg pulmonary ROS, former smoker,    Pulmonary exam normal breath sounds clear to auscultation       Cardiovascular hypertension, Pt. on medications and Pt. on home beta blockers Normal cardiovascular exam Rhythm:Regular Rate:Normal     Neuro/Psych negative neurological ROS     GI/Hepatic negative GI ROS, Neg liver ROS,   Endo/Other  negative endocrine ROS  Renal/GU negative Renal ROS     Musculoskeletal negative musculoskeletal ROS (+)   Abdominal   Peds  Hematology negative hematology ROS (+)   Anesthesia Other Findings   Reproductive/Obstetrics                            Anesthesia Physical Anesthesia Plan  ASA: 3  Anesthesia Plan: General   Post-op Pain Management:    Induction: Intravenous  PONV Risk Score and Plan: 2 and Ondansetron, Dexamethasone, Treatment may vary due to age or medical condition and Midazolam  Airway Management Planned: Oral ETT  Additional Equipment:   Intra-op Plan:   Post-operative Plan: Extubation in OR  Informed Consent: I have reviewed the patients History and Physical, chart, labs and discussed the procedure including the risks, benefits and alternatives for the proposed anesthesia with the patient or authorized representative who has indicated his/her understanding and acceptance.     Dental advisory given  Plan Discussed with: CRNA  Anesthesia Plan Comments:        Anesthesia Quick Evaluation

## 2020-12-19 NOTE — Progress Notes (Signed)
PCP - Dr. Lin Landsman Cardiologist - denies EKG - DOS Chest x-ray -  ECHO -  Cardiac Cath -  CPAP -    COVID TEST- yes negative  Anesthesia review: n/a  -------------  SDW INSTRUCTIONS:  Your procedure is scheduled on 7/8 Friday. Please report to [facility] Main Entrance "A" at 05:30 A.M., and check in at the Admitting office. Call this number if you have problems the morning of surgery: [phone]   Remember: Do not eat or drink after midnight the night before your surgery   Medications to take morning of surgery with a sip of water include: amLODipine (NORVASC)  metoprolol succinate (TOPROL-XL)   As of today, STOP taking any Aspirin (unless otherwise instructed by your surgeon), Aleve, Naproxen, Ibuprofen, Motrin, Advil, Goody's, BC's, all herbal medications, fish oil, and all vitamins.    The Morning of Surgery Do not wear jewelry Do not wear lotions, powders, colognes, or deodorant Men may shave face and neck. Do not bring valuables to the hospital. [facility] is not responsible for any belongings or valuables.  If you are a smoker, DO NOT Smoke 24 hours prior to surgery If you wear a CPAP at night please bring your mask the morning of surgery  Remember that you must have someone to transport you home after your surgery, and remain with you for 24 hours if you are discharged the same day.  Please bring cases for contacts, glasses, hearing aids, dentures or bridgework because it cannot be worn into surgery.   Patients discharged the day of surgery will not be allowed to drive home.   Please shower the NIGHT BEFORE/MORNING OF SURGERY (use antibacterial soap like DIAL soap if possible). Wear comfortable clothes the morning of surgery. Oral Hygiene is also important to reduce your risk of infection.  Remember - BRUSH YOUR TEETH THE MORNING OF SURGERY WITH YOUR REGULAR TOOTHPASTE  Patient denies shortness of breath, fever, cough and chest pain.

## 2020-12-20 ENCOUNTER — Inpatient Hospital Stay: Payer: Medicare Other

## 2020-12-20 ENCOUNTER — Ambulatory Visit (HOSPITAL_COMMUNITY): Payer: Medicare Other | Admitting: Anesthesiology

## 2020-12-20 ENCOUNTER — Encounter (HOSPITAL_COMMUNITY): Payer: Self-pay | Admitting: Pulmonary Disease

## 2020-12-20 ENCOUNTER — Other Ambulatory Visit: Payer: Self-pay

## 2020-12-20 ENCOUNTER — Ambulatory Visit (HOSPITAL_COMMUNITY)
Admission: RE | Admit: 2020-12-20 | Discharge: 2020-12-20 | Disposition: A | Discharge status: Home / Self Care | Payer: Medicare Other | Attending: Pulmonary Disease | Admitting: Pulmonary Disease

## 2020-12-20 ENCOUNTER — Ambulatory Visit (HOSPITAL_COMMUNITY): Payer: Medicare Other

## 2020-12-20 ENCOUNTER — Encounter (HOSPITAL_COMMUNITY)
Admission: RE | Disposition: A | Discharge status: Home / Self Care | Payer: Self-pay | Source: Home / Self Care | Attending: Pulmonary Disease

## 2020-12-20 DIAGNOSIS — Z419 Encounter for procedure for purposes other than remedying health state, unspecified: Secondary | ICD-10-CM

## 2020-12-20 DIAGNOSIS — R918 Other nonspecific abnormal finding of lung field: Secondary | ICD-10-CM | POA: Diagnosis present

## 2020-12-20 DIAGNOSIS — Z79899 Other long term (current) drug therapy: Secondary | ICD-10-CM | POA: Insufficient documentation

## 2020-12-20 DIAGNOSIS — C679 Malignant neoplasm of bladder, unspecified: Secondary | ICD-10-CM | POA: Diagnosis not present

## 2020-12-20 DIAGNOSIS — Z87891 Personal history of nicotine dependence: Secondary | ICD-10-CM | POA: Insufficient documentation

## 2020-12-20 DIAGNOSIS — R911 Solitary pulmonary nodule: Secondary | ICD-10-CM | POA: Diagnosis not present

## 2020-12-20 DIAGNOSIS — I10 Essential (primary) hypertension: Secondary | ICD-10-CM | POA: Diagnosis not present

## 2020-12-20 DIAGNOSIS — Z9889 Other specified postprocedural states: Secondary | ICD-10-CM

## 2020-12-20 DIAGNOSIS — C3412 Malignant neoplasm of upper lobe, left bronchus or lung: Secondary | ICD-10-CM | POA: Insufficient documentation

## 2020-12-20 DIAGNOSIS — E7800 Pure hypercholesterolemia, unspecified: Secondary | ICD-10-CM | POA: Diagnosis not present

## 2020-12-20 HISTORY — PX: BRONCHIAL BRUSHINGS: SHX5108

## 2020-12-20 HISTORY — PX: BRONCHIAL WASHINGS: SHX5105

## 2020-12-20 HISTORY — PX: BRONCHIAL BIOPSY: SHX5109

## 2020-12-20 HISTORY — PX: BRONCHIAL NEEDLE ASPIRATION BIOPSY: SHX5106

## 2020-12-20 HISTORY — PX: VIDEO BRONCHOSCOPY WITH ENDOBRONCHIAL NAVIGATION: SHX6175

## 2020-12-20 HISTORY — PX: FIDUCIAL MARKER PLACEMENT: SHX6858

## 2020-12-20 LAB — CBC
HCT: 39.4 % (ref 39.0–52.0)
Hemoglobin: 12.9 g/dL — ABNORMAL LOW (ref 13.0–17.0)
MCH: 30.4 pg (ref 26.0–34.0)
MCHC: 32.7 g/dL (ref 30.0–36.0)
MCV: 92.7 fL (ref 80.0–100.0)
Platelets: 159 10*3/uL (ref 150–400)
RBC: 4.25 MIL/uL (ref 4.22–5.81)
RDW: 13.2 % (ref 11.5–15.5)
WBC: 5.1 10*3/uL (ref 4.0–10.5)
nRBC: 0 % (ref 0.0–0.2)

## 2020-12-20 LAB — BASIC METABOLIC PANEL
Anion gap: 9 (ref 5–15)
BUN: 10 mg/dL (ref 8–23)
CO2: 26 mmol/L (ref 22–32)
Calcium: 9.2 mg/dL (ref 8.9–10.3)
Chloride: 103 mmol/L (ref 98–111)
Creatinine, Ser: 1.07 mg/dL (ref 0.61–1.24)
GFR, Estimated: >60 mL/min (ref >60)
Glucose, Bld: 131 mg/dL — ABNORMAL HIGH (ref 70–99)
Potassium: 4.3 mmol/L (ref 3.5–5.1)
Sodium: 138 mmol/L (ref 135–145)

## 2020-12-20 SURGERY — VIDEO BRONCHOSCOPY WITH ENDOBRONCHIAL NAVIGATION
Anesthesia: General | Laterality: Left

## 2020-12-20 MED ORDER — ONDANSETRON HCL 4 MG/2ML IJ SOLN
INTRAMUSCULAR | Status: DC | PRN
  Administered 2020-12-20: 4 mg via INTRAVENOUS

## 2020-12-20 MED ORDER — SUGAMMADEX SODIUM 200 MG/2ML IV SOLN
INTRAVENOUS | Status: DC | PRN
  Administered 2020-12-20: 250 mg via INTRAVENOUS

## 2020-12-20 MED ORDER — LACTATED RINGERS IV SOLN: INTRAVENOUS | Status: DC

## 2020-12-20 MED ORDER — CHLORHEXIDINE GLUCONATE 0.12 % MT SOLN
OROMUCOSAL | Status: AC
  Administered 2020-12-20: 15 mL via OROMUCOSAL
  Filled 2020-12-20: qty 15

## 2020-12-20 MED ORDER — FENTANYL CITRATE (PF) 100 MCG/2ML IJ SOLN
INTRAMUSCULAR | Status: DC | PRN
  Administered 2020-12-20: 100 ug via INTRAVENOUS

## 2020-12-20 MED ORDER — DEXAMETHASONE SODIUM PHOSPHATE 10 MG/ML IJ SOLN
INTRAMUSCULAR | Status: DC | PRN
  Administered 2020-12-20: 5 mg via INTRAVENOUS

## 2020-12-20 MED ORDER — MIDAZOLAM HCL 2 MG/2ML IJ SOLN
INTRAMUSCULAR | Status: DC | PRN
  Administered 2020-12-20: 2 mg via INTRAVENOUS

## 2020-12-20 MED ORDER — PHENYLEPHRINE HCL-NACL 10-0.9 MG/250ML-% IV SOLN
INTRAVENOUS | Status: DC | PRN
  Administered 2020-12-20: 20 ug/min via INTRAVENOUS

## 2020-12-20 MED ORDER — ROCURONIUM BROMIDE 100 MG/10ML IV SOLN
INTRAVENOUS | Status: DC | PRN
  Administered 2020-12-20: 60 mg via INTRAVENOUS

## 2020-12-20 MED ORDER — PROPOFOL 10 MG/ML IV BOLUS
INTRAVENOUS | Status: DC | PRN
  Administered 2020-12-20: 200 mg via INTRAVENOUS

## 2020-12-20 MED ORDER — LIDOCAINE HCL (CARDIAC) PF 100 MG/5ML IV SOSY
PREFILLED_SYRINGE | INTRAVENOUS | Status: DC | PRN
  Administered 2020-12-20: 100 mg via INTRATRACHEAL

## 2020-12-20 MED ORDER — PHENYLEPHRINE 40 MCG/ML (10ML) SYRINGE FOR IV PUSH (FOR BLOOD PRESSURE SUPPORT)
PREFILLED_SYRINGE | INTRAVENOUS | Status: DC | PRN
  Administered 2020-12-20: 160 ug via INTRAVENOUS
  Administered 2020-12-20 (×3): 80 ug via INTRAVENOUS

## 2020-12-20 MED ORDER — CHLORHEXIDINE GLUCONATE 0.12 % MT SOLN
15.0000 mL | Freq: Once | OROMUCOSAL | Status: AC
  Filled 2020-12-20: qty 15

## 2020-12-20 SURGICAL SUPPLY — 45 items
ADAPTER BRONCH F/PENTAX (ADAPTER) ×3 IMPLANT
ADAPTER VALVE BIOPSY EBUS (MISCELLANEOUS) IMPLANT
ADPTR VALVE BIOPSY EBUS (MISCELLANEOUS)
BRUSH CYTOL CELLEBRITY 1.5X140 (MISCELLANEOUS) ×3 IMPLANT
BRUSH SUPERTRAX BIOPSY (INSTRUMENTS) IMPLANT
BRUSH SUPERTRAX NDL-TIP CYTO (INSTRUMENTS) ×3 IMPLANT
CANISTER SUCT 3000ML PPV (MISCELLANEOUS) ×3 IMPLANT
CHANNEL WORK EXTEND EDGE 180 (KITS) IMPLANT
CHANNEL WORK EXTEND EDGE 45 (KITS) IMPLANT
CHANNEL WORK EXTEND EDGE 90 (KITS) IMPLANT
CONT SPEC 4OZ CLIKSEAL STRL BL (MISCELLANEOUS) ×3 IMPLANT
COVER BACK TABLE 60X90IN (DRAPES) ×3 IMPLANT
FILTER STRAW FLUID ASPIR (MISCELLANEOUS) IMPLANT
FORCEPS BIOP SUPERTRX PREMAR (INSTRUMENTS) ×3 IMPLANT
GAUZE SPONGE 4X4 12PLY STRL (GAUZE/BANDAGES/DRESSINGS) ×3 IMPLANT
GLOVE SURG SS PI 7.5 STRL IVOR (GLOVE) ×6 IMPLANT
GOWN STRL REUS W/ TWL LRG LVL3 (GOWN DISPOSABLE) ×4 IMPLANT
GOWN STRL REUS W/TWL LRG LVL3 (GOWN DISPOSABLE) ×2
KIT CLEAN ENDO COMPLIANCE (KITS) ×3 IMPLANT
KIT LOCATABLE GUIDE (CANNULA) IMPLANT
KIT MARKER FIDUCIAL DELIVERY (KITS) IMPLANT
KIT PROCEDURE EDGE 180 (KITS) IMPLANT
KIT PROCEDURE EDGE 45 (KITS) IMPLANT
KIT PROCEDURE EDGE 90 (KITS) IMPLANT
KIT TURNOVER KIT B (KITS) ×3 IMPLANT
MARKER SKIN DUAL TIP RULER LAB (MISCELLANEOUS) ×3 IMPLANT
NEEDLE SUPERTRX PREMARK BIOPSY (NEEDLE) ×3 IMPLANT
NS IRRIG 1000ML POUR BTL (IV SOLUTION) ×3 IMPLANT
OIL SILICONE PENTAX (PARTS (SERVICE/REPAIRS)) ×3 IMPLANT
PAD ARMBOARD 7.5X6 YLW CONV (MISCELLANEOUS) ×6 IMPLANT
PATCHES PATIENT (LABEL) ×9 IMPLANT
SOL ANTI FOG 6CC (MISCELLANEOUS) ×2 IMPLANT
SOLUTION ANTI FOG 6CC (MISCELLANEOUS) ×1
SYR 20CC LL (SYRINGE) ×3 IMPLANT
SYR 20ML ECCENTRIC (SYRINGE) ×3 IMPLANT
SYR 50ML SLIP (SYRINGE) ×3 IMPLANT
TOWEL OR 17X24 6PK STRL BLUE (TOWEL DISPOSABLE) ×3 IMPLANT
TRAP SPECIMEN MUCOUS 40CC (MISCELLANEOUS) IMPLANT
TUBE CONNECTING 20X1/4 (TUBING) ×3 IMPLANT
UNDERPAD 30X30 (UNDERPADS AND DIAPERS) ×3 IMPLANT
VALVE BIOPSY  SINGLE USE (MISCELLANEOUS) ×1
VALVE BIOPSY SINGLE USE (MISCELLANEOUS) ×2 IMPLANT
VALVE SUCTION BRONCHIO DISP (MISCELLANEOUS) ×3 IMPLANT
WATER STERILE IRR 1000ML POUR (IV SOLUTION) ×3 IMPLANT
fiducial marker ×6 IMPLANT

## 2020-12-20 NOTE — Interval H&P Note (Signed)
History and Physical Interval Note:  12/20/2020 7:15 AM  Javier Sellers  has presented today for surgery, with the diagnosis of LEFT LUNG NODULE.  The various methods of treatment have been discussed with the patient and family. After consideration of risks, benefits and other options for treatment, the patient has consented to  Procedure(s): [place] (Left) as a surgical intervention.  The patient's history has been reviewed, patient examined, no change in status, stable for surgery.  I have reviewed the patient's chart and labs.  Questions were answered to the patient's satisfaction.     Heard

## 2020-12-20 NOTE — Transfer of Care (Signed)
Immediate Anesthesia Transfer of Care Note  Patient: Javier Sellers  Procedure(s) Performed: VIDEO BRONCHOSCOPY WITH ENDOBRONCHIAL NAVIGATION (Left) BRONCHIAL BRUSHINGS BRONCHIAL NEEDLE ASPIRATION BIOPSIES BRONCHIAL BIOPSIES FIDUCIAL MARKER PLACEMENT BRONCHIAL WASHINGS  Patient Location: Endoscopy Unit  Anesthesia Type:General  Level of Consciousness: awake, alert  and oriented  Airway & Oxygen Therapy: Patient Spontanous Breathing and Patient connected to face mask oxygen  Post-op Assessment: Report given to RN and Post -op Vital signs reviewed and stable  Post vital signs: Reviewed and stable  Last Vitals:  Vitals Value Taken Time  BP 180/79 12/20/20 0837  Temp    Pulse 68 12/20/20 0838  Resp 14 12/20/20 0838  SpO2 96 % 12/20/20 0838  Vitals shown include unvalidated device data.  Last Pain:  Vitals:   12/20/20 0629  TempSrc:   PainSc: 0-No pain      Patients Stated Pain Goal: 5 (82/42/35 [id number])  Complications: No notable events documented.

## 2020-12-20 NOTE — Anesthesia Postprocedure Evaluation (Signed)
Anesthesia Post Note  Patient: Javier Sellers  Procedure(s) Performed: VIDEO BRONCHOSCOPY WITH ENDOBRONCHIAL NAVIGATION (Left) BRONCHIAL BRUSHINGS BRONCHIAL NEEDLE ASPIRATION BIOPSIES BRONCHIAL BIOPSIES FIDUCIAL MARKER PLACEMENT BRONCHIAL WASHINGS     Patient location during evaluation: PACU Anesthesia Type: General Level of consciousness: sedated and patient cooperative Pain management: pain level controlled Vital Signs Assessment: post-procedure vital signs reviewed and stable Respiratory status: spontaneous breathing Cardiovascular status: stable Anesthetic complications: no   No notable events documented.  Last Vitals:  Vitals:   12/20/20 0900 12/20/20 0910  BP: 138/71 (!) 160/68  Pulse: 66 66  Resp: 18 16  Temp:    SpO2: 97% 98%    Last Pain:  Vitals:   12/20/20 0910  TempSrc:   PainSc: 0-No pain                 Nolon Nations

## 2020-12-20 NOTE — Discharge Instructions (Signed)
Flexible Bronchoscopy, Care After This sheet gives you information about how to care for yourself after your test. Your doctor may also give you more specific instructions. If you have problems or questions, contact your doctor. Follow these instructions at home: Eating and drinking Do not eat or drink anything (not even water) for 2 hours after your test, or until your numbing medicine (local anesthetic) wears off. When your numbness is gone and your cough and gag reflexes have come back, you may: Eat only soft foods. Slowly drink liquids. The day after the test, go back to your normal diet. Driving Do not drive for 24 hours if you were given a medicine to help you relax (sedative). Do not drive or use heavy machinery while taking prescription pain medicine. General instructions  Take over-the-counter and prescription medicines only as told by your doctor. Return to your normal activities as told. Ask what activities are safe for you. Do not use any products that have nicotine or tobacco in them. This includes cigarettes and e-cigarettes. If you need help quitting, ask your doctor. Keep all follow-up visits as told by your doctor. This is important. It is very important if you had a tissue sample (biopsy) taken. Get help right away if: You have shortness of breath that gets worse. You get light-headed. You feel like you are going to pass out (faint). You have chest pain. You cough up: More than a little blood. More blood than before. Summary Do not eat or drink anything (not even water) for 2 hours after your test, or until your numbing medicine wears off. Do not use cigarettes. Do not use e-cigarettes. Get help right away if you have chest pain.  This information is not intended to replace advice given to you by your health care provider. Make sure you discuss any questions you have with your health care provider. Document Released: 03/29/2009 Document Revised: 05/14/2017 Document  Reviewed: 06/19/2016 Elsevier Patient Education  2020 [employer].

## 2020-12-20 NOTE — Anesthesia Procedure Notes (Signed)
Procedure Name: Intubation Date/Time: 12/20/2020 7:36 AM Performed by: Leonor Liv, CRNA Pre-anesthesia Checklist: Patient identified, Emergency Drugs available, Suction available and Patient being monitored Patient Re-evaluated:Patient Re-evaluated prior to induction Oxygen Delivery Method: Circle System Utilized Preoxygenation: Pre-oxygenation with 100% oxygen Induction Type: IV induction Ventilation: Mask ventilation without difficulty Laryngoscope Size: Mac and 4 Grade View: Grade II Tube type: Oral Tube size: 8.5 mm Number of attempts: 1 Airway Equipment and Method: Stylet and Oral airway Placement Confirmation: ETT inserted through vocal cords under direct vision, positive ETCO2 and breath sounds checked- equal and bilateral Secured at: 23 cm Tube secured with: Tape Dental Injury: Teeth and Oropharynx as per pre-operative assessment

## 2020-12-20 NOTE — Progress Notes (Signed)
Pharmacist Chemotherapy Monitoring - Initial Assessment    Anticipated start date: 12/27/20   The following has been reviewed per standard work regarding the patient's treatment regimen: The patient's diagnosis, treatment plan and drug doses, and organ/hematologic function Lab orders and baseline tests specific to treatment regimen  The treatment plan start date, drug sequencing, and pre-medications Prior authorization status  Patient's documented medication list, including drug-drug interaction screen and prescriptions for anti-emetics and supportive care specific to the treatment regimen The drug concentrations, fluid compatibility, administration routes, and timing of the medications to be used The patient's access for treatment and lifetime cumulative dose history, if applicable  The patient's medication allergies and previous infusion related reactions, if applicable   Changes made to treatment plan:  mg lab ordered to monitor mg with cisplatin  Follow up needed:  Pending authorization for treatment    Philomena Course, [place], 12/20/2020  10:39 AM

## 2020-12-20 NOTE — Op Note (Signed)
Video Bronchoscopy with Electromagnetic Navigation Procedure Note  Date of Operation: 12/20/2020  Pre-op Diagnosis: Left upper lobe lung mass  Post-op Diagnosis: Left upper lobe lung mass  Surgeon: Garner Nash, DO  Assistants: None   Anesthesia: General endotracheal anesthesia  Operation: Flexible video fiberoptic bronchoscopy with electromagnetic navigation and biopsies.  Estimated Blood Loss: Minimal  Complications: None   Indications and History: Javier Sellers is a 70 y.o. male with left upper lobe lung mass.  The risks, benefits, complications, treatment options and expected outcomes were discussed with the patient.  The possibilities of pneumothorax, pneumonia, reaction to medication, pulmonary aspiration, perforation of a viscus, bleeding, failure to diagnose a condition and creating a complication requiring transfusion or operation were discussed with the patient who freely signed the consent.    Description of Procedure: The patient was seen in the Preoperative Area, was examined and was deemed appropriate to proceed.  The patient was taken to [facility] endoscopy room 2, identified as Javier Sellers and the procedure verified as Flexible Video Fiberoptic Bronchoscopy.  A Time Out was held and the above information confirmed.   Prior to the date of the procedure a high-resolution CT scan of the chest was performed. Utilizing Thomas a virtual tracheobronchial tree was generated to allow the creation of distinct navigation pathways to the patient's parenchymal abnormalities. After being taken to the operating room general anesthesia was initiated and the patient  was orally intubated. The video fiberoptic bronchoscope was introduced via the endotracheal tube and a general inspection was performed which showed normal right and left lung anatomy no evidence of endobronchial lesion. The extendable working channel and locator guide were introduced into the bronchoscope. The  distinct navigation pathways prepared prior to this procedure were then utilized to navigate to within 0.6 cm of patient's lesion(s) identified on CT scan.  Full fluoroscopic sleep was obtained from RAO 25 degrees to LAO 25 degrees with inspiratory breath-hold APL at 20 cm water.  The extendable working channel was secured into place and the locator guide was withdrawn. Under fluoroscopic guidance transbronchial needle brushings, transbronchial Wang needle biopsies, and transbronchial forceps biopsies were performed to be sent for cytology and pathology.  Following tissue sampling 2 fiducials were placed in separate axial planes under fluoroscopic guidance using the fiducial catheter and delivery wire kit.  A bronchioalveolar lavage was performed in the left upper lobe and sent for cytology.  At the termination of the procedure standard therapy bronchoscope was inserted to the patient's airway both airways were examined bilaterally, aspiration of the bilateral mainstem's was necessary for removal of all remaining blood clots and debris.  All distal subsegments were patent at the termination of procedure.  At the end of the procedure a general airway inspection was performed and there was no evidence of active bleeding. The bronchoscope was removed.  The patient tolerated the procedure well. There was no significant blood loss and there were no obvious complications. A post-procedural chest x-ray is pending.  Samples: 1. Transbronchial needle brushings from left upper lobe 2. Transbronchial Wang needle biopsies from left upper lobe 3. Transbronchial forceps biopsies from left upper lobe 4. Bronchoalveolar lavage from left upper lobe  Plans:  The patient will be discharged from the PACU to home when recovered from anesthesia and after chest x-ray is reviewed. We will review the cytology, pathology results with the patient when they become available. Outpatient followup will be with Garner Nash, DO.    Garner Nash, DO  [facility] 12/20/2020 8:45 AM

## 2020-12-23 LAB — CYTOLOGY - NON PAP

## 2020-12-24 LAB — CYTOLOGY - NON PAP

## 2020-12-24 NOTE — Telephone Encounter (Signed)
Bronch performed on 12/20/20. Will close encounter.

## 2020-12-25 ENCOUNTER — Ambulatory Visit (HOSPITAL_COMMUNITY)
Admission: RE | Admit: 2020-12-25 | Discharge: 2020-12-25 | Disposition: A | Discharge status: Home / Self Care | Payer: Medicare Other | Source: Ambulatory Visit | Attending: Pulmonary Disease | Admitting: Pulmonary Disease

## 2020-12-25 ENCOUNTER — Other Ambulatory Visit: Payer: Self-pay | Admitting: Radiology

## 2020-12-25 ENCOUNTER — Other Ambulatory Visit: Payer: Self-pay

## 2020-12-25 DIAGNOSIS — C7951 Secondary malignant neoplasm of bone: Secondary | ICD-10-CM | POA: Diagnosis not present

## 2020-12-25 DIAGNOSIS — C679 Malignant neoplasm of bladder, unspecified: Secondary | ICD-10-CM | POA: Diagnosis not present

## 2020-12-25 DIAGNOSIS — R911 Solitary pulmonary nodule: Secondary | ICD-10-CM

## 2020-12-25 DIAGNOSIS — C787 Secondary malignant neoplasm of liver and intrahepatic bile duct: Secondary | ICD-10-CM | POA: Diagnosis not present

## 2020-12-25 LAB — GLUCOSE, CAPILLARY: Glucose-Capillary: 149 mg/dL — ABNORMAL HIGH (ref 70–99)

## 2020-12-25 MED ORDER — FLUDEOXYGLUCOSE F - 18 (FDG) INJECTION
13.7500 | Freq: Once | INTRAVENOUS | Status: AC
  Administered 2020-12-25: 13.74 via INTRAVENOUS

## 2020-12-26 ENCOUNTER — Ambulatory Visit (HOSPITAL_COMMUNITY)
Admission: RE | Admit: 2020-12-26 | Discharge: 2020-12-26 | Disposition: A | Discharge status: Home / Self Care | Payer: Medicare Other | Source: Ambulatory Visit | Attending: Oncology | Admitting: Oncology

## 2020-12-26 ENCOUNTER — Other Ambulatory Visit: Payer: Self-pay | Admitting: Oncology

## 2020-12-26 ENCOUNTER — Encounter (HOSPITAL_COMMUNITY): Payer: Self-pay

## 2020-12-26 DIAGNOSIS — Z87891 Personal history of nicotine dependence: Secondary | ICD-10-CM | POA: Diagnosis not present

## 2020-12-26 DIAGNOSIS — Z79899 Other long term (current) drug therapy: Secondary | ICD-10-CM | POA: Insufficient documentation

## 2020-12-26 DIAGNOSIS — C679 Malignant neoplasm of bladder, unspecified: Secondary | ICD-10-CM

## 2020-12-26 DIAGNOSIS — C688 Malignant neoplasm of overlapping sites of urinary organs: Secondary | ICD-10-CM | POA: Diagnosis not present

## 2020-12-26 DIAGNOSIS — E785 Hyperlipidemia, unspecified: Secondary | ICD-10-CM | POA: Insufficient documentation

## 2020-12-26 DIAGNOSIS — I10 Essential (primary) hypertension: Secondary | ICD-10-CM | POA: Diagnosis not present

## 2020-12-26 DIAGNOSIS — Z452 Encounter for adjustment and management of vascular access device: Secondary | ICD-10-CM | POA: Diagnosis not present

## 2020-12-26 HISTORY — PX: IR IMAGING GUIDED PORT INSERTION: IMG5740

## 2020-12-26 MED ORDER — MIDAZOLAM HCL 2 MG/2ML IJ SOLN
INTRAMUSCULAR | Status: AC | PRN
  Administered 2020-12-26 (×2): 1 mg via INTRAVENOUS

## 2020-12-26 MED ORDER — FENTANYL CITRATE (PF) 100 MCG/2ML IJ SOLN
INTRAMUSCULAR | Status: AC
  Filled 2020-12-26: qty 2

## 2020-12-26 MED ORDER — MIDAZOLAM HCL 2 MG/2ML IJ SOLN
INTRAMUSCULAR | Status: AC
  Filled 2020-12-26: qty 2

## 2020-12-26 MED ORDER — FENTANYL CITRATE (PF) 100 MCG/2ML IJ SOLN
INTRAMUSCULAR | Status: AC | PRN
  Administered 2020-12-26 (×2): 50 ug via INTRAVENOUS

## 2020-12-26 MED ORDER — SODIUM CHLORIDE 0.9 % IV SOLN: INTRAVENOUS | Status: DC

## 2020-12-26 MED ORDER — HEPARIN SOD (PORK) LOCK FLUSH 100 UNIT/ML IV SOLN
INTRAVENOUS | Status: AC
  Administered 2020-12-26: 5 [IU]
  Filled 2020-12-26: qty 5

## 2020-12-26 MED ORDER — LIDOCAINE-EPINEPHRINE 1 %-1:100000 IJ SOLN
INTRAMUSCULAR | Status: AC
  Administered 2020-12-26: 10 mL via SUBCUTANEOUS
  Filled 2020-12-26: qty 1

## 2020-12-26 NOTE — H&P (Signed)
Chief Complaint: Patient was seen in consultation today for port placement.  Referring Physician(s): Wyatt Portela  Supervising Physician: Jacqulynn Cadet  Patient Status: [facility] - Out-pt  History of Present Illness: Javier Sellers is a 70 y.o. male with a past medical history significant for HTN, HLD and recently diagnosed bladder cancer who presents today for port placement. [name] noted gross hematuria in May of this year and underwent CT abd/pelvis which showed bladder wall thickening involving the right mid urinary bladder. He underwent TURBT on 6/1 and pathology showed high-grade urothelial carcinoma. He is planned to begin systemic chemotherapy and IR has been asked to place a port for durable venous access.  Mr. Goettel denies any complaints today, he has been feeling well overall. He understands the procedure today and is agreeable to proceed as planned.  Past Medical History:  Diagnosis Date   Complication of anesthesia    vomiting only with ether   High cholesterol    Hypertension     Past Surgical History:  Procedure Laterality Date   APPENDECTOMY     BRONCHIAL BIOPSY  12/20/2020   Procedure: BRONCHIAL BIOPSIES;  Surgeon: Garner Nash, DO;  Location: [facility];  Service: Pulmonary;;   BRONCHIAL BRUSHINGS  12/20/2020   Procedure: BRONCHIAL BRUSHINGS;  Surgeon: Garner Nash, DO;  Location: [facility];  Service: Pulmonary;;   BRONCHIAL NEEDLE ASPIRATION BIOPSY  12/20/2020   Procedure: BRONCHIAL NEEDLE ASPIRATION BIOPSIES;  Surgeon: Garner Nash, DO;  Location: [place];  Service: Pulmonary;;   BRONCHIAL WASHINGS  12/20/2020   Procedure: BRONCHIAL WASHINGS;  Surgeon: Garner Nash, DO;  Location: [facility];  Service: Pulmonary;;   FIDUCIAL MARKER PLACEMENT  12/20/2020   Procedure: FIDUCIAL MARKER PLACEMENT;  Surgeon: Garner Nash, DO;  Location: [facility];  Service: Pulmonary;;   ROTATOR CUFF REPAIR Bilateral    VIDEO BRONCHOSCOPY WITH  ENDOBRONCHIAL NAVIGATION Left 12/20/2020   Procedure: VIDEO BRONCHOSCOPY WITH ENDOBRONCHIAL NAVIGATION;  Surgeon: Garner Nash, DO;  Location: [facility];  Service: Pulmonary;  Laterality: Left;    Allergies: Patient has no known allergies.  Medications: Prior to Admission medications   Medication Sig Start Date End Date Taking? Authorizing Provider  amLODipine (NORVASC) 5 MG tablet Take 5 mg by mouth every morning. 09/10/20  Yes [provider]  diclofenac Sodium (VOLTAREN) 1 % GEL Apply 1 application topically daily as needed (knee pain).   Yes [provider]  lisinopril (ZESTRIL) 40 MG tablet Take 40 mg by mouth every morning. 09/10/20  Yes [provider]  Menthol-Camphor (ICY HOT ADVANCED PAIN RELIEF) 16-11 % CREA Apply 1 application topically daily as needed (pain).   Yes [provider]  metoprolol succinate (TOPROL-XL) 100 MG 24 hr tablet Take 100 mg by mouth every morning. 09/10/20  Yes [provider]  pravastatin (PRAVACHOL) 80 MG tablet Take 80 mg by mouth at bedtime. 09/10/20  Yes [provider]  prochlorperazine (COMPAZINE) 10 MG tablet Take 1 tablet (10 mg total) by mouth every 6 (six) hours as needed for nausea or vomiting. 12/13/20  Yes Wyatt Portela, MD  tamsulosin (FLOMAX) 0.4 MG CAPS capsule Take 0.4 mg by mouth daily. 10/23/20  Yes [provider]  lidocaine-prilocaine (EMLA) cream Apply 1 application topically as needed. 12/13/20   Wyatt Portela, MD     History reviewed. No pertinent family history.  Social History   Socioeconomic History   Marital status: Married    Spouse name: Not on file  Number of children: Not on file   Years of education: Not on file   Highest education level: Not on file  Occupational History   Not on file  Tobacco Use   Smoking status: Former    Packs/day: 3.50    Years: 20.00    Pack years: 70.00    Types: Cigarettes    Quit date: 11/14/1990    Years since quitting:  30.1   Smokeless tobacco: Never  Vaping Use   Vaping Use: Never used  Substance and Sexual Activity   Alcohol use: Yes    Comment: 3 cans beer daily   Drug use: Never   Sexual activity: Not on file  Other Topics Concern   Not on file  Social History Narrative   Not on file   Social Determinants of Health   Financial Resource Strain: Not on file  Food Insecurity: Not on file  Transportation Needs: Not on file  Physical Activity: Not on file  Stress: Not on file  Social Connections: Not on file     Review of Systems: A 12 point ROS discussed and pertinent positives are indicated in the HPI above.  All other systems are negative.  Review of Systems  Constitutional:  Negative for appetite change, chills and fever.  Respiratory:  Negative for cough and shortness of breath.   Cardiovascular:  Negative for chest pain.  Gastrointestinal:  Negative for abdominal pain, nausea and vomiting.  Musculoskeletal:  Negative for back pain.  Neurological:  Negative for headaches.   Vital Signs: BP (!) 178/76   Pulse 71   Temp 98 F (36.7 C) (Oral)   Resp 16   SpO2 100%   Physical Exam Vitals reviewed.  Constitutional:      General: He is not in acute distress. HENT:     Head: Normocephalic.     Mouth/Throat:     Mouth: Mucous membranes are moist.     Pharynx: Oropharynx is clear. No oropharyngeal exudate or posterior oropharyngeal erythema.  Cardiovascular:     Rate and Rhythm: Normal rate and regular rhythm.  Pulmonary:     Effort: Pulmonary effort is normal.     Breath sounds: Normal breath sounds.  Abdominal:     General: There is no distension.     Palpations: Abdomen is soft.     Tenderness: There is no abdominal tenderness.  Skin:    General: Skin is warm and dry.  Neurological:     Mental Status: He is alert and oriented to person, place, and time.  Psychiatric:        Mood and Affect: Mood normal.        Behavior: Behavior normal.        Thought Content:  Thought content normal.        Judgment: Judgment normal.     MD Evaluation Airway: WNL Heart: WNL Abdomen: WNL Chest/ Lungs: WNL ASA  Classification: 2 Mallampati/Airway Score: One   Imaging: NM PET Image Initial (PI) Skull Base To Thigh  Result Date: 12/26/2020 CLINICAL DATA:  Subsequent treatment strategy for bladder cancer. EXAM: NUCLEAR MEDICINE PET SKULL BASE TO THIGH TECHNIQUE: 13.7 mCi F-18 FDG was injected intravenously. Full-ring PET imaging was performed from the skull base to thigh after the radiotracer. CT data was obtained and used for attenuation correction and anatomic localization. Fasting blood glucose: 149 mg/dl COMPARISON:  CT abdomen 10/28/2020 FINDINGS: Mediastinal blood pool activity: SUV max 0.9 Liver activity: SUV max NA NECK: No hypermetabolic lymph nodes in  the neck. Incidental CT findings: none CHEST: LEFT upper lobe pulmonary nodule measuring 22 mm (image 63) and has intense metabolic activity with SUV max equal 6.5. There are fiducial markers adjacent to this lesion. Intense metabolic activity associated with a small LEFT hilar lymph node with SUV max equal 9.0. Adjacent node which is smaller SUV max equal 4.3. Incidental CT findings: none ABDOMEN/PELVIS: There multiple intense radiotracer avid lesions throughout the LEFT and RIGHT hepatic lobes. Lesions are not appreciated on the noncontrast CT or on comparison contrast CT 10/28/2020. Example lesion in the lateral segment of the LEFT hepatic lobe with SUV max 13.3. This lesion is approximately 2 cm. Similar lesion in the posterior RIGHT hepatic lobe with SUV max equal 12.6. There approximately 30 lesions throughout the LEFT and RIGHT hepatic lobes No hypermetabolic abdominopelvic lymph nodes. There is asymmetric thickening of the RIGHT wall the bladder. Intense activity within the bladder related to radiotracer in the urine. No abnormal activity within prostate gland. Incidental CT findings: none SKELETON: Focal lesion  in the RIGHT acetabulum with SUV max equal 9.2 (image 198). No obvious CT correlation. Second skeletal lesion within the medial aspect of the RIGHT first rib with SUV max equal 10.1. There is a lytic lesion associated with this activity measuring 22 mm on image 49. Incidental CT findings: none IMPRESSION: 1. Hypermetabolic LEFT upper lobe pulmonary nodule consistent bronchogenic carcinoma. 2. Two adjacent hypermetabolic LEFT hilar nodal metastasis. 3. Multifocal intensely hypermetabolic HEPATIC METASTASIS. 4. Two hypermetabolic skeletal metastasis. One in RIGHT first rib and a second in the RIGHT acetabulum. 5. Asymmetric bladder wall thickening on the RIGHT. Electronically Signed   By: Suzy Bouchard M.D.   On: 12/26/2020 11:27   DG CHEST PORT 1 VIEW  Result Date: 12/20/2020 CLINICAL DATA:  Status post bronchoscopy of the left upper lobe. EXAM: PORTABLE CHEST 1 VIEW COMPARISON:  CT chest 12/18/2020 FINDINGS: Stable cardiomediastinal contours. No pleural effusion or edema. No pneumothorax identified status post bronchoscopy. There are 2 fiducial markers identified within the medial aspect of the left upper lobe corresponding to known pulmonary nodule. Hazy airspace opacification within the left upper lobe is identified likely reflecting post biopsy hemorrhage. Right lung appears clear. No pleural effusion or edema. IMPRESSION: 1. No pneumothorax status post bronchoscopy. 2. Hazy airspace opacification within the left upper lobe compatible with post biopsy hemorrhage. Electronically Signed   By: Kerby Moors M.D.   On: 12/20/2020 09:12   CT Super D Chest Wo Contrast  Result Date: 12/19/2020 CLINICAL DATA:  Recent diagnosis bladder cancer, pulmonary nodule EXAM: CT CHEST WITHOUT CONTRAST TECHNIQUE: Multidetector CT imaging of the chest was performed using thin slice collimation for electromagnetic bronchoscopy planning purposes, without intravenous contrast. COMPARISON:  11/26/2020 FINDINGS: Cardiovascular:  Aortic atherosclerosis. Dense aortic valve calcifications. Normal heart size. Left and right coronary artery calcifications. No pericardial effusion. Mediastinum/Nodes: No enlarged mediastinal, hilar, or axillary lymph nodes. Thyroid gland, trachea, and esophagus demonstrate no significant findings. Lungs/Pleura: Spiculated nodule of the paramedian left pulmonary apex measuring 2.3 x 1.6 cm, unchanged compared to recent prior examination (series 5, image 40). No pleural effusion or pneumothorax. Upper Abdomen: No acute abnormality. Musculoskeletal: No chest wall mass or suspicious bone lesions identified. IMPRESSION: 1. Spiculated nodule of the paramedian left pulmonary apex measuring 2.3 x 1.6 cm, unchanged compared to recent prior examination. This remains morphologically most concerning for synchronous primary lung malignancy; bladder metastatic disease not favored. 2. Dense aortic valve calcifications. Correlate for echocardiographic evidence of aortic valve dysfunction.  3. Coronary artery disease. Aortic Atherosclerosis (ICD10-I70.0). Electronically Signed   By: Eddie Candle M.D.   On: 12/19/2020 14:57   DG C-ARM BRONCHOSCOPY  Result Date: 12/20/2020 C-ARM BRONCHOSCOPY: Fluoroscopy was utilized by the requesting physician.  No radiographic interpretation.    Labs:  CBC: Recent Labs    11/13/20 2037 11/14/20 0330 12/20/20 0554  WBC  --   --  5.1  HGB 11.9* 10.3* 12.9*  HCT 35.2* 30.1* 39.4  PLT  --   --  159    COAGS: No results for input(s): INR, APTT in the last 8760 hours.  BMP: Recent Labs    11/14/20 0330 12/20/20 0554  NA 134* 138  K 4.2 4.3  CL 101 103  CO2 26 26  GLUCOSE 212* 131*  BUN 20 10  CALCIUM 8.3* 9.2  CREATININE 0.95 1.07  GFRNONAA >60 >60    LIVER FUNCTION TESTS: No results for input(s): BILITOT, AST, ALT, ALKPHOS, PROT, ALBUMIN in the last 8760 hours.  TUMOR MARKERS: No results for input(s): AFPTM, CEA, CA199, CHROMGRNA in the last 8760  hours.  Assessment and Plan:  70 y/o M with recently diagnosed bladder cancer planned to begin systemic chemotherapy who presents today for port placement for durable venous access.  Risks and benefits of image-guided Port-a-catheter placement were discussed with the patient including, but not limited to bleeding, infection, pneumothorax, or fibrin sheath development and need for additional procedures.  All of the patient's questions were answered, patient is agreeable to proceed.  Consent signed and in chart.  Thank you for this interesting consult.  I greatly enjoyed meeting SHIGEO BAUGH and look forward to participating in their care.  A copy of this report was sent to the requesting provider on this date.  Electronically Signed: Joaquim Nam, PA-C 12/26/2020, 1:33 PM   I spent a total of 30 Minutes   in face to face in clinical consultation, greater than 50% of which was counseling/coordinating care for port placement.

## 2020-12-26 NOTE — Discharge Instructions (Signed)
Urgent needs - Interventional Radiology on call MD [phone]  Wound - May remove dressing and shower in 24 to 48 hours.  Keep site clean and dry.  Replace with bandaid as needed.  Do not submerge in tub or water until site healing well. If closed with glue, glue will flake off on its own.  If ordered by your provider, may start Emla cream in 2 weeks or after incision is healed.  After completion of treatment, your provider should have you set up for monthly port flushes.   

## 2020-12-26 NOTE — Procedures (Signed)
Interventional Radiology Procedure Note  Procedure: Single Lumen Power Port Placement    Access:  Right IJ vein.  Findings: Catheter tip positioned at SVC/RA junction. Port is ready for immediate use.   Complications: None  EBL: < 10 mL  Recommendations:  - Ok to shower in 24 hours - Do not submerge for 7 days - Routine line care   Michaelle Birks, MD  Clinic: [phone]

## 2020-12-26 NOTE — Sedation Documentation (Signed)
Patient is resting comfortably. 

## 2020-12-27 ENCOUNTER — Other Ambulatory Visit: Payer: Self-pay

## 2020-12-27 ENCOUNTER — Inpatient Hospital Stay: Payer: Medicare Other

## 2020-12-27 VITALS — BP 172/74 | HR 61 | Temp 97.9°F | Resp 18

## 2020-12-27 DIAGNOSIS — C779 Secondary and unspecified malignant neoplasm of lymph node, unspecified: Secondary | ICD-10-CM | POA: Diagnosis not present

## 2020-12-27 DIAGNOSIS — C787 Secondary malignant neoplasm of liver and intrahepatic bile duct: Secondary | ICD-10-CM | POA: Diagnosis not present

## 2020-12-27 DIAGNOSIS — C679 Malignant neoplasm of bladder, unspecified: Secondary | ICD-10-CM | POA: Diagnosis not present

## 2020-12-27 DIAGNOSIS — Z95828 Presence of other vascular implants and grafts: Secondary | ICD-10-CM

## 2020-12-27 DIAGNOSIS — C7951 Secondary malignant neoplasm of bone: Secondary | ICD-10-CM | POA: Diagnosis not present

## 2020-12-27 DIAGNOSIS — Z5111 Encounter for antineoplastic chemotherapy: Secondary | ICD-10-CM | POA: Diagnosis not present

## 2020-12-27 LAB — CBC WITH DIFFERENTIAL (CANCER CENTER ONLY)
Abs Immature Granulocytes: 0 10*3/uL (ref 0.00–0.07)
Basophils Absolute: 0 10*3/uL (ref 0.0–0.1)
Basophils Relative: 1 %
Eosinophils Absolute: 0.1 10*3/uL (ref 0.0–0.5)
Eosinophils Relative: 1 %
HCT: 38.3 % — ABNORMAL LOW (ref 39.0–52.0)
Hemoglobin: 13 g/dL (ref 13.0–17.0)
Immature Granulocytes: 0 %
Lymphocytes Relative: 16 %
Lymphs Abs: 0.9 10*3/uL (ref 0.7–4.0)
MCH: 30.5 pg (ref 26.0–34.0)
MCHC: 33.9 g/dL (ref 30.0–36.0)
MCV: 89.9 fL (ref 80.0–100.0)
Monocytes Absolute: 0.4 10*3/uL (ref 0.1–1.0)
Monocytes Relative: 8 %
Neutro Abs: 4 10*3/uL (ref 1.7–7.7)
Neutrophils Relative %: 74 %
Platelet Count: 146 10*3/uL — ABNORMAL LOW (ref 150–400)
RBC: 4.26 MIL/uL (ref 4.22–5.81)
RDW: 13.1 % (ref 11.5–15.5)
WBC Count: 5.4 10*3/uL (ref 4.0–10.5)
nRBC: 0 % (ref 0.0–0.2)

## 2020-12-27 LAB — CMP (CANCER CENTER ONLY)
ALT: 35 U/L (ref 0–44)
AST: 38 U/L (ref 15–41)
Albumin: 3.7 g/dL (ref 3.5–5.0)
Alkaline Phosphatase: 113 U/L (ref 38–126)
Anion gap: 9 (ref 5–15)
BUN: 10 mg/dL (ref 8–23)
CO2: 25 mmol/L (ref 22–32)
Calcium: 9.3 mg/dL (ref 8.9–10.3)
Chloride: 102 mmol/L (ref 98–111)
Creatinine: 0.87 mg/dL (ref 0.61–1.24)
GFR, Estimated: >60 mL/min (ref >60)
Glucose, Bld: 130 mg/dL — ABNORMAL HIGH (ref 70–99)
Potassium: 4.3 mmol/L (ref 3.5–5.1)
Sodium: 136 mmol/L (ref 135–145)
Total Bilirubin: 0.8 mg/dL (ref 0.3–1.2)
Total Protein: 7.4 g/dL (ref 6.5–8.1)

## 2020-12-27 LAB — MAGNESIUM: Magnesium: 2.1 mg/dL (ref 1.7–2.4)

## 2020-12-27 MED ORDER — SODIUM CHLORIDE 0.9% FLUSH
10.0000 mL | INTRAVENOUS | Status: DC | PRN
  Administered 2020-12-27: 10 mL
  Filled 2020-12-27: qty 10

## 2020-12-27 MED ORDER — SODIUM CHLORIDE 0.9 % IV SOLN
150.0000 mg | Freq: Once | INTRAVENOUS | Status: AC
  Administered 2020-12-27: 150 mg via INTRAVENOUS
  Filled 2020-12-27: qty 150

## 2020-12-27 MED ORDER — POTASSIUM CHLORIDE IN NACL 20-0.9 MEQ/L-% IV SOLN
Freq: Once | INTRAVENOUS | Status: AC
  Filled 2020-12-27: qty 1000

## 2020-12-27 MED ORDER — MAGNESIUM SULFATE 2 GM/50ML IV SOLN
INTRAVENOUS | Status: AC
  Filled 2020-12-27: qty 50

## 2020-12-27 MED ORDER — HEPARIN SOD (PORK) LOCK FLUSH 100 UNIT/ML IV SOLN
500.0000 [IU] | Freq: Once | INTRAVENOUS | Status: AC | PRN
  Administered 2020-12-27: 500 [IU]
  Filled 2020-12-27: qty 5

## 2020-12-27 MED ORDER — PALONOSETRON HCL INJECTION 0.25 MG/5ML
INTRAVENOUS | Status: AC
  Filled 2020-12-27: qty 5

## 2020-12-27 MED ORDER — SODIUM CHLORIDE 0.9 % IV SOLN
1000.0000 mg/m2 | Freq: Once | INTRAVENOUS | Status: AC
  Administered 2020-12-27: 2508 mg via INTRAVENOUS
  Filled 2020-12-27: qty 65.96

## 2020-12-27 MED ORDER — SODIUM CHLORIDE 0.9 % IV SOLN
Freq: Once | INTRAVENOUS | Status: AC
  Filled 2020-12-27: qty 250

## 2020-12-27 MED ORDER — PALONOSETRON HCL INJECTION 0.25 MG/5ML
0.2500 mg | Freq: Once | INTRAVENOUS | Status: AC
  Administered 2020-12-27: 0.25 mg via INTRAVENOUS

## 2020-12-27 MED ORDER — DEXAMETHASONE SODIUM PHOSPHATE 100 MG/10ML IJ SOLN
10.0000 mg | Freq: Once | INTRAMUSCULAR | Status: AC
  Administered 2020-12-27: 10 mg via INTRAVENOUS
  Filled 2020-12-27: qty 10

## 2020-12-27 MED ORDER — SODIUM CHLORIDE 0.9% FLUSH
10.0000 mL | INTRAVENOUS | Status: AC | PRN
Start: 2020-12-27 — End: 2020-12-27
  Administered 2020-12-27: 10 mL
  Filled 2020-12-27: qty 10

## 2020-12-27 MED ORDER — SODIUM CHLORIDE 0.9 % IV SOLN
70.0000 mg/m2 | Freq: Once | INTRAVENOUS | Status: AC
  Administered 2020-12-27: 174 mg via INTRAVENOUS
  Filled 2020-12-27: qty 174

## 2020-12-27 MED ORDER — MAGNESIUM SULFATE 2 GM/50ML IV SOLN
2.0000 g | Freq: Once | INTRAVENOUS | Status: AC
Start: 2020-12-27 — End: 2020-12-27
  Administered 2020-12-27: 2 g via INTRAVENOUS

## 2020-12-27 NOTE — Patient Instructions (Signed)
[facility]  Discharge Instructions: Thank you for choosing [facility] to provide your oncology and hematology care.   If you have a lab appointment with the [facility], please go directly to the [facility] and check in at the registration area.   Wear comfortable clothing and clothing appropriate for easy access to any Portacath or PICC line.   We strive to give you quality time with your provider. You may need to reschedule your appointment if you arrive late (15 or more minutes).  Arriving late affects you and other patients whose appointments are after yours.  Also, if you miss three or more appointments without notifying the office, you may be dismissed from the clinic at the provider's discretion.      For prescription refill requests, have your pharmacy contact our office and allow 72 hours for refills to be completed.    Today you received the following chemotherapy and/or immunotherapy agents : Cisplatin, Gemzar    To help prevent nausea and vomiting after your treatment, we encourage you to take your nausea medication as directed.  BELOW ARE SYMPTOMS THAT SHOULD BE REPORTED IMMEDIATELY: *FEVER GREATER THAN 100.4 F (38 C) OR HIGHER *CHILLS OR SWEATING *NAUSEA AND VOMITING THAT IS NOT CONTROLLED WITH YOUR NAUSEA MEDICATION *UNUSUAL SHORTNESS OF BREATH *UNUSUAL BRUISING OR BLEEDING *URINARY PROBLEMS (pain or burning when urinating, or frequent urination) *BOWEL PROBLEMS (unusual diarrhea, constipation, pain near the anus) TENDERNESS IN MOUTH AND THROAT WITH OR WITHOUT PRESENCE OF ULCERS (sore throat, sores in mouth, or a toothache) UNUSUAL RASH, SWELLING OR PAIN  UNUSUAL VAGINAL DISCHARGE OR ITCHING   Items with * indicate a potential emergency and should be followed up as soon as possible or go to the Emergency Department if any problems should occur.  Please show the CHEMOTHERAPY ALERT CARD or IMMUNOTHERAPY ALERT CARD at  check-in to the Emergency Department and triage nurse.  Should you have questions after your visit or need to cancel or reschedule your appointment, please contact [facility]  Dept: [phone]  and follow the prompts.  Office hours are 8:00 a.m. to 4:30 p.m. Monday - Friday. Please note that voicemails left after 4:00 p.m. may not be returned until the following business day.  We are closed weekends and major holidays. You have access to a nurse at all times for urgent questions. Please call the main number to the clinic Dept: [phone] and follow the prompts.   For any non-urgent questions, you may also contact your provider using MyChart. We now offer e-Visits for anyone 69 and older to request care online for non-urgent symptoms. For details visit [email or web address].   Also download the MyChart app! Go to the app store, search "MyChart", open the app, select [facility], and log in with your MyChart username and password.  Due to Covid, a mask is required upon entering the hospital/clinic. If you do not have a mask, one will be given to you upon arrival. For doctor visits, patients may have 1 support person aged 69 or older with them. For treatment visits, patients cannot have anyone with them due to current Covid guidelines and our immunocompromised population.

## 2021-01-02 ENCOUNTER — Inpatient Hospital Stay (HOSPITAL_BASED_OUTPATIENT_CLINIC_OR_DEPARTMENT_OTHER): Payer: Medicare Other | Admitting: Oncology

## 2021-01-02 ENCOUNTER — Inpatient Hospital Stay: Payer: Medicare Other

## 2021-01-02 ENCOUNTER — Other Ambulatory Visit: Payer: Self-pay

## 2021-01-02 VITALS — BP 154/74 | HR 62 | Temp 98.2°F | Resp 18 | Ht 71.0 in | Wt 261.4 lb

## 2021-01-02 DIAGNOSIS — Z95828 Presence of other vascular implants and grafts: Secondary | ICD-10-CM

## 2021-01-02 DIAGNOSIS — C779 Secondary and unspecified malignant neoplasm of lymph node, unspecified: Secondary | ICD-10-CM | POA: Diagnosis not present

## 2021-01-02 DIAGNOSIS — C679 Malignant neoplasm of bladder, unspecified: Secondary | ICD-10-CM

## 2021-01-02 DIAGNOSIS — C787 Secondary malignant neoplasm of liver and intrahepatic bile duct: Secondary | ICD-10-CM | POA: Diagnosis not present

## 2021-01-02 DIAGNOSIS — C7951 Secondary malignant neoplasm of bone: Secondary | ICD-10-CM | POA: Diagnosis not present

## 2021-01-02 DIAGNOSIS — Z5111 Encounter for antineoplastic chemotherapy: Secondary | ICD-10-CM | POA: Diagnosis not present

## 2021-01-02 LAB — CMP (CANCER CENTER ONLY)
ALT: 60 U/L — ABNORMAL HIGH (ref 0–44)
AST: 29 U/L (ref 15–41)
Albumin: 3.9 g/dL (ref 3.5–5.0)
Alkaline Phosphatase: 80 U/L (ref 38–126)
Anion gap: 8 (ref 5–15)
BUN: 25 mg/dL — ABNORMAL HIGH (ref 8–23)
CO2: 27 mmol/L (ref 22–32)
Calcium: 9.2 mg/dL (ref 8.9–10.3)
Chloride: 102 mmol/L (ref 98–111)
Creatinine: 0.99 mg/dL (ref 0.61–1.24)
GFR, Estimated: >60 mL/min (ref >60)
Glucose, Bld: 111 mg/dL — ABNORMAL HIGH (ref 70–99)
Potassium: 3.9 mmol/L (ref 3.5–5.1)
Sodium: 137 mmol/L (ref 135–145)
Total Bilirubin: 0.7 mg/dL (ref 0.3–1.2)
Total Protein: 7.5 g/dL (ref 6.5–8.1)

## 2021-01-02 LAB — CBC WITH DIFFERENTIAL (CANCER CENTER ONLY)
Abs Immature Granulocytes: 0.01 10*3/uL (ref 0.00–0.07)
Basophils Absolute: 0.1 10*3/uL (ref 0.0–0.1)
Basophils Relative: 2 %
Eosinophils Absolute: 0.1 10*3/uL (ref 0.0–0.5)
Eosinophils Relative: 2 %
HCT: 38.6 % — ABNORMAL LOW (ref 39.0–52.0)
Hemoglobin: 13.3 g/dL (ref 13.0–17.0)
Immature Granulocytes: 0 %
Lymphocytes Relative: 30 %
Lymphs Abs: 0.9 10*3/uL (ref 0.7–4.0)
MCH: 30.6 pg (ref 26.0–34.0)
MCHC: 34.5 g/dL (ref 30.0–36.0)
MCV: 88.9 fL (ref 80.0–100.0)
Monocytes Absolute: 0.1 10*3/uL (ref 0.1–1.0)
Monocytes Relative: 3 %
Neutro Abs: 1.9 10*3/uL (ref 1.7–7.7)
Neutrophils Relative %: 63 %
Platelet Count: 120 10*3/uL — ABNORMAL LOW (ref 150–400)
RBC: 4.34 MIL/uL (ref 4.22–5.81)
RDW: 12.7 % (ref 11.5–15.5)
WBC Count: 3 10*3/uL — ABNORMAL LOW (ref 4.0–10.5)
nRBC: 0 % (ref 0.0–0.2)

## 2021-01-02 MED ORDER — SODIUM CHLORIDE 0.9 % IV SOLN
Freq: Once | INTRAVENOUS | Status: AC
  Filled 2021-01-02: qty 250

## 2021-01-02 MED ORDER — PROCHLORPERAZINE MALEATE 10 MG PO TABS
ORAL_TABLET | ORAL | Status: AC
  Filled 2021-01-02: qty 1

## 2021-01-02 MED ORDER — PROCHLORPERAZINE MALEATE 10 MG PO TABS
10.0000 mg | ORAL_TABLET | Freq: Once | ORAL | Status: AC
  Administered 2021-01-02: 10 mg via ORAL

## 2021-01-02 MED ORDER — SODIUM CHLORIDE 0.9% FLUSH
10.0000 mL | INTRAVENOUS | Status: DC | PRN
  Filled 2021-01-02: qty 10

## 2021-01-02 MED ORDER — SODIUM CHLORIDE 0.9% FLUSH
10.0000 mL | Freq: Once | INTRAVENOUS | Status: AC
  Administered 2021-01-02: 10 mL
  Filled 2021-01-02: qty 10

## 2021-01-02 MED ORDER — SODIUM CHLORIDE 0.9 % IV SOLN
1000.0000 mg/m2 | Freq: Once | INTRAVENOUS | Status: AC
  Administered 2021-01-02: 2508 mg via INTRAVENOUS
  Filled 2021-01-02: qty 65.96

## 2021-01-02 MED ORDER — HEPARIN SOD (PORK) LOCK FLUSH 100 UNIT/ML IV SOLN
500.0000 [IU] | Freq: Once | INTRAVENOUS | Status: DC | PRN
  Filled 2021-01-02: qty 5

## 2021-01-02 NOTE — Patient Instructions (Signed)
[facility]  Discharge Instructions: Thank you for choosing [facility] to provide your oncology and hematology care.   If you have a lab appointment with the Cancer Center, please go directly to the Cancer Center and check in at the registration area.   Wear comfortable clothing and clothing appropriate for easy access to any Portacath or PICC line.   We strive to give you quality time with your provider. You may need to reschedule your appointment if you arrive late (15 or more minutes).  Arriving late affects you and other patients whose appointments are after yours.  Also, if you miss three or more appointments without notifying the office, you may be dismissed from the clinic at the provider's discretion.      For prescription refill requests, have your pharmacy contact our office and allow 72 hours for refills to be completed.    Today you received the following chemotherapy and/or immunotherapy agents Gemzar      To help prevent nausea and vomiting after your treatment, we encourage you to take your nausea medication as directed.  BELOW ARE SYMPTOMS THAT SHOULD BE REPORTED IMMEDIATELY: *FEVER GREATER THAN 100.4 F (38 C) OR HIGHER *CHILLS OR SWEATING *NAUSEA AND VOMITING THAT IS NOT CONTROLLED WITH YOUR NAUSEA MEDICATION *UNUSUAL SHORTNESS OF BREATH *UNUSUAL BRUISING OR BLEEDING *URINARY PROBLEMS (pain or burning when urinating, or frequent urination) *BOWEL PROBLEMS (unusual diarrhea, constipation, pain near the anus) TENDERNESS IN MOUTH AND THROAT WITH OR WITHOUT PRESENCE OF ULCERS (sore throat, sores in mouth, or a toothache) UNUSUAL RASH, SWELLING OR PAIN  UNUSUAL VAGINAL DISCHARGE OR ITCHING   Items with * indicate a potential emergency and should be followed up as soon as possible or go to the Emergency Department if any problems should occur.  Please show the CHEMOTHERAPY ALERT CARD or IMMUNOTHERAPY ALERT CARD at check-in to the  Emergency Department and triage nurse.  Should you have questions after your visit or need to cancel or reschedule your appointment, please contact [facility]  Dept: [phone]  and follow the prompts.  Office hours are 8:00 a.m. to 4:30 p.m. Monday - Friday. Please note that voicemails left after 4:00 p.m. may not be returned until the following business day.  We are closed weekends and major holidays. You have access to a nurse at all times for urgent questions. Please call the main number to the clinic Dept: [phone] and follow the prompts.   For any non-urgent questions, you may also contact your provider using MyChart. We now offer e-Visits for anyone 69 and older to request care online for non-urgent symptoms. For details visit [email or web address].   Also download the MyChart app! Go to the app store, search "MyChart", open the app, select [facility], and log in with your MyChart username and password.  Due to Covid, a mask is required upon entering the hospital/clinic. If you do not have a mask, one will be given to you upon arrival. For doctor visits, patients may have 1 support person aged 69 or older with them. For treatment visits, patients cannot have anyone with them due to current Covid guidelines and our immunocompromised population.   

## 2021-01-02 NOTE — Progress Notes (Signed)
Hematology and Oncology Follow Up Visit  Javier Sellers [id number] 1951-02-14 70 y.o. 01/02/2021 8:10 AM [name], [place], [name], MD   Principle Diagnosis: 70 year old man with bladder cancer diagnosed in May 2022.  He was found to have high-grade urothelial carcinoma with small cell component and documented stage IV disease and hepatic metastasis.   Secondary diagnosis: Left lung neoplasm consistent with non-small cell lung cancer diagnosed in July 2022.  Prior Therapy: He underwent TURBT on November 13, 2020 and the final pathology revealed high-grade urothelial carcinoma with small cell component of 70%.  Current therapy: Chemotherapy utilizing gemcitabine and cisplatin started on December 27, 2020.  He is here for day 8 of cycle 1 of therapy.  Interim History: [name] returns today for a follow-up visit.  Since the last visit, he has started the first cycle of chemotherapy without any major complications.  He did report some episode of nausea associated with taking Compazine.  He denies any vomiting or abdominal pain.  He denies any diarrhea.  He denies any worsening neuropathy or excessive fatigue.  His performance status quality of life remained excellent.     Medications: I have reviewed the patient's current medications.  Current Outpatient Medications  Medication Sig Dispense Refill   amLODipine (NORVASC) 5 MG tablet Take 5 mg by mouth every morning.     diclofenac Sodium (VOLTAREN) 1 % GEL Apply 1 application topically daily as needed (knee pain).     lidocaine-prilocaine (EMLA) cream Apply 1 application topically as needed. 30 g 0   lisinopril (ZESTRIL) 40 MG tablet Take 40 mg by mouth every morning.     Menthol-Camphor (ICY HOT ADVANCED PAIN RELIEF) 16-11 % CREA Apply 1 application topically daily as needed (pain).     metoprolol succinate (TOPROL-XL) 100 MG 24 hr tablet Take 100 mg by mouth every morning.     pravastatin (PRAVACHOL) 80 MG tablet Take 80 mg by mouth  at bedtime.     prochlorperazine (COMPAZINE) 10 MG tablet Take 1 tablet (10 mg total) by mouth every 6 (six) hours as needed for nausea or vomiting. 30 tablet 0   tamsulosin (FLOMAX) 0.4 MG CAPS capsule Take 0.4 mg by mouth daily.     No current facility-administered medications for this visit.   Facility-Administered Medications Ordered in Other Visits  Medication Dose Route Frequency Provider Last Rate Last Admin   sodium chloride flush (NS) 0.9 % injection 10 mL  10 mL Intracatheter Once Wyatt Portela, MD         Allergies: No Known Allergies   Physical Exam: Blood pressure (!) 154/74, pulse 62, temperature 98.2 F (36.8 C), temperature source Oral, resp. rate 18, height 5\' 11"  (1.803 m), weight 261 lb 6.4 oz (118.6 kg), SpO2 99 %.  ECOG: 1    General appearance: Alert, awake without any distress. Head: Atraumatic without abnormalities Oropharynx: Without any thrush or ulcers. Eyes: No scleral icterus. Lymph nodes: No lymphadenopathy noted in the cervical, supraclavicular, or axillary nodes Heart:regular rate and rhythm, without any murmurs or gallops.   Lung: Clear to auscultation without any rhonchi, wheezes or dullness to percussion. Abdomin: Soft, nontender without any shifting dullness or ascites. Musculoskeletal: No clubbing or cyanosis. Neurological: No motor or sensory deficits. Skin: No rashes or lesions.    Lab Results: Lab Results  Component Value Date   WBC 5.4 12/27/2020   HGB 13.0 12/27/2020   HCT 38.3 (L) 12/27/2020   MCV 89.9 12/27/2020  PLT 146 (L) 12/27/2020     Chemistry      Component Value Date/Time   NA 136 12/27/2020 0818   K 4.3 12/27/2020 0818   CL 102 12/27/2020 0818   CO2 25 12/27/2020 0818   BUN 10 12/27/2020 0818   CREATININE 0.87 12/27/2020 0818      Component Value Date/Time   CALCIUM 9.3 12/27/2020 0818   ALKPHOS 113 12/27/2020 0818   AST 38 12/27/2020 0818   ALT 35 12/27/2020 0818   BILITOT 0.8 12/27/2020 0818        IMPRESSION: 1. Hypermetabolic LEFT upper lobe pulmonary nodule consistent bronchogenic carcinoma. 2. Two adjacent hypermetabolic LEFT hilar nodal metastasis. 3. Multifocal intensely hypermetabolic HEPATIC METASTASIS. 4. Two hypermetabolic skeletal metastasis. One in RIGHT first rib and a second in the RIGHT acetabulum. 5. Asymmetric bladder wall thickening on the RIGHT.    Impression and Plan:  70 year old man with:  1.  Stage IV bladder cancer and found to have high-grade urothelial carcinoma with muscle invasion and small cell component.  He has also documented hepatic Metastasis.   His disease status was updated at this time and treatment options were discussed.  Systemic chemotherapy remains the main treatment choice at this time moving forward.  The duration of chemotherapy would be for 6 cycles given his advanced disease.  I will update his staging scans after 3 cycles to ensure appropriate response.  After discussion today, he is agreeable to proceed.     2.  IV access: Port-A-Cath inserted without any complications.   3.  Antiemetics: No nausea or vomiting reported at this time.  Compazine is available to him.   4.  Renal function surveillance: will continue to monitor on cisplatin therapy.  Baseline kidney function is normal.   5.  Goals of care: His disease appears to be incurable although aggressive measures are warranted at this time.   6.  Lung neoplasm: Given his metastatic disease, definitive therapy for his lung nodule is likely unnecessary and systemic chemotherapy should address his disease.   7.  Follow-up: In 2 weeks for the start of cycle 2 of therapy.   30  minutes were spent on this encounter.  Time was dedicated to reviewing his disease status, reviewing imaging studies, treatment options and addressing complications related to cancer and cancer therapy.     Zola Button, MD 7/21/20228:10 AM

## 2021-01-08 ENCOUNTER — Other Ambulatory Visit: Payer: Self-pay

## 2021-01-08 ENCOUNTER — Ambulatory Visit (INDEPENDENT_AMBULATORY_CARE_PROVIDER_SITE_OTHER): Payer: Medicare Other | Admitting: Pulmonary Disease

## 2021-01-08 ENCOUNTER — Encounter: Payer: Self-pay | Admitting: Pulmonary Disease

## 2021-01-08 VITALS — BP 152/78 | HR 78 | Ht 71.0 in | Wt 261.0 lb

## 2021-01-08 DIAGNOSIS — C3412 Malignant neoplasm of upper lobe, left bronchus or lung: Secondary | ICD-10-CM | POA: Diagnosis not present

## 2021-01-08 DIAGNOSIS — C679 Malignant neoplasm of bladder, unspecified: Secondary | ICD-10-CM

## 2021-01-08 DIAGNOSIS — R911 Solitary pulmonary nodule: Secondary | ICD-10-CM | POA: Diagnosis not present

## 2021-01-08 DIAGNOSIS — R918 Other nonspecific abnormal finding of lung field: Secondary | ICD-10-CM

## 2021-01-08 NOTE — Progress Notes (Signed)
Synopsis: Referred in June 2022 for lung nodule by [name], MD  Subjective:   PATIENT ID: Javier Sellers GENDER: male DOB: 1951/05/15, MRN: [id number]  No chief complaint on file.   70 year old gentleman, past medical history of high cholesterol, hypertension, former smoker quit 1992, 70-pack-year history.Presents today for evaluation of abnormal imaging.Patient had follow-up with urology, muscle invasive bladder cancer.  Bladder pathology had small cell features.  Appointment scheduled with Dr. Alen Blew from medical oncology.  Found to have a 2.3 cm spiculated lesion within the left upper lobe this was concerning for a primary lung malignancy.  CT scan of the chest was completed at [facility].  CT imaging is viewable in PACS system.  2.3 x 1.6 cm left lung apical, medial spiculated nodule concerning for primary bronchogenic carcinoma.  Patient already has a PET scan that has been ordered but not completed yet.  From a respiratory standpoint he is doing well today.  Overall anxious regarding recent diagnosis and abnormal CT imaging results.  OV 01/08/2021: Here today for follow-up after bronchoscopy.  Patient was diagnosed with a primary lung malignancy.  Also undergoing treatments for bladder cancer.Patient had PET scan following bronchoscopy which revealed multifocal metastasis within the liver.  Also has 2 hypermetabolic skeletal metastasis.  Patient is currently undergoing his chemotherapy and doing well with this.  From a respiratory standpoint he is stable.  Does not have any significant shortness of breath.  Denies hemoptysis additionally his weight has been stable.   Past Medical History:  Diagnosis Date   Complication of anesthesia    vomiting only with ether   High cholesterol    Hypertension      No family history on file.   Past Surgical History:  Procedure Laterality Date   APPENDECTOMY     BRONCHIAL BIOPSY  12/20/2020   Procedure: BRONCHIAL BIOPSIES;  Surgeon:  Garner Nash, DO;  Location: [place] [facility];  Service: Pulmonary;;   BRONCHIAL BRUSHINGS  12/20/2020   Procedure: BRONCHIAL BRUSHINGS;  Surgeon: Garner Nash, DO;  Location: [place];  Service: Pulmonary;;   BRONCHIAL NEEDLE ASPIRATION BIOPSY  12/20/2020   Procedure: BRONCHIAL NEEDLE ASPIRATION BIOPSIES;  Surgeon: Garner Nash, DO;  Location: [place];  Service: Pulmonary;;   BRONCHIAL WASHINGS  12/20/2020   Procedure: BRONCHIAL WASHINGS;  Surgeon: Garner Nash, DO;  Location: [place];  Service: Pulmonary;;   FIDUCIAL MARKER PLACEMENT  12/20/2020   Procedure: FIDUCIAL MARKER PLACEMENT;  Surgeon: Garner Nash, DO;  Location: [place];  Service: Pulmonary;;   IR IMAGING GUIDED PORT INSERTION  12/26/2020   ROTATOR CUFF REPAIR Bilateral    VIDEO BRONCHOSCOPY WITH ENDOBRONCHIAL NAVIGATION Left 12/20/2020   Procedure: VIDEO BRONCHOSCOPY WITH ENDOBRONCHIAL NAVIGATION;  Surgeon: Garner Nash, DO;  Location: [place];  Service: Pulmonary;  Laterality: Left;    Social History   Socioeconomic History   Marital status: Married    Spouse name: Not on file   Number of children: Not on file   Years of education: Not on file   Highest education level: Not on file  Occupational History   Not on file  Tobacco Use   Smoking status: Former    Packs/day: 3.50    Years: 20.00    Pack years: 70.00    Types: Cigarettes    Quit date: 11/14/1990    Years since quitting: 30.1   Smokeless tobacco: Never  Vaping Use   Vaping Use: Never used  Substance and Sexual  Activity   Alcohol use: Yes    Comment: 3 cans beer daily   Drug use: Never   Sexual activity: Not on file  Other Topics Concern   Not on file  Social History Narrative   Not on file   Social Determinants of Health   Financial Resource Strain: Not on file  Food Insecurity: Not on file  Transportation Needs: Not on file  Physical Activity: Not on file  Stress: Not on file  Social Connections: Not on file   Intimate Partner Violence: Not on file     No Known Allergies   Outpatient Medications Prior to Visit  Medication Sig Dispense Refill   amLODipine (NORVASC) 5 MG tablet Take 5 mg by mouth every morning.     diclofenac Sodium (VOLTAREN) 1 % GEL Apply 1 application topically daily as needed (knee pain).     lidocaine-prilocaine (EMLA) cream Apply 1 application topically as needed. 30 g 0   lisinopril (ZESTRIL) 40 MG tablet Take 40 mg by mouth every morning.     Menthol-Camphor (ICY HOT ADVANCED PAIN RELIEF) 16-11 % CREA Apply 1 application topically daily as needed (pain).     metoprolol succinate (TOPROL-XL) 100 MG 24 hr tablet Take 100 mg by mouth every morning.     pravastatin (PRAVACHOL) 80 MG tablet Take 80 mg by mouth at bedtime.     prochlorperazine (COMPAZINE) 10 MG tablet Take 1 tablet (10 mg total) by mouth every 6 (six) hours as needed for nausea or vomiting. 30 tablet 0   tamsulosin (FLOMAX) 0.4 MG CAPS capsule Take 0.4 mg by mouth daily.     No facility-administered medications prior to visit.    Review of Systems  Constitutional:  Negative for chills, fever, malaise/fatigue and weight loss.  HENT:  Negative for hearing loss, sore throat and tinnitus.   Eyes:  Negative for blurred vision and double vision.  Respiratory:  Negative for cough, hemoptysis, sputum production, shortness of breath, wheezing and stridor.   Cardiovascular:  Negative for chest pain, palpitations, orthopnea, leg swelling and PND.  Gastrointestinal:  Negative for abdominal pain, constipation, diarrhea, heartburn, nausea and vomiting.  Genitourinary:  Negative for dysuria, hematuria and urgency.  Musculoskeletal:  Negative for joint pain and myalgias.  Skin:  Negative for itching and rash.  Neurological:  Negative for dizziness, tingling, weakness and headaches.  Endo/Heme/Allergies:  Negative for environmental allergies. Does not bruise/bleed easily.  Psychiatric/Behavioral:  Negative for depression.  The patient is not nervous/anxious and does not have insomnia.   All other systems reviewed and are negative.   Objective:  Physical Exam Vitals reviewed.  Constitutional:      General: He is not in acute distress.    Appearance: He is well-developed. He is obese.  HENT:     Head: Normocephalic and atraumatic.  Eyes:     General: No scleral icterus.    Conjunctiva/sclera: Conjunctivae normal.     Pupils: Pupils are equal, round, and reactive to light.  Neck:     Vascular: No JVD.     Trachea: No tracheal deviation.  Cardiovascular:     Rate and Rhythm: Normal rate and regular rhythm.     Heart sounds: Normal heart sounds. No murmur heard. Pulmonary:     Effort: Pulmonary effort is normal. No tachypnea, accessory muscle usage or respiratory distress.     Breath sounds: Normal breath sounds. No stridor. No wheezing, rhonchi or rales.  Abdominal:     General: Bowel sounds are normal.  There is no distension.     Palpations: Abdomen is soft.     Tenderness: There is no abdominal tenderness.  Musculoskeletal:        General: No tenderness.     Cervical back: Neck supple.  Lymphadenopathy:     Cervical: No cervical adenopathy.  Skin:    General: Skin is warm and dry.     Capillary Refill: Capillary refill takes less than 2 seconds.     Findings: No rash.  Neurological:     Mental Status: He is alert and oriented to person, place, and time.  Psychiatric:        Behavior: Behavior normal.     Vitals:   01/08/21 1104  BP: (!) 152/78  Pulse: 78  SpO2: 99%  Weight: 261 lb (118.4 kg)  Height: 5\' 11"  (1.803 m)   99% on RA BMI Readings from Last 3 Encounters:  01/08/21 36.40 kg/m  01/02/21 36.46 kg/m  12/20/20 38.35 kg/m   Wt Readings from Last 3 Encounters:  01/08/21 261 lb (118.4 kg)  01/02/21 261 lb 6.4 oz (118.6 kg)  12/20/20 275 lb (124.7 kg)     CBC    Component Value Date/Time   WBC 3.0 (L) 01/02/2021 0810   WBC 5.1 12/20/2020 0554   RBC 4.34  01/02/2021 0810   HGB 13.3 01/02/2021 0810   HCT 38.6 (L) 01/02/2021 0810   PLT 120 (L) 01/02/2021 0810   MCV 88.9 01/02/2021 0810   MCH 30.6 01/02/2021 0810   MCHC 34.5 01/02/2021 0810   RDW 12.7 01/02/2021 0810   LYMPHSABS 0.9 01/02/2021 0810   MONOABS 0.1 01/02/2021 0810   EOSABS 0.1 01/02/2021 0810   BASOSABS 0.1 01/02/2021 0810     Chest Imaging: June 2022 CT chest: 2.3 x 1.6 spiculated left upper lobe nodule concerning for primary bronchogenic carcinoma. The patient's images have been independently reviewed by me.    Pulmonary Functions Testing Results: PFT Results Latest Ref Rng & Units 12/09/2020  FVC-Pre L 3.65  FVC-Predicted Pre % 79  FVC-Post L 3.93  FVC-Predicted Post % 85  Pre FEV1/FVC % % 71  Post FEV1/FCV % % 70  FEV1-Pre L 2.60  FEV1-Predicted Pre % 76  FEV1-Post L 2.75  DLCO uncorrected ml/min/mmHg 25.72  DLCO UNC% % 96  DLCO corrected ml/min/mmHg 30.14  DLCO COR %Predicted % 113  DLVA Predicted % 108  TLC L 6.15  TLC % Predicted % 86  RV % Predicted % 90    FeNO:   Pathology:   Echocardiogram:   Heart Catheterization:     Assessment & Plan:     ICD-10-CM   1. Malignant neoplasm of upper lobe of left lung (HCC)  C34.12     2. Lung mass  R91.8     3. Left upper lobe pulmonary nodule  R91.1     4. Malignant neoplasm of urinary bladder, unspecified site [facility])  C67.9       Discussion:  This is a a 70 year old gentleman, bladder cancer, stage IV, small cell component with hepatic metastasis.  Also has a secondary left lung cancer status post bronchoscopy.  We reviewed pathology results today in the office.  Plan: Here today for follow-up post bronchoscopy to make sure he is doing well. From respiratory standpoint he is stable. Patient was given a lung cancer support group, [employer]. Patient to follow-up with [place] in 6 months or as needed.    Current Outpatient Medications:    amLODipine (NORVASC) 5  MG tablet, Take 5 mg by mouth  every morning., Disp: , Rfl:    diclofenac Sodium (VOLTAREN) 1 % GEL, Apply 1 application topically daily as needed (knee pain)., Disp: , Rfl:    lidocaine-prilocaine (EMLA) cream, Apply 1 application topically as needed., Disp: 30 g, Rfl: 0   lisinopril (ZESTRIL) 40 MG tablet, Take 40 mg by mouth every morning., Disp: , Rfl:    Menthol-Camphor (ICY HOT ADVANCED PAIN RELIEF) 16-11 % CREA, Apply 1 application topically daily as needed (pain)., Disp: , Rfl:    metoprolol succinate (TOPROL-XL) 100 MG 24 hr tablet, Take 100 mg by mouth every morning., Disp: , Rfl:    pravastatin (PRAVACHOL) 80 MG tablet, Take 80 mg by mouth at bedtime., Disp: , Rfl:    prochlorperazine (COMPAZINE) 10 MG tablet, Take 1 tablet (10 mg total) by mouth every 6 (six) hours as needed for nausea or vomiting., Disp: 30 tablet, Rfl: 0   tamsulosin (FLOMAX) 0.4 MG CAPS capsule, Take 0.4 mg by mouth daily., Disp: , Rfl:    Garner Nash, DO [facility] 01/08/2021 11:14 AM

## 2021-01-08 NOTE — Patient Instructions (Signed)
Thank you for visiting Dr. Valeta Harms at [facility]. Today we recommend the following:  If symptoms changes please let me know.   Return in about 6 months (around 07/11/2021) for w/ Dr. Valeta Harms .    Please do your part to reduce the spread of COVID-19.

## 2021-01-08 NOTE — Progress Notes (Signed)
Synopsis: Referred in June 2022 for lung nodule by [name], MD  Subjective:   PATIENT ID: Javier Sellers GENDER: male DOB: 1950/12/10, MRN: [id number]  Chief Complaint  Patient presents with   Follow-up    Bronchoscopy follow up     70 year old gentleman, past medical history of high cholesterol, hypertension, former smoker quit 1992, 70-pack-year history.Presents today for evaluation of abnormal imaging.Patient had follow-up with urology, muscle invasive bladder cancer.  Bladder pathology had small cell features.  Appointment scheduled with Dr. Alen Blew from medical oncology.  Found to have a 2.3 cm spiculated lesion within the left upper lobe this was concerning for a primary lung malignancy.  CT scan of the chest was completed at [facility]  CT imaging is viewable in PACS system.  2.3 x 1.6 cm left lung apical, medial spiculated nodule concerning for primary bronchogenic carcinoma.  Patient already has a PET scan that has been ordered but not completed yet.  From a respiratory standpoint he is doing well today.  Overall anxious regarding recent diagnosis and abnormal CT imaging results.    Past Medical History:  Diagnosis Date   Complication of anesthesia    vomiting only with ether   High cholesterol    Hypertension      No family history on file.   Past Surgical History:  Procedure Laterality Date   APPENDECTOMY     BRONCHIAL BIOPSY  12/20/2020   Procedure: BRONCHIAL BIOPSIES;  Surgeon: Garner Nash, DO;  Location: [place] [facility];  Service: Pulmonary;;   BRONCHIAL BRUSHINGS  12/20/2020   Procedure: BRONCHIAL BRUSHINGS;  Surgeon: Garner Nash, DO;  Location: [place];  Service: Pulmonary;;   BRONCHIAL NEEDLE ASPIRATION BIOPSY  12/20/2020   Procedure: BRONCHIAL NEEDLE ASPIRATION BIOPSIES;  Surgeon: Garner Nash, DO;  Location: [place];  Service: Pulmonary;;   BRONCHIAL WASHINGS  12/20/2020   Procedure: BRONCHIAL WASHINGS;  Surgeon: Garner Nash,  DO;  Location: [place];  Service: Pulmonary;;   FIDUCIAL MARKER PLACEMENT  12/20/2020   Procedure: FIDUCIAL MARKER PLACEMENT;  Surgeon: Garner Nash, DO;  Location: [place];  Service: Pulmonary;;   IR IMAGING GUIDED PORT INSERTION  12/26/2020   ROTATOR CUFF REPAIR Bilateral    VIDEO BRONCHOSCOPY WITH ENDOBRONCHIAL NAVIGATION Left 12/20/2020   Procedure: VIDEO BRONCHOSCOPY WITH ENDOBRONCHIAL NAVIGATION;  Surgeon: Garner Nash, DO;  Location: [place];  Service: Pulmonary;  Laterality: Left;    Social History   Socioeconomic History   Marital status: Married    Spouse name: Not on file   Number of children: Not on file   Years of education: Not on file   Highest education level: Not on file  Occupational History   Not on file  Tobacco Use   Smoking status: Former    Packs/day: 3.50    Years: 20.00    Pack years: 70.00    Types: Cigarettes    Quit date: 11/14/1990    Years since quitting: 30.1   Smokeless tobacco: Never  Vaping Use   Vaping Use: Never used  Substance and Sexual Activity   Alcohol use: Yes    Comment: 3 cans beer daily   Drug use: Never   Sexual activity: Not on file  Other Topics Concern   Not on file  Social History Narrative   Not on file   Social Determinants of Health   Financial Resource Strain: Not on file  Food Insecurity: Not on file  Transportation Needs: Not on  file  Physical Activity: Not on file  Stress: Not on file  Social Connections: Not on file  Intimate Partner Violence: Not on file     No Known Allergies   Outpatient Medications Prior to Visit  Medication Sig Dispense Refill   amLODipine (NORVASC) 5 MG tablet Take 5 mg by mouth every morning.     diclofenac Sodium (VOLTAREN) 1 % GEL Apply 1 application topically daily as needed (knee pain).     lidocaine-prilocaine (EMLA) cream Apply 1 application topically as needed. 30 g 0   lisinopril (ZESTRIL) 40 MG tablet Take 40 mg by mouth every morning.      Menthol-Camphor (ICY HOT ADVANCED PAIN RELIEF) 16-11 % CREA Apply 1 application topically daily as needed (pain).     metoprolol succinate (TOPROL-XL) 100 MG 24 hr tablet Take 100 mg by mouth every morning.     pravastatin (PRAVACHOL) 80 MG tablet Take 80 mg by mouth at bedtime.     prochlorperazine (COMPAZINE) 10 MG tablet Take 1 tablet (10 mg total) by mouth every 6 (six) hours as needed for nausea or vomiting. 30 tablet 0   tamsulosin (FLOMAX) 0.4 MG CAPS capsule Take 0.4 mg by mouth daily.     No facility-administered medications prior to visit.    Review of Systems  Constitutional:  Negative for chills, fever, malaise/fatigue and weight loss.  HENT:  Negative for hearing loss, sore throat and tinnitus.   Eyes:  Negative for blurred vision and double vision.  Respiratory:  Negative for cough, hemoptysis, sputum production, shortness of breath, wheezing and stridor.   Cardiovascular:  Negative for chest pain, palpitations, orthopnea, leg swelling and PND.  Gastrointestinal:  Negative for abdominal pain, constipation, diarrhea, heartburn, nausea and vomiting.  Genitourinary:  Negative for dysuria, hematuria and urgency.  Musculoskeletal:  Negative for joint pain and myalgias.  Skin:  Negative for itching and rash.  Neurological:  Negative for dizziness, tingling, weakness and headaches.  Endo/Heme/Allergies:  Negative for environmental allergies. Does not bruise/bleed easily.  Psychiatric/Behavioral:  Negative for depression. The patient is nervous/anxious. The patient does not have insomnia.   All other systems reviewed and are negative.   Objective:  Physical Exam Vitals reviewed.  Constitutional:      General: He is not in acute distress.    Appearance: He is well-developed. He is obese.  HENT:     Head: Normocephalic and atraumatic.  Eyes:     General: No scleral icterus.    Conjunctiva/sclera: Conjunctivae normal.     Pupils: Pupils are equal, round, and reactive to light.   Neck:     Vascular: No JVD.     Trachea: No tracheal deviation.  Cardiovascular:     Rate and Rhythm: Normal rate and regular rhythm.     Heart sounds: Normal heart sounds. No murmur heard. Pulmonary:     Effort: Pulmonary effort is normal. No tachypnea, accessory muscle usage or respiratory distress.     Breath sounds: Normal breath sounds. No stridor. No wheezing, rhonchi or rales.  Abdominal:     General: Bowel sounds are normal. There is no distension.     Palpations: Abdomen is soft.     Tenderness: There is no abdominal tenderness.  Musculoskeletal:        General: No tenderness.     Cervical back: Neck supple.  Lymphadenopathy:     Cervical: No cervical adenopathy.  Skin:    General: Skin is warm and dry.     Capillary Refill:  Capillary refill takes less than 2 seconds.     Findings: No rash.  Neurological:     Mental Status: He is alert and oriented to person, place, and time.  Psychiatric:        Behavior: Behavior normal.     Vitals:   01/08/21 1104  BP: (!) 152/78  Pulse: 78  SpO2: 99%  Weight: 261 lb (118.4 kg)  Height: 5\' 11"  (1.803 m)   99% on RA BMI Readings from Last 3 Encounters:  01/08/21 36.40 kg/m  01/02/21 36.46 kg/m  12/20/20 38.35 kg/m   Wt Readings from Last 3 Encounters:  01/08/21 261 lb (118.4 kg)  01/02/21 261 lb 6.4 oz (118.6 kg)  12/20/20 275 lb (124.7 kg)     CBC    Component Value Date/Time   WBC 3.0 (L) 01/02/2021 0810   WBC 5.1 12/20/2020 0554   RBC 4.34 01/02/2021 0810   HGB 13.3 01/02/2021 0810   HCT 38.6 (L) 01/02/2021 0810   PLT 120 (L) 01/02/2021 0810   MCV 88.9 01/02/2021 0810   MCH 30.6 01/02/2021 0810   MCHC 34.5 01/02/2021 0810   RDW 12.7 01/02/2021 0810   LYMPHSABS 0.9 01/02/2021 0810   MONOABS 0.1 01/02/2021 0810   EOSABS 0.1 01/02/2021 0810   BASOSABS 0.1 01/02/2021 0810     Chest Imaging: June 2022 CT chest: 2.3 x 1.6 spiculated left upper lobe nodule concerning for primary bronchogenic  carcinoma. The patient's images have been independently reviewed by me.    Pulmonary Functions Testing Results: PFT Results Latest Ref Rng & Units 12/09/2020  FVC-Pre L 3.65  FVC-Predicted Pre % 79  FVC-Post L 3.93  FVC-Predicted Post % 85  Pre FEV1/FVC % % 71  Post FEV1/FCV % % 70  FEV1-Pre L 2.60  FEV1-Predicted Pre % 76  FEV1-Post L 2.75  DLCO uncorrected ml/min/mmHg 25.72  DLCO UNC% % 96  DLCO corrected ml/min/mmHg 30.14  DLCO COR %Predicted % 113  DLVA Predicted % 108  TLC L 6.15  TLC % Predicted % 86  RV % Predicted % 90    FeNO:   Pathology:   Echocardiogram:   Heart Catheterization:     Assessment & Plan:   No diagnosis found.   Discussion: This is a 70 year old gentleman, former smoker quit 1992.  Recent diagnosis of muscle invasive bladder cancer by urology.  He had small cell features and he has been referred to medical oncology.  He establishes care with Dr. Alen Blew this Friday.  We reviewed patient's CT imaging today with a left upper lobe pulmonary nodule with concerning features for potential underlying malignancy.  Based on location and features I suspect we are dealing with an additional primary lung cancer in conjunction with his bladder cancer however this cannot be excluded until we obtain biopsy.  Plan: Discussed risk benefits and alternatives of proceeding with navigational bronchoscopy, tissue sampling as well as possible fiducial placement. We discussed the risk of bleeding and pneumothorax. Patient is agreeable to proceed next available time slot. Patient has a nuclear medicine PET scan that has been ordered.  We need to work to get this scheduled. I would like to have a nuclear medicine pet image scheduled before the bronchoscopy if possible.  However would not want to delay. Patient will need a noncontrasted CT scan of the chest completed for super D formatting.  We discussed all of this today in the office.  He has PFTs scheduled for this  afternoon.     Current  Outpatient Medications:    amLODipine (NORVASC) 5 MG tablet, Take 5 mg by mouth every morning., Disp: , Rfl:    diclofenac Sodium (VOLTAREN) 1 % GEL, Apply 1 application topically daily as needed (knee pain)., Disp: , Rfl:    lidocaine-prilocaine (EMLA) cream, Apply 1 application topically as needed., Disp: 30 g, Rfl: 0   lisinopril (ZESTRIL) 40 MG tablet, Take 40 mg by mouth every morning., Disp: , Rfl:    Menthol-Camphor (ICY HOT ADVANCED PAIN RELIEF) 16-11 % CREA, Apply 1 application topically daily as needed (pain)., Disp: , Rfl:    metoprolol succinate (TOPROL-XL) 100 MG 24 hr tablet, Take 100 mg by mouth every morning., Disp: , Rfl:    pravastatin (PRAVACHOL) 80 MG tablet, Take 80 mg by mouth at bedtime., Disp: , Rfl:    prochlorperazine (COMPAZINE) 10 MG tablet, Take 1 tablet (10 mg total) by mouth every 6 (six) hours as needed for nausea or vomiting., Disp: 30 tablet, Rfl: 0   tamsulosin (FLOMAX) 0.4 MG CAPS capsule, Take 0.4 mg by mouth daily., Disp: , Rfl:   I spent 62 minutes dedicated to the care of this patient on the date of this encounter to include pre-visit review of records, face-to-face time with the patient discussing conditions above, post visit ordering of testing, clinical documentation with the electronic health record, making appropriate referrals as documented, and communicating necessary findings to members of the patients care team.   Garner Nash, DO [facility] 01/08/2021 11:19 AM

## 2021-01-12 DIAGNOSIS — I10 Essential (primary) hypertension: Secondary | ICD-10-CM | POA: Diagnosis not present

## 2021-01-12 DIAGNOSIS — E7800 Pure hypercholesterolemia, unspecified: Secondary | ICD-10-CM | POA: Diagnosis not present

## 2021-01-12 DIAGNOSIS — D510 Vitamin B12 deficiency anemia due to intrinsic factor deficiency: Secondary | ICD-10-CM | POA: Diagnosis not present

## 2021-01-13 NOTE — Progress Notes (Signed)
[place], [place] [address]  DIAGNOSIS: 70 year old man with bladder cancer diagnosed in May 2022.  He was found to have high-grade urothelial carcinoma with small cell component and documented stage IV disease and hepatic metastasis.   Secondary diagnosis: Left lung neoplasm consistent with non-small cell lung cancer diagnosed in July 2022  PRIOR THERAPY: He underwent TURBT on November 13, 2020 and the final pathology revealed high-grade urothelial carcinoma with small cell component of 70%.  CURRENT THERAPY: Chemotherapy utilizing gemcitabine and cisplatin started on December 27, 2020.  He is here for day 1 of cycle 2 of therapy.  INTERVAL HISTORY: Javier Sellers 70 y.o. male returns to the clinic today for a follow-up visit.  The patient is feeling fairly well today without any concerning complaints. He is tolerating his treatment fairly well except for fatigue about 3 days following treatment and decreased appetite. His weight is stable compared to his last appointment.  He reports he sometimes gets night sweats. He also reports a temperature in the 99's in the evening for which he will take 2 tylenol and feeling cold and clammy after coming in outdoors from outside. He denies shortness of breath, recent sick contacts, sinus pressure, or skin infections. He had an episode of dysuria following his first cycle of treatment which has resolved. No costovertebral tenderness or dysuria at this time. He reports a low back spasm this morning but believes it may be due to activities from yesterday or how he slept last night. He denies any chest pain, shortness of breath, or hemoptysis. He has a mild cough associated with post-nasal drainage. He does not take any medications for allergies.   He denies any recent nausea, vomiting, diarrhea, constipation, abdominal pain. He took compazine before his first infusion and believes that it  made him sick, therefore, he has not taken it again.  He denies any bone pain.  He denies any worsening peripheral neuropathy. The patient recently had a follow-up appointment Dr. Valeta Harms for his recently diagnosed lung cancer who is planning on undergoing PFTs.  The patient is here today for evaluation and repeat blood work before starting the next cycle of treatment.   MEDICAL HISTORY: Past Medical History:  Diagnosis Date   Complication of anesthesia    vomiting only with ether   High cholesterol    Hypertension     ALLERGIES:  has No Known Allergies.  MEDICATIONS:  Current Outpatient Medications  Medication Sig Dispense Refill   amLODipine (NORVASC) 5 MG tablet Take 5 mg by mouth every morning.     diclofenac Sodium (VOLTAREN) 1 % GEL Apply 1 application topically daily as needed (knee pain).     lidocaine-prilocaine (EMLA) cream Apply 1 application topically as needed. 30 g 0   lisinopril (ZESTRIL) 40 MG tablet Take 40 mg by mouth every morning.     Menthol-Camphor (ICY HOT ADVANCED PAIN RELIEF) 16-11 % CREA Apply 1 application topically daily as needed (pain).     metoprolol succinate (TOPROL-XL) 100 MG 24 hr tablet Take 100 mg by mouth every morning.     pravastatin (PRAVACHOL) 80 MG tablet Take 80 mg by mouth at bedtime.     prochlorperazine (COMPAZINE) 10 MG tablet Take 1 tablet (10 mg total) by mouth every 6 (six) hours as needed for nausea or vomiting. 30 tablet 0   tamsulosin (FLOMAX) 0.4 MG CAPS capsule Take 0.4 mg by mouth daily.  No current facility-administered medications for this visit.   Facility-Administered Medications Ordered in Other Visits  Medication Dose Route Frequency Provider Last Rate Last Admin   0.9 %  sodium chloride infusion   Intravenous Once Shadad, Mathis Dad, MD       heparin lock flush 100 unit/mL  500 Units Intracatheter Once PRN Wyatt Portela, MD       magnesium sulfate IVPB 2 g 50 mL  2 g Intravenous Once Shadad, Firas N, MD 50 mL/hr at  01/16/21 1218 2 g at 01/16/21 1218   sodium chloride flush (NS) 0.9 % injection 10 mL  10 mL Intracatheter PRN Wyatt Portela, MD        SURGICAL HISTORY:  Past Surgical History:  Procedure Laterality Date   APPENDECTOMY     BRONCHIAL BIOPSY  12/20/2020   Procedure: BRONCHIAL BIOPSIES;  Surgeon: Garner Nash, DO;  Location: [facility];  Service: Pulmonary;;   BRONCHIAL BRUSHINGS  12/20/2020   Procedure: BRONCHIAL BRUSHINGS;  Surgeon: Garner Nash, DO;  Location: [facility];  Service: Pulmonary;;   BRONCHIAL NEEDLE ASPIRATION BIOPSY  12/20/2020   Procedure: BRONCHIAL NEEDLE ASPIRATION BIOPSIES;  Surgeon: Garner Nash, DO;  Location: [facility];  Service: Pulmonary;;   BRONCHIAL WASHINGS  12/20/2020   Procedure: BRONCHIAL WASHINGS;  Surgeon: Garner Nash, DO;  Location: [facility];  Service: Pulmonary;;   FIDUCIAL MARKER PLACEMENT  12/20/2020   Procedure: FIDUCIAL MARKER PLACEMENT;  Surgeon: Garner Nash, DO;  Location: [facility];  Service: Pulmonary;;   IR IMAGING GUIDED PORT INSERTION  12/26/2020   ROTATOR CUFF REPAIR Bilateral    VIDEO BRONCHOSCOPY WITH ENDOBRONCHIAL NAVIGATION Left 12/20/2020   Procedure: VIDEO BRONCHOSCOPY WITH ENDOBRONCHIAL NAVIGATION;  Surgeon: Garner Nash, DO;  Location: [facility];  Service: Pulmonary;  Laterality: Left;    REVIEW OF SYSTEMS:   Review of Systems  Constitutional: Positive for questionable chills. Positive for decreased appetite following treatment. Negative for chills, fever and unexpected weight change.  HENT: Positive for post-nasal drainage. Negative for mouth sores, nosebleeds, sore throat and trouble swallowing.   Eyes: Negative for eye problems and icterus.  Respiratory: Negative for cough (except associated with clearing throat from post nasal drainage), hemoptysis, shortness of breath and wheezing.   Cardiovascular: Negative for chest pain and leg swelling.  Gastrointestinal: Negative for abdominal pain,  constipation, diarrhea, nausea and vomiting.  Genitourinary: Negative for bladder incontinence, difficulty urinating, dysuria (resolved), frequency and hematuria.   Musculoskeletal: Negative for back pain, gait problem, neck pain and neck stiffness.  Skin: Negative for itching and rash.  Neurological: Negative for dizziness, extremity weakness, gait problem, headaches, light-headedness and seizures.  Hematological: Negative for adenopathy. Does not bruise/bleed easily.  Psychiatric/Behavioral: Negative for confusion, depression and sleep disturbance. The patient is not nervous/anxious.     PHYSICAL EXAMINATION:  Blood pressure (!) 170/73, pulse 69, temperature 97.7 F (36.5 C), temperature source Tympanic, resp. rate 18, height 5\' 11"  (1.803 m), weight 262 lb 14.4 oz (119.3 kg), SpO2 100 %.  ECOG PERFORMANCE STATUS: 1  Physical Exam  Constitutional: Oriented to person, place, and time and well-developed, well-nourished, and in no distress.  HENT:  Head: Normocephalic and atraumatic.  Mouth/Throat: Oropharynx is clear and moist. No oropharyngeal exudate.  Eyes: Conjunctivae are normal. Right eye exhibits no discharge. Left eye exhibits no discharge. No scleral icterus.  Neck: Normal range of motion. Neck supple.  Cardiovascular: Normal rate, regular rhythm, normal heart sounds and intact distal pulses.  Pulmonary/Chest: Effort normal and breath sounds normal. No respiratory distress. No wheezes. No rales.  Abdominal: Soft. Bowel sounds are normal. Exhibits no distension and no mass. There is no tenderness.  Musculoskeletal: Normal range of motion. Exhibits no edema. No CVA tenderness.  Lymphadenopathy:    No cervical adenopathy.  Neurological: Alert and oriented to person, place, and time. Exhibits normal muscle tone. Gait normal. Coordination normal.  Skin: Skin is warm and dry. No rash noted. Not diaphoretic. No erythema. No pallor.  Psychiatric: Mood, memory and judgment normal.   Vitals reviewed.  LABORATORY DATA: Lab Results  Component Value Date   WBC 3.2 (L) 01/16/2021   HGB 11.6 (L) 01/16/2021   HCT 32.9 (L) 01/16/2021   MCV 85.7 01/16/2021   PLT 286 01/16/2021      Chemistry      Component Value Date/Time   NA 140 01/16/2021 1038   K 3.8 01/16/2021 1038   CL 104 01/16/2021 1038   CO2 24 01/16/2021 1038   BUN 15 01/16/2021 1038   CREATININE 0.83 01/16/2021 1038      Component Value Date/Time   CALCIUM 9.3 01/16/2021 1038   ALKPHOS 80 01/16/2021 1038   AST 23 01/16/2021 1038   ALT 21 01/16/2021 1038   BILITOT 0.3 01/16/2021 1038       RADIOGRAPHIC STUDIES:  NM PET Image Initial (PI) Skull Base To Thigh  Result Date: 12/26/2020 CLINICAL DATA:  Subsequent treatment strategy for bladder cancer. EXAM: NUCLEAR MEDICINE PET SKULL BASE TO THIGH TECHNIQUE: 13.7 mCi F-18 FDG was injected intravenously. Full-ring PET imaging was performed from the skull base to thigh after the radiotracer. CT data was obtained and used for attenuation correction and anatomic localization. Fasting blood glucose: 149 mg/dl COMPARISON:  CT abdomen 10/28/2020 FINDINGS: Mediastinal blood pool activity: SUV max 0.9 Liver activity: SUV max NA NECK: No hypermetabolic lymph nodes in the neck. Incidental CT findings: none CHEST: LEFT upper lobe pulmonary nodule measuring 22 mm (image 63) and has intense metabolic activity with SUV max equal 6.5. There are fiducial markers adjacent to this lesion. Intense metabolic activity associated with a small LEFT hilar lymph node with SUV max equal 9.0. Adjacent node which is smaller SUV max equal 4.3. Incidental CT findings: none ABDOMEN/PELVIS: There multiple intense radiotracer avid lesions throughout the LEFT and RIGHT hepatic lobes. Lesions are not appreciated on the noncontrast CT or on comparison contrast CT 10/28/2020. Example lesion in the lateral segment of the LEFT hepatic lobe with SUV max 13.3. This lesion is approximately 2 cm. Similar  lesion in the posterior RIGHT hepatic lobe with SUV max equal 12.6. There approximately 30 lesions throughout the LEFT and RIGHT hepatic lobes No hypermetabolic abdominopelvic lymph nodes. There is asymmetric thickening of the RIGHT wall the bladder. Intense activity within the bladder related to radiotracer in the urine. No abnormal activity within prostate gland. Incidental CT findings: none SKELETON: Focal lesion in the RIGHT acetabulum with SUV max equal 9.2 (image 198). No obvious CT correlation. Second skeletal lesion within the medial aspect of the RIGHT first rib with SUV max equal 10.1. There is a lytic lesion associated with this activity measuring 22 mm on image 49. Incidental CT findings: none IMPRESSION: 1. Hypermetabolic LEFT upper lobe pulmonary nodule consistent bronchogenic carcinoma. 2. Two adjacent hypermetabolic LEFT hilar nodal metastasis. 3. Multifocal intensely hypermetabolic HEPATIC METASTASIS. 4. Two hypermetabolic skeletal metastasis. One in RIGHT first rib and a second in the RIGHT acetabulum. 5. Asymmetric bladder wall thickening on the  RIGHT. Electronically Signed   By: Suzy Bouchard M.D.   On: 12/26/2020 11:27   DG CHEST PORT 1 VIEW  Result Date: 12/20/2020 CLINICAL DATA:  Status post bronchoscopy of the left upper lobe. EXAM: PORTABLE CHEST 1 VIEW COMPARISON:  CT chest 12/18/2020 FINDINGS: Stable cardiomediastinal contours. No pleural effusion or edema. No pneumothorax identified status post bronchoscopy. There are 2 fiducial markers identified within the medial aspect of the left upper lobe corresponding to known pulmonary nodule. Hazy airspace opacification within the left upper lobe is identified likely reflecting post biopsy hemorrhage. Right lung appears clear. No pleural effusion or edema. IMPRESSION: 1. No pneumothorax status post bronchoscopy. 2. Hazy airspace opacification within the left upper lobe compatible with post biopsy hemorrhage. Electronically Signed   By:  Kerby Moors M.D.   On: 12/20/2020 09:12   IR IMAGING GUIDED PORT INSERTION  Result Date: 12/26/2020 INDICATION: 70 year old male with history of urothelial carcinoma. Request centrally venous access for chemotherapy. EXAM: IMPLANTED PORT A CATH PLACEMENT WITH ULTRASOUND AND FLUOROSCOPIC GUIDANCE MEDICATIONS: None. ANESTHESIA/SEDATION: Versed 2 mg IV; Fentanyl 100 mcg IV; Moderate Sedation Time:  25 minutes The patient was continuously monitored during the procedure by the interventional radiology nurse under my direct supervision. FLUOROSCOPY TIME:  0 minutes, 22 seconds (8.676 mGy) COMPLICATIONS: None immediate. PROCEDURE: The right neck and chest was prepped with chlorhexidine, and draped in the usual sterile fashion using maximum barrier technique (cap and mask, sterile gown, sterile gloves, large sterile sheet, hand hygiene and cutaneous antiseptic). Local anesthesia was attained by infiltration with 1% lidocaine with epinephrine. Ultrasound demonstrated patency of the right internal jugular vein, and this was documented with an image. Under real-time ultrasound guidance, this vein was accessed with a 21 gauge micropuncture needle and image documentation was performed. A small dermatotomy was made at the access site with an 11 scalpel. A 0.018" wire was advanced into the SVC and the access needle exchanged for a 68F micropuncture vascular sheath. The 0.018" wire was then removed and a 0.035" wire advanced into the IVC. An appropriate location for the subcutaneous reservoir was selected below the clavicle and an incision was made through the skin and underlying soft tissues. The subcutaneous tissues were then dissected using a combination of blunt and sharp surgical technique and a pocket was formed. A single lumen power injectable portacatheter was then tunneled through the subcutaneous tissues from the pocket to the dermatotomy and the port reservoir placed within the subcutaneous pocket. The venous  access site was then serially dilated and a peel away vascular sheath placed over the wire. The wire was removed and the port catheter advanced into position under fluoroscopic guidance. The catheter tip is positioned in the superior cavoatrial junction. This was documented with a spot image. The portacatheter was then tested and found to flush and aspirate well. The port was flushed with saline followed by 100 units/mL heparinized saline. The pocket was then closed with subdermal inverted interrupted absorbable sutures using 3-0 Vicryl. The epidermis was then sealed with Dermabond. The dermatotomy at the venous access site was also closed with Dermabond. IMPRESSION: Successful placement of a right IJ approach Power Port with ultrasound and fluoroscopic guidance. The catheter is ready for use. Electronically Signed   By: Michaelle Birks MD   On: 12/26/2020 16:50   CT Super D Chest Wo Contrast  Result Date: 12/19/2020 CLINICAL DATA:  Recent diagnosis bladder cancer, pulmonary nodule EXAM: CT CHEST WITHOUT CONTRAST TECHNIQUE: Multidetector CT imaging of the chest was  performed using thin slice collimation for electromagnetic bronchoscopy planning purposes, without intravenous contrast. COMPARISON:  11/26/2020 FINDINGS: Cardiovascular: Aortic atherosclerosis. Dense aortic valve calcifications. Normal heart size. Left and right coronary artery calcifications. No pericardial effusion. Mediastinum/Nodes: No enlarged mediastinal, hilar, or axillary lymph nodes. Thyroid gland, trachea, and esophagus demonstrate no significant findings. Lungs/Pleura: Spiculated nodule of the paramedian left pulmonary apex measuring 2.3 x 1.6 cm, unchanged compared to recent prior examination (series 5, image 40). No pleural effusion or pneumothorax. Upper Abdomen: No acute abnormality. Musculoskeletal: No chest wall mass or suspicious bone lesions identified. IMPRESSION: 1. Spiculated nodule of the paramedian left pulmonary apex measuring  2.3 x 1.6 cm, unchanged compared to recent prior examination. This remains morphologically most concerning for synchronous primary lung malignancy; bladder metastatic disease not favored. 2. Dense aortic valve calcifications. Correlate for echocardiographic evidence of aortic valve dysfunction. 3. Coronary artery disease. Aortic Atherosclerosis (ICD10-I70.0). Electronically Signed   By: Eddie Candle M.D.   On: 12/19/2020 14:57   DG C-ARM BRONCHOSCOPY  Result Date: 12/20/2020 C-ARM BRONCHOSCOPY: Fluoroscopy was utilized by the requesting physician.  No radiographic interpretation.     ASSESSMENT/PLAN:  70 year old man with:  1.  Stage IV bladder cancer and found to have high-grade urothelial carcinoma with muscle invasion and small cell component.  He has also documented hepatic Metastasis.   Per Dr. Alen Blew, systemic chemotherapy remains the main treatment choice at this time moving forward.  The duration of chemotherapy would be for 6 cycles given his advanced disease.  Dr. Alen Blew will update his staging scans after 3 cycles to ensure appropriate response.   After discussion today, he is agreeable to proceed. His total WBC count was slightly low at 3.2. His ANC was slightly low at 1.5. I reviewed with Dr. Julien Nordmann today who recommended keeping the dose of his cisplatin and gemzar the same. I discussed with the patient that we will need to monitor his labs closely. Discussed that if his ANC is low next week, Dr. Alen Blew may consider dose adjustments or other interventions if necessary but this will be based on his repeat labs next week. Of course, I advised the patient to call if he has a temperature of >100.4 and/or signs and symptoms of infection. Due to his subjective feelings of chills and dysuria a few weeks ago, I will go ahead and check a UA today to rule out infection. He denies CVA tenderness, flank pain, nausea, or vomiting. His temperature is 99.7. today.    2.  IV access: Port-A-Cath inserted  without any complications.   3.  Antiemetics: No nausea or vomiting reported at this time.  Compazine is available to him. The patient belives compazine does not work for him and makes him sick. I offered a prescription for zofran to use 3 days after chemo every 8 hours PRN for nausea but he declined.    4.  Renal function surveillance: will continue to monitor on cisplatin therapy.  Baseline kidney function is normal.   5.  Goals of care: His disease appears to be incurable although aggressive measures are warranted at this time.   6.  Lung neoplasm: Per Dr. Hazeline Junker last note, given his metastatic disease, definitive therapy for his lung nodule is likely unnecessary and systemic chemotherapy should address his disease.   7.  Follow-up: In 3 weeks for the start of cycle 3 of therapy.  The patient was not scheduled for the correct duration in infusion today. Reviewed with Dr. Julien Nordmann. In the absence of  any cardiac history and that the patient is feeling well today, it is ok to run his fluids with cisplatin to ensure he gets his treatment as scheduled.    Orders Placed This Encounter  Procedures   Urine Culture    Standing Status:   Future    Standing Expiration Date:   01/16/2022   Urinalysis, Complete w Microscopic    Standing Status:   Future    Standing Expiration Date:   01/16/2022     The total time spent in the appointment was 30-39 minutes.   Keeanna Villafranca L Eladia Frame, PA-C 01/16/21

## 2021-01-15 ENCOUNTER — Encounter: Payer: Self-pay | Admitting: Oncology

## 2021-01-15 NOTE — Progress Notes (Signed)
Called pt to introduce myself as his Arboriculturist.  Pt has 2 insurance so copay assistance shouldn't be needed.  I informed him of the [employer], went over what it covers and gave him the income requirement.  Pt would like to apply so he will bring his proof of income on 01/16/21.  If approved I will give him an expense sheet and my card for any questions or concerns he may have in the future.

## 2021-01-16 ENCOUNTER — Inpatient Hospital Stay: Payer: Medicare Other

## 2021-01-16 ENCOUNTER — Ambulatory Visit: Payer: Medicare Other

## 2021-01-16 ENCOUNTER — Other Ambulatory Visit: Payer: Self-pay | Admitting: Physician Assistant

## 2021-01-16 ENCOUNTER — Inpatient Hospital Stay (HOSPITAL_BASED_OUTPATIENT_CLINIC_OR_DEPARTMENT_OTHER): Payer: Medicare Other | Admitting: Physician Assistant

## 2021-01-16 ENCOUNTER — Other Ambulatory Visit: Payer: Self-pay

## 2021-01-16 ENCOUNTER — Other Ambulatory Visit: Payer: Self-pay | Admitting: *Deleted

## 2021-01-16 ENCOUNTER — Inpatient Hospital Stay: Payer: Medicare Other | Attending: Physician Assistant

## 2021-01-16 ENCOUNTER — Encounter: Payer: Self-pay | Admitting: Oncology

## 2021-01-16 ENCOUNTER — Encounter: Payer: Self-pay | Admitting: Physician Assistant

## 2021-01-16 VITALS — BP 170/73 | HR 69 | Temp 97.7°F | Resp 18 | Ht 71.0 in | Wt 262.9 lb

## 2021-01-16 DIAGNOSIS — C679 Malignant neoplasm of bladder, unspecified: Secondary | ICD-10-CM | POA: Insufficient documentation

## 2021-01-16 DIAGNOSIS — Z79899 Other long term (current) drug therapy: Secondary | ICD-10-CM | POA: Insufficient documentation

## 2021-01-16 DIAGNOSIS — C3492 Malignant neoplasm of unspecified part of left bronchus or lung: Secondary | ICD-10-CM | POA: Diagnosis not present

## 2021-01-16 DIAGNOSIS — C787 Secondary malignant neoplasm of liver and intrahepatic bile duct: Secondary | ICD-10-CM | POA: Insufficient documentation

## 2021-01-16 DIAGNOSIS — Z95828 Presence of other vascular implants and grafts: Secondary | ICD-10-CM

## 2021-01-16 DIAGNOSIS — Z5111 Encounter for antineoplastic chemotherapy: Secondary | ICD-10-CM | POA: Diagnosis not present

## 2021-01-16 DIAGNOSIS — R300 Dysuria: Secondary | ICD-10-CM

## 2021-01-16 LAB — CMP (CANCER CENTER ONLY)
ALT: 21 U/L (ref 0–44)
AST: 23 U/L (ref 15–41)
Albumin: 3.7 g/dL (ref 3.5–5.0)
Alkaline Phosphatase: 80 U/L (ref 38–126)
Anion gap: 12 (ref 5–15)
BUN: 15 mg/dL (ref 8–23)
CO2: 24 mmol/L (ref 22–32)
Calcium: 9.3 mg/dL (ref 8.9–10.3)
Chloride: 104 mmol/L (ref 98–111)
Creatinine: 0.83 mg/dL (ref 0.61–1.24)
GFR, Estimated: >60 mL/min (ref >60)
Glucose, Bld: 158 mg/dL — ABNORMAL HIGH (ref 70–99)
Potassium: 3.8 mmol/L (ref 3.5–5.1)
Sodium: 140 mmol/L (ref 135–145)
Total Bilirubin: 0.3 mg/dL (ref 0.3–1.2)
Total Protein: 7.4 g/dL (ref 6.5–8.1)

## 2021-01-16 LAB — MAGNESIUM: Magnesium: 1.8 mg/dL (ref 1.7–2.4)

## 2021-01-16 LAB — URINALYSIS, COMPLETE (UACMP) WITH MICROSCOPIC
Bacteria, UA: NONE SEEN
Bilirubin Urine: NEGATIVE
Glucose, UA: NEGATIVE mg/dL
Hgb urine dipstick: NEGATIVE
Ketones, ur: NEGATIVE mg/dL
Leukocytes,Ua: NEGATIVE
Nitrite: NEGATIVE
Protein, ur: NEGATIVE mg/dL
Specific Gravity, Urine: 1.013 (ref 1.005–1.030)
pH: 5 (ref 5.0–8.0)

## 2021-01-16 LAB — CBC WITH DIFFERENTIAL (CANCER CENTER ONLY)
Abs Immature Granulocytes: 0.04 10*3/uL (ref 0.00–0.07)
Basophils Absolute: 0 10*3/uL (ref 0.0–0.1)
Basophils Relative: 1 %
Eosinophils Absolute: 0.1 10*3/uL (ref 0.0–0.5)
Eosinophils Relative: 3 %
HCT: 32.9 % — ABNORMAL LOW (ref 39.0–52.0)
Hemoglobin: 11.6 g/dL — ABNORMAL LOW (ref 13.0–17.0)
Immature Granulocytes: 1 %
Lymphocytes Relative: 36 %
Lymphs Abs: 1.1 10*3/uL (ref 0.7–4.0)
MCH: 30.2 pg (ref 26.0–34.0)
MCHC: 35.3 g/dL (ref 30.0–36.0)
MCV: 85.7 fL (ref 80.0–100.0)
Monocytes Absolute: 0.4 10*3/uL (ref 0.1–1.0)
Monocytes Relative: 12 %
Neutro Abs: 1.5 10*3/uL — ABNORMAL LOW (ref 1.7–7.7)
Neutrophils Relative %: 47 %
Platelet Count: 286 10*3/uL (ref 150–400)
RBC: 3.84 MIL/uL — ABNORMAL LOW (ref 4.22–5.81)
RDW: 13.2 % (ref 11.5–15.5)
WBC Count: 3.2 10*3/uL — ABNORMAL LOW (ref 4.0–10.5)
nRBC: 0 % (ref 0.0–0.2)

## 2021-01-16 MED ORDER — SODIUM CHLORIDE 0.9% FLUSH
10.0000 mL | INTRAVENOUS | Status: DC | PRN
  Administered 2021-01-16: 10 mL
  Filled 2021-01-16: qty 10

## 2021-01-16 MED ORDER — MAGNESIUM SULFATE 2 GM/50ML IV SOLN
INTRAVENOUS | Status: AC
  Filled 2021-01-16: qty 50

## 2021-01-16 MED ORDER — SODIUM CHLORIDE 0.9 % IV SOLN
1000.0000 mg/m2 | Freq: Once | INTRAVENOUS | Status: AC
  Administered 2021-01-16: 2508 mg via INTRAVENOUS
  Filled 2021-01-16: qty 65.96

## 2021-01-16 MED ORDER — HEPARIN SOD (PORK) LOCK FLUSH 100 UNIT/ML IV SOLN
500.0000 [IU] | Freq: Once | INTRAVENOUS | Status: AC | PRN
  Administered 2021-01-16: 500 [IU]
  Filled 2021-01-16: qty 5

## 2021-01-16 MED ORDER — SODIUM CHLORIDE 0.9 % IV SOLN
150.0000 mg | Freq: Once | INTRAVENOUS | Status: AC
  Administered 2021-01-16: 150 mg via INTRAVENOUS
  Filled 2021-01-16: qty 150

## 2021-01-16 MED ORDER — SODIUM CHLORIDE 0.9% FLUSH
10.0000 mL | Freq: Once | INTRAVENOUS | Status: AC
  Administered 2021-01-16: 10 mL
  Filled 2021-01-16: qty 10

## 2021-01-16 MED ORDER — MAGNESIUM SULFATE 2 GM/50ML IV SOLN
2.0000 g | Freq: Once | INTRAVENOUS | Status: AC
  Administered 2021-01-16: 2 g via INTRAVENOUS

## 2021-01-16 MED ORDER — SODIUM CHLORIDE 0.9 % IV SOLN
Freq: Once | INTRAVENOUS | Status: AC
  Filled 2021-01-16: qty 250

## 2021-01-16 MED ORDER — POTASSIUM CHLORIDE IN NACL 20-0.9 MEQ/L-% IV SOLN
Freq: Once | INTRAVENOUS | Status: AC
  Filled 2021-01-16: qty 1000

## 2021-01-16 MED ORDER — PALONOSETRON HCL INJECTION 0.25 MG/5ML
0.2500 mg | Freq: Once | INTRAVENOUS | Status: AC
  Administered 2021-01-16: 0.25 mg via INTRAVENOUS

## 2021-01-16 MED ORDER — SODIUM CHLORIDE 0.9 % IV SOLN
10.0000 mg | Freq: Once | INTRAVENOUS | Status: AC
  Administered 2021-01-16: 10 mg via INTRAVENOUS
  Filled 2021-01-16: qty 10

## 2021-01-16 MED ORDER — SODIUM CHLORIDE 0.9 % IV SOLN
Freq: Once | INTRAVENOUS | Status: DC
  Filled 2021-01-16: qty 250

## 2021-01-16 MED ORDER — PALONOSETRON HCL INJECTION 0.25 MG/5ML
INTRAVENOUS | Status: AC
  Filled 2021-01-16: qty 5

## 2021-01-16 MED ORDER — SODIUM CHLORIDE 0.9 % IV SOLN
70.0000 mg/m2 | Freq: Once | INTRAVENOUS | Status: AC
  Administered 2021-01-16: 174 mg via INTRAVENOUS
  Filled 2021-01-16: qty 174

## 2021-01-16 NOTE — Progress Notes (Signed)
ANC 1.5  PER Cassandra, NP " Ok to treat with ANC 1.5. I spoke to Dr. Julien Nordmann and he recommended keeping his doses the same. "  Per Cassandra, "Dr. Julien Nordmann said ok to speed up the fluids if needed and run with cisplatin if he is still getting it here today."

## 2021-01-16 NOTE — Progress Notes (Signed)
ma

## 2021-01-16 NOTE — Patient Instructions (Signed)
[facility]COL[facility]  Discharge Instructions: Thank you for choosing [facility] to provide your oncology and hematology care.   If you have a lab appointment with the [facility], please go directly to the [facility] and check in at the registration area.   Wear comfortable clothing and clothing appropriate for easy access to any Portacath or PICC line.   We strive to give you quality time with your provider. You may need to reschedule your appointment if you arrive late (15 or more minutes).  Arriving late affects you and other patients whose appointments are after yours.  Also, if you miss three or more appointments without notifying the office, you may be dismissed from the clinic at the provider's discretion.      For prescription refill requests, have your pharmacy contact our office and allow 72 hours for refills to be completed.    Today you received the following chemotherapy and/or immunotherapy agents Gemzar and Cisplatin       To help prevent nausea and vomiting after your treatment, we encourage you to take your nausea medication as directed.  BELOW ARE SYMPTOMS THAT SHOULD BE REPORTED IMMEDIATELY: *FEVER GREATER THAN 100.4 F (38 C) OR HIGHER *CHILLS OR SWEATING *NAUSEA AND VOMITING THAT IS NOT CONTROLLED WITH YOUR NAUSEA MEDICATION *UNUSUAL SHORTNESS OF BREATH *UNUSUAL BRUISING OR BLEEDING *URINARY PROBLEMS (pain or burning when urinating, or frequent urination) *BOWEL PROBLEMS (unusual diarrhea, constipation, pain near the anus) TENDERNESS IN MOUTH AND THROAT WITH OR WITHOUT PRESENCE OF ULCERS (sore throat, sores in mouth, or a toothache) UNUSUAL RASH, SWELLING OR PAIN  UNUSUAL VAGINAL DISCHARGE OR ITCHING   Items with * indicate a potential emergency and should be followed up as soon as possible or go to the Emergency Department if any problems should occur.  Please show the CHEMOTHERAPY ALERT CARD or IMMUNOTHERAPY ALERT CARD at  check-in to the Emergency Department and triage nurse.  Should you have questions after your visit or need to cancel or reschedule your appointment, please contact [facility]  Dept: [phone]  and follow the prompts.  Office hours are 8:00 a.m. to 4:30 p.m. Monday - Friday. Please note that voicemails left after 4:00 p.m. may not be returned until the following business day.  We are closed weekends and major holidays. You have access to a nurse at all times for urgent questions. Please call the main number to the clinic Dept: [phone] and follow the prompts.   For any non-urgent questions, you may also contact your provider using MyChart. We now offer e-Visits for anyone 42 and older to request care online for non-urgent symptoms. For details visit [email or web address].   Also download the MyChart app! Go to the app store, search "MyChart", open the app, select [facility], and log in with your MyChart username and password.  Due to Covid, a mask is required upon entering the hospital/clinic. If you do not have a mask, one will be given to you upon arrival. For doctor visits, patients may have 1 support person aged 64 or older with them. For treatment visits, patients cannot have anyone with them due to current Covid guidelines and our immunocompromised population.

## 2021-01-16 NOTE — Progress Notes (Signed)
Pt is approved for the $1000 [employer] grant.  

## 2021-01-18 LAB — URINE CULTURE: Culture: NO GROWTH

## 2021-01-23 ENCOUNTER — Other Ambulatory Visit: Payer: Self-pay

## 2021-01-23 ENCOUNTER — Inpatient Hospital Stay: Payer: Medicare Other

## 2021-01-23 VITALS — BP 175/80 | HR 74 | Temp 99.1°F | Resp 17 | Wt 256.5 lb

## 2021-01-23 DIAGNOSIS — C679 Malignant neoplasm of bladder, unspecified: Secondary | ICD-10-CM | POA: Diagnosis not present

## 2021-01-23 DIAGNOSIS — Z79899 Other long term (current) drug therapy: Secondary | ICD-10-CM | POA: Diagnosis not present

## 2021-01-23 DIAGNOSIS — C3492 Malignant neoplasm of unspecified part of left bronchus or lung: Secondary | ICD-10-CM | POA: Diagnosis not present

## 2021-01-23 DIAGNOSIS — Z95828 Presence of other vascular implants and grafts: Secondary | ICD-10-CM

## 2021-01-23 DIAGNOSIS — C787 Secondary malignant neoplasm of liver and intrahepatic bile duct: Secondary | ICD-10-CM | POA: Diagnosis not present

## 2021-01-23 DIAGNOSIS — Z5111 Encounter for antineoplastic chemotherapy: Secondary | ICD-10-CM | POA: Diagnosis not present

## 2021-01-23 LAB — CBC WITH DIFFERENTIAL (CANCER CENTER ONLY)
Abs Immature Granulocytes: 0.05 K/uL (ref 0.00–0.07)
Basophils Absolute: 0.1 K/uL (ref 0.0–0.1)
Basophils Relative: 1 %
Eosinophils Absolute: 0 K/uL (ref 0.0–0.5)
Eosinophils Relative: 0 %
HCT: 34.8 % — ABNORMAL LOW (ref 39.0–52.0)
Hemoglobin: 12.2 g/dL — ABNORMAL LOW (ref 13.0–17.0)
Immature Granulocytes: 1 %
Lymphocytes Relative: 25 %
Lymphs Abs: 1.2 K/uL (ref 0.7–4.0)
MCH: 29.9 pg (ref 26.0–34.0)
MCHC: 35.1 g/dL (ref 30.0–36.0)
MCV: 85.3 fL (ref 80.0–100.0)
Monocytes Absolute: 0.3 K/uL (ref 0.1–1.0)
Monocytes Relative: 7 %
Neutro Abs: 3.2 K/uL (ref 1.7–7.7)
Neutrophils Relative %: 66 %
Platelet Count: 207 K/uL (ref 150–400)
RBC: 4.08 MIL/uL — ABNORMAL LOW (ref 4.22–5.81)
RDW: 13 % (ref 11.5–15.5)
WBC Count: 4.8 K/uL (ref 4.0–10.5)
nRBC: 0 % (ref 0.0–0.2)

## 2021-01-23 LAB — CMP (CANCER CENTER ONLY)
ALT: 77 U/L — ABNORMAL HIGH (ref 0–44)
AST: 48 U/L — ABNORMAL HIGH (ref 15–41)
Albumin: 3.9 g/dL (ref 3.5–5.0)
Alkaline Phosphatase: 83 U/L (ref 38–126)
Anion gap: 10 (ref 5–15)
BUN: 15 mg/dL (ref 8–23)
CO2: 24 mmol/L (ref 22–32)
Calcium: 9.4 mg/dL (ref 8.9–10.3)
Chloride: 103 mmol/L (ref 98–111)
Creatinine: 0.81 mg/dL (ref 0.61–1.24)
GFR, Estimated: >60 mL/min (ref >60)
Glucose, Bld: 140 mg/dL — ABNORMAL HIGH (ref 70–99)
Potassium: 3.8 mmol/L (ref 3.5–5.1)
Sodium: 137 mmol/L (ref 135–145)
Total Bilirubin: 0.3 mg/dL (ref 0.3–1.2)
Total Protein: 7.4 g/dL (ref 6.5–8.1)

## 2021-01-23 LAB — MAGNESIUM: Magnesium: 1.7 mg/dL (ref 1.7–2.4)

## 2021-01-23 MED ORDER — HEPARIN SOD (PORK) LOCK FLUSH 100 UNIT/ML IV SOLN
500.0000 [IU] | Freq: Once | INTRAVENOUS | Status: AC | PRN
  Administered 2021-01-23: 500 [IU]
  Filled 2021-01-23: qty 5

## 2021-01-23 MED ORDER — SODIUM CHLORIDE 0.9 % IV SOLN
Freq: Once | INTRAVENOUS | Status: AC
  Filled 2021-01-23: qty 250

## 2021-01-23 MED ORDER — SODIUM CHLORIDE 0.9 % IV SOLN
1000.0000 mg/m2 | Freq: Once | INTRAVENOUS | Status: AC
  Administered 2021-01-23: 2508 mg via INTRAVENOUS
  Filled 2021-01-23: qty 65.96

## 2021-01-23 MED ORDER — SODIUM CHLORIDE 0.9% FLUSH
10.0000 mL | Freq: Once | INTRAVENOUS | Status: AC
  Administered 2021-01-23: 10 mL
  Filled 2021-01-23: qty 10

## 2021-01-23 MED ORDER — SODIUM CHLORIDE 0.9% FLUSH
10.0000 mL | INTRAVENOUS | Status: DC | PRN
Start: 2021-01-23 — End: 2021-01-23
  Administered 2021-01-23: 10 mL
  Filled 2021-01-23: qty 10

## 2021-01-23 MED ORDER — PROCHLORPERAZINE MALEATE 10 MG PO TABS
10.0000 mg | ORAL_TABLET | Freq: Once | ORAL | Status: AC
  Administered 2021-01-23: 10 mg via ORAL
  Filled 2021-01-23: qty 1

## 2021-01-23 NOTE — Patient Instructions (Addendum)
[facility]  Discharge Instructions: Thank you for choosing [facility] to provide your oncology and hematology care.   If you have a lab appointment with the [facility], please go directly to the [facility] and check in at the registration area.   Wear comfortable clothing and clothing appropriate for easy access to any Portacath or PICC line.   We strive to give you quality time with your provider. You may need to reschedule your appointment if you arrive late (15 or more minutes).  Arriving late affects you and other patients whose appointments are after yours.  Also, if you miss three or more appointments without notifying the office, you may be dismissed from the clinic at the provider's discretion.      For prescription refill requests, have your pharmacy contact our office and allow 72 hours for refills to be completed.    Today you received the following chemotherapy and/or immunotherapy agents: Gemcitabine    To help prevent nausea and vomiting after your treatment, we encourage you to take your nausea medication as directed.  BELOW ARE SYMPTOMS THAT SHOULD BE REPORTED IMMEDIATELY: *FEVER GREATER THAN 100.4 F (38 C) OR HIGHER *CHILLS OR SWEATING *NAUSEA AND VOMITING THAT IS NOT CONTROLLED WITH YOUR NAUSEA MEDICATION *UNUSUAL SHORTNESS OF BREATH *UNUSUAL BRUISING OR BLEEDING *URINARY PROBLEMS (pain or burning when urinating, or frequent urination) *BOWEL PROBLEMS (unusual diarrhea, constipation, pain near the anus) TENDERNESS IN MOUTH AND THROAT WITH OR WITHOUT PRESENCE OF ULCERS (sore throat, sores in mouth, or a toothache) UNUSUAL RASH, SWELLING OR PAIN  UNUSUAL VAGINAL DISCHARGE OR ITCHING   Items with * indicate a potential emergency and should be followed up as soon as possible or go to the Emergency Department if any problems should occur.  Please show the CHEMOTHERAPY ALERT CARD or IMMUNOTHERAPY ALERT CARD at check-in to  the Emergency Department and triage nurse.  Should you have questions after your visit or need to cancel or reschedule your appointment, please contact [facility]  Dept: [phone]  and follow the prompts.  Office hours are 8:00 a.m. to 4:30 p.m. Monday - Friday. Please note that voicemails left after 4:00 p.m. may not be returned until the following business day.  We are closed weekends and major holidays. You have access to a nurse at all times for urgent questions. Please call the main number to the clinic Dept: [phone] and follow the prompts.   For any non-urgent questions, you may also contact your provider using MyChart. We now offer e-Visits for anyone 60 and older to request care online for non-urgent symptoms. For details visit [email or web address].   Also download the MyChart app! Go to the app store, search "MyChart", open the app, select [facility], and log in with your MyChart username and password.  Due to Covid, a mask is required upon entering the hospital/clinic. If you do not have a mask, one will be given to you upon arrival. For doctor visits, patients may have 1 support person aged 61 or older with them. For treatment visits, patients cannot have anyone with them due to current Covid guidelines and our immunocompromised population.

## 2021-02-04 ENCOUNTER — Telehealth: Payer: Self-pay | Admitting: Oncology

## 2021-02-04 NOTE — Telephone Encounter (Signed)
R/s appt times per [facility]. Called pt, no answer. Left msg with updated appts times.

## 2021-02-06 ENCOUNTER — Inpatient Hospital Stay: Payer: Medicare Other

## 2021-02-06 ENCOUNTER — Other Ambulatory Visit: Payer: Self-pay

## 2021-02-06 ENCOUNTER — Inpatient Hospital Stay (HOSPITAL_BASED_OUTPATIENT_CLINIC_OR_DEPARTMENT_OTHER): Payer: Medicare Other | Admitting: Oncology

## 2021-02-06 VITALS — BP 178/92 | HR 72 | Temp 98.1°F | Resp 20 | Ht 71.0 in | Wt 261.2 lb

## 2021-02-06 VITALS — BP 175/87 | HR 78

## 2021-02-06 DIAGNOSIS — C787 Secondary malignant neoplasm of liver and intrahepatic bile duct: Secondary | ICD-10-CM | POA: Diagnosis not present

## 2021-02-06 DIAGNOSIS — C3490 Malignant neoplasm of unspecified part of unspecified bronchus or lung: Secondary | ICD-10-CM

## 2021-02-06 DIAGNOSIS — Z95828 Presence of other vascular implants and grafts: Secondary | ICD-10-CM

## 2021-02-06 DIAGNOSIS — Z5111 Encounter for antineoplastic chemotherapy: Secondary | ICD-10-CM | POA: Diagnosis not present

## 2021-02-06 DIAGNOSIS — C3492 Malignant neoplasm of unspecified part of left bronchus or lung: Secondary | ICD-10-CM | POA: Diagnosis not present

## 2021-02-06 DIAGNOSIS — C679 Malignant neoplasm of bladder, unspecified: Secondary | ICD-10-CM

## 2021-02-06 DIAGNOSIS — Z79899 Other long term (current) drug therapy: Secondary | ICD-10-CM | POA: Diagnosis not present

## 2021-02-06 LAB — CBC WITH DIFFERENTIAL (CANCER CENTER ONLY)
Abs Immature Granulocytes: 0.01 10*3/uL (ref 0.00–0.07)
Basophils Absolute: 0 10*3/uL (ref 0.0–0.1)
Basophils Relative: 1 %
Eosinophils Absolute: 0.1 10*3/uL (ref 0.0–0.5)
Eosinophils Relative: 4 %
HCT: 31.3 % — ABNORMAL LOW (ref 39.0–52.0)
Hemoglobin: 10.4 g/dL — ABNORMAL LOW (ref 13.0–17.0)
Immature Granulocytes: 0 %
Lymphocytes Relative: 29 %
Lymphs Abs: 0.8 10*3/uL (ref 0.7–4.0)
MCH: 29.1 pg (ref 26.0–34.0)
MCHC: 33.2 g/dL (ref 30.0–36.0)
MCV: 87.4 fL (ref 80.0–100.0)
Monocytes Absolute: 0.4 10*3/uL (ref 0.1–1.0)
Monocytes Relative: 15 %
Neutro Abs: 1.4 10*3/uL — ABNORMAL LOW (ref 1.7–7.7)
Neutrophils Relative %: 51 %
Platelet Count: 122 10*3/uL — ABNORMAL LOW (ref 150–400)
RBC: 3.58 MIL/uL — ABNORMAL LOW (ref 4.22–5.81)
RDW: 15.4 % (ref 11.5–15.5)
WBC Count: 2.7 10*3/uL — ABNORMAL LOW (ref 4.0–10.5)
nRBC: 0 % (ref 0.0–0.2)

## 2021-02-06 LAB — CMP (CANCER CENTER ONLY)
ALT: 17 U/L (ref 0–44)
AST: 15 U/L (ref 15–41)
Albumin: 3.7 g/dL (ref 3.5–5.0)
Alkaline Phosphatase: 77 U/L (ref 38–126)
Anion gap: 9 (ref 5–15)
BUN: 6 mg/dL — ABNORMAL LOW (ref 8–23)
CO2: 25 mmol/L (ref 22–32)
Calcium: 9.2 mg/dL (ref 8.9–10.3)
Chloride: 104 mmol/L (ref 98–111)
Creatinine: 0.84 mg/dL (ref 0.61–1.24)
GFR, Estimated: >60 mL/min (ref >60)
Glucose, Bld: 242 mg/dL — ABNORMAL HIGH (ref 70–99)
Potassium: 3.6 mmol/L (ref 3.5–5.1)
Sodium: 138 mmol/L (ref 135–145)
Total Bilirubin: 0.3 mg/dL (ref 0.3–1.2)
Total Protein: 6.8 g/dL (ref 6.5–8.1)

## 2021-02-06 LAB — MAGNESIUM: Magnesium: 1.6 mg/dL — ABNORMAL LOW (ref 1.7–2.4)

## 2021-02-06 MED ORDER — SODIUM CHLORIDE 0.9 % IV SOLN
10.0000 mg | Freq: Once | INTRAVENOUS | Status: AC
  Administered 2021-02-06: 10 mg via INTRAVENOUS
  Filled 2021-02-06: qty 10

## 2021-02-06 MED ORDER — SODIUM CHLORIDE 0.9 % IV SOLN
1000.0000 mg/m2 | Freq: Once | INTRAVENOUS | Status: AC
  Administered 2021-02-06: 2508 mg via INTRAVENOUS
  Filled 2021-02-06: qty 65.96

## 2021-02-06 MED ORDER — PALONOSETRON HCL INJECTION 0.25 MG/5ML
0.2500 mg | Freq: Once | INTRAVENOUS | Status: AC
  Administered 2021-02-06: 0.25 mg via INTRAVENOUS

## 2021-02-06 MED ORDER — SODIUM CHLORIDE 0.9 % IV SOLN: Freq: Once | INTRAVENOUS | Status: AC

## 2021-02-06 MED ORDER — MAGNESIUM SULFATE 2 GM/50ML IV SOLN
2.0000 g | Freq: Once | INTRAVENOUS | Status: AC
  Administered 2021-02-06: 2 g via INTRAVENOUS

## 2021-02-06 MED ORDER — SODIUM CHLORIDE 0.9% FLUSH
10.0000 mL | Freq: Once | INTRAVENOUS | Status: AC
Start: 2021-02-06 — End: 2021-02-06
  Administered 2021-02-06: 10 mL

## 2021-02-06 MED ORDER — MAGNESIUM SULFATE 2 GM/50ML IV SOLN
INTRAVENOUS | Status: AC
  Filled 2021-02-06: qty 50

## 2021-02-06 MED ORDER — POTASSIUM CHLORIDE IN NACL 20-0.9 MEQ/L-% IV SOLN
Freq: Once | INTRAVENOUS | Status: AC
  Filled 2021-02-06: qty 1000

## 2021-02-06 MED ORDER — SODIUM CHLORIDE 0.9 % IV SOLN
70.0000 mg/m2 | Freq: Once | INTRAVENOUS | Status: AC
  Administered 2021-02-06: 174 mg via INTRAVENOUS
  Filled 2021-02-06: qty 174

## 2021-02-06 MED ORDER — SODIUM CHLORIDE 0.9 % IV SOLN
150.0000 mg | Freq: Once | INTRAVENOUS | Status: AC
  Administered 2021-02-06: 150 mg via INTRAVENOUS
  Filled 2021-02-06: qty 150

## 2021-02-06 MED ORDER — HEPARIN SOD (PORK) LOCK FLUSH 100 UNIT/ML IV SOLN
500.0000 [IU] | Freq: Once | INTRAVENOUS | Status: AC | PRN
  Administered 2021-02-06: 500 [IU]

## 2021-02-06 MED ORDER — PALONOSETRON HCL INJECTION 0.25 MG/5ML
INTRAVENOUS | Status: AC
  Filled 2021-02-06: qty 5

## 2021-02-06 MED ORDER — SODIUM CHLORIDE 0.9% FLUSH
10.0000 mL | INTRAVENOUS | Status: DC | PRN
  Administered 2021-02-06: 10 mL

## 2021-02-06 NOTE — Progress Notes (Signed)
Hematology and Oncology Follow Up Visit  Javier Sellers [id number] 1950-09-24 70 y.o. 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  Secondary diagnosis: Left lung neoplasm consistent with non-small cell lung cancer diagnosed in July 2022.  Prior Therapy: He underwent TURBT on November 13, 2020 and the final pathology revealed high-grade urothelial carcinoma with small cell component of 70%.  Current therapy: Chemotherapy utilizing gemcitabine and cisplatin started on December 27, 2020.  He is here for day 1 of cycle 3 of therapy.  Interim History: Javier Sellers presents today for repeat evaluation.  Since the last visit, he reports no major changes in his health.  He continues to tolerate chemotherapy without any complaints.  He denies any nausea, vomiting or abdominal pain.  He denies any worsening neuropathy.  His appetite is down but overall weight is stable.     Medications: Unchanged on review. Current Outpatient Medications  Medication Sig Dispense Refill   amLODipine (NORVASC) 5 MG tablet Take 5 mg by mouth every morning.     diclofenac Sodium (VOLTAREN) 1 % GEL Apply 1 application topically daily as needed (knee pain).     lidocaine-prilocaine (EMLA) cream Apply 1 application topically as needed. 30 g 0   lisinopril (ZESTRIL) 40 MG tablet Take 40 mg by mouth every morning.     Menthol-Camphor (ICY HOT ADVANCED PAIN RELIEF) 16-11 % CREA Apply 1 application topically daily as needed (pain).     metoprolol succinate (TOPROL-XL) 100 MG 24 hr tablet Take 100 mg by mouth every morning.     pravastatin (PRAVACHOL) 80 MG tablet Take 80 mg by mouth at bedtime.     prochlorperazine (COMPAZINE) 10 MG tablet Take 1 tablet (10 mg total) by mouth every 6 (six) hours as needed for nausea or vomiting.  30 tablet 0   tamsulosin (FLOMAX) 0.4 MG CAPS capsule Take 0.4 mg by mouth daily.     No current facility-administered medications for this visit.     Allergies: No Known Allergies   Physical Exam: Blood pressure (!) 178/92, pulse 72, temperature 98.1 F (36.7 C), temperature source Tympanic, resp. rate 20, height 5\' 11"  (1.803 m), weight 261 lb 3.2 oz (118.5 kg), SpO2 100 %.   ECOG: 1    General appearance: Comfortable appearing without any discomfort Head: Normocephalic without any trauma Oropharynx: Mucous membranes are moist and pink without any thrush or ulcers. Eyes: Pupils are equal and round reactive to light. Lymph nodes: No cervical, supraclavicular, inguinal or axillary lymphadenopathy.   Heart:regular rate and rhythm.  S1 and S2 without leg edema. Lung: Clear without any rhonchi or wheezes.  No dullness to percussion. Abdomin: Soft, nontender, nondistended with good bowel sounds.  No hepatosplenomegaly. Musculoskeletal: No joint deformity or effusion.  Full range of motion noted. Neurological: No deficits noted on motor, sensory and deep tendon reflex exam. Skin: No petechial rash or dryness.  Appeared moist.     Lab Results: Lab Results  Component Value Date   WBC 4.8 01/23/2021   HGB 12.2 (L) 01/23/2021   HCT 34.8 (L) 01/23/2021   MCV 85.3 01/23/2021   PLT 207 01/23/2021     Chemistry      Component Value Date/Time   NA 137 01/23/2021 1211   K 3.8 01/23/2021 1211   CL 103 01/23/2021 1211  CO2 24 01/23/2021 1211   BUN 15 01/23/2021 1211   CREATININE 0.81 01/23/2021 1211      Component Value Date/Time   CALCIUM 9.4 01/23/2021 1211   ALKPHOS 83 01/23/2021 1211   AST 48 (H) 01/23/2021 1211   ALT 77 (H) 01/23/2021 1211   BILITOT 0.3 01/23/2021 1211       IMPRESSION: 1. Hypermetabolic LEFT upper lobe pulmonary nodule consistent bronchogenic carcinoma. 2. Two adjacent hypermetabolic LEFT hilar nodal metastasis. 3. Multifocal intensely  hypermetabolic HEPATIC METASTASIS. 4. Two hypermetabolic skeletal metastasis. One in RIGHT first rib and a second in the RIGHT acetabulum. 5. Asymmetric bladder wall thickening on the RIGHT.    Impression and Plan:  70 year old man with:  1.  Bladder cancer diagnosed in June 2022.  He was found to have stage IV high-grade urothelial carcinoma with muscle invasion and small cell component with hepatic metastasis.   He is currently on palliative chemotherapy he has tolerated very well without any major complaints.  Risks and benefits of proceeding with cycle 3 today were discussed and the plan is to update his staging scans before the next visit.  Switching to an etoposide cisplatin based regimen could be considered if he has less than adequate response.  Switching to checkpoint inhibitor but also be considered after cycle 4 pending his response.       2.  IV access: Port-A-Cath remains in use without any issues.   3.  Antiemetics: Compazine is available to him without any nausea or vomiting.   4.  Renal function surveillance: Creatinine clearance remains normal on platinum based therapy without any issues.   5.  Goals of care: Therapy is palliative although aggressive measures are warranted at this time.   6.  Lung neoplasm: This will be assessed on future imaging studies and any residual tumor could be addressed locally.   7.  Follow-up: In 1 week to complete cycle 3 and in 3 weeks for the start of cycle 4.   30  minutes were dedicated to this visit.  The time was spent on reviewing laboratory data, disease status update, treatment choices and future plan of care discussion.     Javier Button, MD 8/25/20228:02 AM

## 2021-02-06 NOTE — Progress Notes (Signed)
Per Dr. Alen Blew, ok for treatment today with ANC 1.4 and elevated blood pressure as pt. denies complaints of chest pain, dizziness, and no shortness of breath noted.

## 2021-02-06 NOTE — Patient Instructions (Addendum)
[facility]  Discharge Instructions: Thank you for choosing [facility] to provide your oncology and hematology care.   If you have a lab appointment with the [facility], please go directly to the [facility] and check in at the registration area.   Wear comfortable clothing and clothing appropriate for easy access to any Portacath or PICC line.   We strive to give you quality time with your provider. You may need to reschedule your appointment if you arrive late (15 or more minutes).  Arriving late affects you and other patients whose appointments are after yours.  Also, if you miss three or more appointments without notifying the office, you may be dismissed from the clinic at the provider's discretion.      For prescription refill requests, have your pharmacy contact our office and allow 72 hours for refills to be completed.    Today you received the following chemotherapy and/or immunotherapy agents: Gemcitabine (Gemzar) and Cisplatin.   To help prevent nausea and vomiting after your treatment, we encourage you to take your nausea medication as directed.  BELOW ARE SYMPTOMS THAT SHOULD BE REPORTED IMMEDIATELY: *FEVER GREATER THAN 100.4 F (38 C) OR HIGHER *CHILLS OR SWEATING *NAUSEA AND VOMITING THAT IS NOT CONTROLLED WITH YOUR NAUSEA MEDICATION *UNUSUAL SHORTNESS OF BREATH *UNUSUAL BRUISING OR BLEEDING *URINARY PROBLEMS (pain or burning when urinating, or frequent urination) *BOWEL PROBLEMS (unusual diarrhea, constipation, pain near the anus) TENDERNESS IN MOUTH AND THROAT WITH OR WITHOUT PRESENCE OF ULCERS (sore throat, sores in mouth, or a toothache) UNUSUAL RASH, SWELLING OR PAIN  UNUSUAL VAGINAL DISCHARGE OR ITCHING   Items with * indicate a potential emergency and should be followed up as soon as possible or go to the Emergency Department if any problems should occur.  Please show the CHEMOTHERAPY ALERT CARD or IMMUNOTHERAPY  ALERT CARD at check-in to the Emergency Department and triage nurse.  Should you have questions after your visit or need to cancel or reschedule your appointment, please contact [facility]  Dept: [phone]  and follow the prompts.  Office hours are 8:00 a.m. to 4:30 p.m. Monday - Friday. Please note that voicemails left after 4:00 p.m. may not be returned until the following business day.  We are closed weekends and major holidays. You have access to a nurse at all times for urgent questions. Please call the main number to the clinic Dept: [phone] and follow the prompts.   For any non-urgent questions, you may also contact your provider using MyChart. We now offer e-Visits for anyone 87 and older to request care online for non-urgent symptoms. For details visit [email or web address].   Also download the MyChart app! Go to the app store, search "MyChart", open the app, select [facility], and log in with your MyChart username and password.  Due to Covid, a mask is required upon entering the hospital/clinic. If you do not have a mask, one will be given to you upon arrival. For doctor visits, patients may have 1 support person aged 48 or older with them. For treatment visits, patients cannot have anyone with them due to current Covid guidelines and our immunocompromised population.  Hypomagnesemia Hypomagnesemia is a condition in which the level of magnesium in the blood is low. Magnesium is a mineral that is found in many foods. It is used in many different processes in the body. Hypomagnesemia can affect every organ in thebody. In severe cases, it can cause life-threatening  problems. What are the causes? This condition may be caused by: Not getting enough magnesium in your diet. Malnutrition. Problems with absorbing magnesium from the intestines. Dehydration. Alcohol abuse. Vomiting. Severe or chronic diarrhea. Some medicines, including medicines that make  you urinate more (diuretics). Certain diseases, such as kidney disease, diabetes, celiac disease, and overactive thyroid. What are the signs or symptoms? Symptoms of this condition include: Loss of appetite. Nausea and vomiting. Involuntary shaking or trembling of a body part (tremor). Muscle weakness. Tingling in the arms and legs. Sudden tightening of muscles (muscle spasms). Confusion. Psychiatric issues, such as depression, irritability, or psychosis. A feeling of fluttering of the heart. Seizures. These symptoms are more severe if magnesium levels drop suddenly. How is this diagnosed? This condition may be diagnosed based on: Your symptoms and medical history. A physical exam. Blood and urine tests. How is this treated? Treatment depends on the cause and the severity of the condition. It may be treated with: A magnesium supplement. This can be taken in pill form. If the condition is severe, magnesium is usually given through an IV. Changes to your diet. You may be directed to eat foods that have a lot of magnesium, such as green leafy vegetables, peas, beans, and nuts. Stopping any intake of alcohol. Follow these instructions at home:     Make sure that your diet includes foods with magnesium. Foods that have a lot of magnesium in them include: Green leafy vegetables, such as spinach and broccoli. Beans and peas. Nuts and seeds, such as almonds and sunflower seeds. Whole grains, such as whole grain bread and fortified cereals. Take magnesium supplements if your health care provider tells you to do that. Take them as directed. Take over-the-counter and prescription medicines only as told by your health care provider. Have your magnesium levels monitored as told by your health care provider. When you are active, drink fluids that contain electrolytes. Avoid drinking alcohol. Keep all follow-up visits as told by your health care provider. This is important. Contact a health  care provider if: You get worse instead of better. Your symptoms return. Get help right away if you: Develop severe muscle weakness. Have trouble breathing. Feel that your heart is racing. Summary Hypomagnesemia is a condition in which the level of magnesium in the blood is low. Hypomagnesemia can affect every organ in the body. Treatment may include eating more foods that contain magnesium, taking magnesium supplements, and not drinking alcohol. Have your magnesium levels monitored as told by your health care provider. This information is not intended to replace advice given to you by your health care provider. Make sure you discuss any questions you have with your healthcare provider. Document Revised: 11/02/2019 Document Reviewed: 11/02/2019 Elsevier Patient Education  [place].

## 2021-02-13 ENCOUNTER — Other Ambulatory Visit: Payer: Medicare Other

## 2021-02-13 ENCOUNTER — Ambulatory Visit: Payer: Medicare Other

## 2021-02-13 ENCOUNTER — Inpatient Hospital Stay: Payer: Medicare Other

## 2021-02-13 ENCOUNTER — Inpatient Hospital Stay: Payer: Medicare Other | Attending: Oncology

## 2021-02-13 ENCOUNTER — Other Ambulatory Visit: Payer: Self-pay

## 2021-02-13 VITALS — BP 173/79 | HR 70 | Temp 98.4°F | Resp 18 | Wt 258.5 lb

## 2021-02-13 DIAGNOSIS — C7951 Secondary malignant neoplasm of bone: Secondary | ICD-10-CM | POA: Diagnosis not present

## 2021-02-13 DIAGNOSIS — Z95828 Presence of other vascular implants and grafts: Secondary | ICD-10-CM

## 2021-02-13 DIAGNOSIS — Z5111 Encounter for antineoplastic chemotherapy: Secondary | ICD-10-CM | POA: Insufficient documentation

## 2021-02-13 DIAGNOSIS — C679 Malignant neoplasm of bladder, unspecified: Secondary | ICD-10-CM

## 2021-02-13 DIAGNOSIS — C787 Secondary malignant neoplasm of liver and intrahepatic bile duct: Secondary | ICD-10-CM | POA: Insufficient documentation

## 2021-02-13 LAB — CMP (CANCER CENTER ONLY)
ALT: 36 U/L (ref 0–44)
AST: 25 U/L (ref 15–41)
Albumin: 4 g/dL (ref 3.5–5.0)
Alkaline Phosphatase: 72 U/L (ref 38–126)
Anion gap: 13 (ref 5–15)
BUN: 13 mg/dL (ref 8–23)
CO2: 24 mmol/L (ref 22–32)
Calcium: 9 mg/dL (ref 8.9–10.3)
Chloride: 102 mmol/L (ref 98–111)
Creatinine: 0.83 mg/dL (ref 0.61–1.24)
GFR, Estimated: >60 mL/min (ref >60)
Glucose, Bld: 203 mg/dL — ABNORMAL HIGH (ref 70–99)
Potassium: 3.8 mmol/L (ref 3.5–5.1)
Sodium: 139 mmol/L (ref 135–145)
Total Bilirubin: 0.4 mg/dL (ref 0.3–1.2)
Total Protein: 7.2 g/dL (ref 6.5–8.1)

## 2021-02-13 LAB — CBC WITH DIFFERENTIAL (CANCER CENTER ONLY)
Abs Immature Granulocytes: 0.05 10*3/uL (ref 0.00–0.07)
Basophils Absolute: 0.1 10*3/uL (ref 0.0–0.1)
Basophils Relative: 2 %
Eosinophils Absolute: 0 10*3/uL (ref 0.0–0.5)
Eosinophils Relative: 1 %
HCT: 31.1 % — ABNORMAL LOW (ref 39.0–52.0)
Hemoglobin: 10.7 g/dL — ABNORMAL LOW (ref 13.0–17.0)
Immature Granulocytes: 1 %
Lymphocytes Relative: 22 %
Lymphs Abs: 0.9 10*3/uL (ref 0.7–4.0)
MCH: 29.2 pg (ref 26.0–34.0)
MCHC: 34.4 g/dL (ref 30.0–36.0)
MCV: 85 fL (ref 80.0–100.0)
Monocytes Absolute: 0.3 10*3/uL (ref 0.1–1.0)
Monocytes Relative: 7 %
Neutro Abs: 2.6 10*3/uL (ref 1.7–7.7)
Neutrophils Relative %: 67 %
Platelet Count: 152 10*3/uL (ref 150–400)
RBC: 3.66 MIL/uL — ABNORMAL LOW (ref 4.22–5.81)
RDW: 15.2 % (ref 11.5–15.5)
WBC Count: 3.9 10*3/uL — ABNORMAL LOW (ref 4.0–10.5)
nRBC: 0 % (ref 0.0–0.2)

## 2021-02-13 LAB — MAGNESIUM: Magnesium: 1.3 mg/dL — ABNORMAL LOW (ref 1.7–2.4)

## 2021-02-13 MED ORDER — SODIUM CHLORIDE 0.9 % IV SOLN: Freq: Once | INTRAVENOUS | Status: AC

## 2021-02-13 MED ORDER — SODIUM CHLORIDE 0.9 % IV SOLN
1000.0000 mg/m2 | Freq: Once | INTRAVENOUS | Status: AC
  Administered 2021-02-13: 2508 mg via INTRAVENOUS
  Filled 2021-02-13: qty 65.96

## 2021-02-13 MED ORDER — SODIUM CHLORIDE 0.9% FLUSH
10.0000 mL | Freq: Once | INTRAVENOUS | Status: AC
  Administered 2021-02-13: 10 mL

## 2021-02-13 MED ORDER — HEPARIN SOD (PORK) LOCK FLUSH 100 UNIT/ML IV SOLN
500.0000 [IU] | Freq: Once | INTRAVENOUS | Status: AC | PRN
  Administered 2021-02-13: 500 [IU]

## 2021-02-13 MED ORDER — PROCHLORPERAZINE MALEATE 10 MG PO TABS
10.0000 mg | ORAL_TABLET | Freq: Once | ORAL | Status: AC
  Administered 2021-02-13: 10 mg via ORAL
  Filled 2021-02-13: qty 1

## 2021-02-13 MED ORDER — SODIUM CHLORIDE 0.9% FLUSH
10.0000 mL | INTRAVENOUS | Status: DC | PRN
Start: 2021-02-13 — End: 2021-02-13
  Administered 2021-02-13: 10 mL

## 2021-02-13 NOTE — Patient Instructions (Signed)
[facility]  Discharge Instructions: Thank you for choosing [facility] to provide your oncology and hematology care.   If you have a lab appointment with the Cancer Center, please go directly to the Cancer Center and check in at the registration area.   Wear comfortable clothing and clothing appropriate for easy access to any Portacath or PICC line.   We strive to give you quality time with your provider. You may need to reschedule your appointment if you arrive late (15 or more minutes).  Arriving late affects you and other patients whose appointments are after yours.  Also, if you miss three or more appointments without notifying the office, you may be dismissed from the clinic at the provider's discretion.      For prescription refill requests, have your pharmacy contact our office and allow 72 hours for refills to be completed.    Today you received the following chemotherapy and/or immunotherapy agents Gemzar      To help prevent nausea and vomiting after your treatment, we encourage you to take your nausea medication as directed.  BELOW ARE SYMPTOMS THAT SHOULD BE REPORTED IMMEDIATELY: *FEVER GREATER THAN 100.4 F (38 C) OR HIGHER *CHILLS OR SWEATING *NAUSEA AND VOMITING THAT IS NOT CONTROLLED WITH YOUR NAUSEA MEDICATION *UNUSUAL SHORTNESS OF BREATH *UNUSUAL BRUISING OR BLEEDING *URINARY PROBLEMS (pain or burning when urinating, or frequent urination) *BOWEL PROBLEMS (unusual diarrhea, constipation, pain near the anus) TENDERNESS IN MOUTH AND THROAT WITH OR WITHOUT PRESENCE OF ULCERS (sore throat, sores in mouth, or a toothache) UNUSUAL RASH, SWELLING OR PAIN  UNUSUAL VAGINAL DISCHARGE OR ITCHING   Items with * indicate a potential emergency and should be followed up as soon as possible or go to the Emergency Department if any problems should occur.  Please show the CHEMOTHERAPY ALERT CARD or IMMUNOTHERAPY ALERT CARD at check-in to the  Emergency Department and triage nurse.  Should you have questions after your visit or need to cancel or reschedule your appointment, please contact [facility]  Dept: [phone]  and follow the prompts.  Office hours are 8:00 a.m. to 4:30 p.m. Monday - Friday. Please note that voicemails left after 4:00 p.m. may not be returned until the following business day.  We are closed weekends and major holidays. You have access to a nurse at all times for urgent questions. Please call the main number to the clinic Dept: [phone] and follow the prompts.   For any non-urgent questions, you may also contact your provider using MyChart. We now offer e-Visits for anyone 18 and older to request care online for non-urgent symptoms. For details visit [email or web address].   Also download the MyChart app! Go to the app store, search "MyChart", open the app, select [facility], and log in with your MyChart username and password.  Due to Covid, a mask is required upon entering the hospital/clinic. If you do not have a mask, one will be given to you upon arrival. For doctor visits, patients may have 1 support person aged 18 or older with them. For treatment visits, patients cannot have anyone with them due to current Covid guidelines and our immunocompromised population.   

## 2021-02-25 ENCOUNTER — Other Ambulatory Visit: Payer: Self-pay

## 2021-02-25 ENCOUNTER — Ambulatory Visit (HOSPITAL_COMMUNITY)
Admission: RE | Admit: 2021-02-25 | Discharge: 2021-02-25 | Disposition: A | Discharge status: Home / Self Care | Payer: Medicare Other | Source: Ambulatory Visit | Attending: Oncology | Admitting: Oncology

## 2021-02-25 DIAGNOSIS — Z85118 Personal history of other malignant neoplasm of bronchus and lung: Secondary | ICD-10-CM | POA: Diagnosis not present

## 2021-02-25 DIAGNOSIS — C3492 Malignant neoplasm of unspecified part of left bronchus or lung: Secondary | ICD-10-CM | POA: Insufficient documentation

## 2021-02-25 DIAGNOSIS — I700 Atherosclerosis of aorta: Secondary | ICD-10-CM | POA: Insufficient documentation

## 2021-02-25 DIAGNOSIS — K7689 Other specified diseases of liver: Secondary | ICD-10-CM | POA: Diagnosis not present

## 2021-02-25 DIAGNOSIS — C3490 Malignant neoplasm of unspecified part of unspecified bronchus or lung: Secondary | ICD-10-CM | POA: Diagnosis present

## 2021-02-25 DIAGNOSIS — C787 Secondary malignant neoplasm of liver and intrahepatic bile duct: Secondary | ICD-10-CM | POA: Diagnosis not present

## 2021-02-25 DIAGNOSIS — C7951 Secondary malignant neoplasm of bone: Secondary | ICD-10-CM | POA: Insufficient documentation

## 2021-02-25 DIAGNOSIS — C679 Malignant neoplasm of bladder, unspecified: Secondary | ICD-10-CM | POA: Diagnosis not present

## 2021-02-25 LAB — GLUCOSE, CAPILLARY: Glucose-Capillary: 124 mg/dL — ABNORMAL HIGH (ref 70–99)

## 2021-02-25 MED ORDER — FLUDEOXYGLUCOSE F - 18 (FDG) INJECTION
10.0000 | Freq: Once | INTRAVENOUS | Status: AC | PRN
  Administered 2021-02-25: 13.9 via INTRAVENOUS

## 2021-02-28 ENCOUNTER — Inpatient Hospital Stay: Payer: Medicare Other

## 2021-02-28 ENCOUNTER — Inpatient Hospital Stay (HOSPITAL_BASED_OUTPATIENT_CLINIC_OR_DEPARTMENT_OTHER): Payer: Medicare Other | Admitting: Oncology

## 2021-02-28 ENCOUNTER — Other Ambulatory Visit: Payer: Self-pay

## 2021-02-28 ENCOUNTER — Inpatient Hospital Stay: Payer: Medicare Other | Admitting: Dietician

## 2021-02-28 VITALS — BP 178/82 | HR 80 | Temp 97.9°F | Resp 19 | Ht 71.0 in | Wt 261.0 lb

## 2021-02-28 DIAGNOSIS — C679 Malignant neoplasm of bladder, unspecified: Secondary | ICD-10-CM

## 2021-02-28 DIAGNOSIS — C787 Secondary malignant neoplasm of liver and intrahepatic bile duct: Secondary | ICD-10-CM | POA: Diagnosis not present

## 2021-02-28 DIAGNOSIS — Z95828 Presence of other vascular implants and grafts: Secondary | ICD-10-CM

## 2021-02-28 DIAGNOSIS — Z5111 Encounter for antineoplastic chemotherapy: Secondary | ICD-10-CM | POA: Diagnosis not present

## 2021-02-28 DIAGNOSIS — C7951 Secondary malignant neoplasm of bone: Secondary | ICD-10-CM | POA: Diagnosis not present

## 2021-02-28 LAB — CBC WITH DIFFERENTIAL (CANCER CENTER ONLY)
Abs Immature Granulocytes: 0.01 10*3/uL (ref 0.00–0.07)
Basophils Absolute: 0 10*3/uL (ref 0.0–0.1)
Basophils Relative: 1 %
Eosinophils Absolute: 0.1 10*3/uL (ref 0.0–0.5)
Eosinophils Relative: 3 %
HCT: 29.8 % — ABNORMAL LOW (ref 39.0–52.0)
Hemoglobin: 10 g/dL — ABNORMAL LOW (ref 13.0–17.0)
Immature Granulocytes: 0 %
Lymphocytes Relative: 20 %
Lymphs Abs: 0.6 10*3/uL — ABNORMAL LOW (ref 0.7–4.0)
MCH: 29.9 pg (ref 26.0–34.0)
MCHC: 33.6 g/dL (ref 30.0–36.0)
MCV: 89 fL (ref 80.0–100.0)
Monocytes Absolute: 0.4 10*3/uL (ref 0.1–1.0)
Monocytes Relative: 14 %
Neutro Abs: 1.9 10*3/uL (ref 1.7–7.7)
Neutrophils Relative %: 62 %
Platelet Count: 135 10*3/uL — ABNORMAL LOW (ref 150–400)
RBC: 3.35 MIL/uL — ABNORMAL LOW (ref 4.22–5.81)
RDW: 19.2 % — ABNORMAL HIGH (ref 11.5–15.5)
WBC Count: 3 10*3/uL — ABNORMAL LOW (ref 4.0–10.5)
nRBC: 0 % (ref 0.0–0.2)

## 2021-02-28 LAB — CMP (CANCER CENTER ONLY)
ALT: 14 U/L (ref 0–44)
AST: 16 U/L (ref 15–41)
Albumin: 3.9 g/dL (ref 3.5–5.0)
Alkaline Phosphatase: 72 U/L (ref 38–126)
Anion gap: 10 (ref 5–15)
BUN: 9 mg/dL (ref 8–23)
CO2: 24 mmol/L (ref 22–32)
Calcium: 9.3 mg/dL (ref 8.9–10.3)
Chloride: 104 mmol/L (ref 98–111)
Creatinine: 0.83 mg/dL (ref 0.61–1.24)
GFR, Estimated: >60 mL/min (ref >60)
Glucose, Bld: 219 mg/dL — ABNORMAL HIGH (ref 70–99)
Potassium: 3.7 mmol/L (ref 3.5–5.1)
Sodium: 138 mmol/L (ref 135–145)
Total Bilirubin: 0.6 mg/dL (ref 0.3–1.2)
Total Protein: 6.8 g/dL (ref 6.5–8.1)

## 2021-02-28 LAB — MAGNESIUM: Magnesium: 1.5 mg/dL — ABNORMAL LOW (ref 1.7–2.4)

## 2021-02-28 MED ORDER — HEPARIN SOD (PORK) LOCK FLUSH 100 UNIT/ML IV SOLN
500.0000 [IU] | Freq: Once | INTRAVENOUS | Status: AC | PRN
  Administered 2021-02-28: 500 [IU]

## 2021-02-28 MED ORDER — POTASSIUM CHLORIDE IN NACL 20-0.9 MEQ/L-% IV SOLN
Freq: Once | INTRAVENOUS | Status: AC
  Filled 2021-02-28: qty 1000

## 2021-02-28 MED ORDER — PALONOSETRON HCL INJECTION 0.25 MG/5ML
0.2500 mg | Freq: Once | INTRAVENOUS | Status: AC
  Administered 2021-02-28: 0.25 mg via INTRAVENOUS
  Filled 2021-02-28: qty 5

## 2021-02-28 MED ORDER — SODIUM CHLORIDE 0.9 % IV SOLN
150.0000 mg | Freq: Once | INTRAVENOUS | Status: AC
  Administered 2021-02-28: 150 mg via INTRAVENOUS
  Filled 2021-02-28: qty 150

## 2021-02-28 MED ORDER — SODIUM CHLORIDE 0.9 % IV SOLN
10.0000 mg | Freq: Once | INTRAVENOUS | Status: AC
  Administered 2021-02-28: 10 mg via INTRAVENOUS
  Filled 2021-02-28: qty 10

## 2021-02-28 MED ORDER — SODIUM CHLORIDE 0.9 % IV SOLN
1000.0000 mg/m2 | Freq: Once | INTRAVENOUS | Status: AC
  Administered 2021-02-28: 2508 mg via INTRAVENOUS
  Filled 2021-02-28: qty 65.96

## 2021-02-28 MED ORDER — SODIUM CHLORIDE 0.9% FLUSH
10.0000 mL | INTRAVENOUS | Status: DC | PRN
  Administered 2021-02-28: 10 mL

## 2021-02-28 MED ORDER — SODIUM CHLORIDE 0.9 % IV SOLN
70.0000 mg/m2 | Freq: Once | INTRAVENOUS | Status: AC
  Administered 2021-02-28: 174 mg via INTRAVENOUS
  Filled 2021-02-28: qty 174

## 2021-02-28 MED ORDER — MAGNESIUM SULFATE 2 GM/50ML IV SOLN
2.0000 g | Freq: Once | INTRAVENOUS | Status: AC
  Administered 2021-02-28: 2 g via INTRAVENOUS
  Filled 2021-02-28: qty 50

## 2021-02-28 MED ORDER — SODIUM CHLORIDE 0.9 % IV SOLN: Freq: Once | INTRAVENOUS | Status: AC

## 2021-02-28 MED ORDER — SODIUM CHLORIDE 0.9% FLUSH
10.0000 mL | Freq: Once | INTRAVENOUS | Status: AC
  Administered 2021-02-28: 10 mL

## 2021-02-28 NOTE — Progress Notes (Signed)
Nutrition Assessment   Reason for Assessment: MST   ASSESSMENT: 70 year old male with stage IV bladder cancer. S/p TURBT. Patient currently receiving Gemcitabine/Cisplatin. He is followed by Dr. Alen Blew  Met with patient during infusion. He reports he does not have an appetite, having altered taste. Reports water taste metal and has tried flavored water/enhancers. Patient reports he feels nauseas if he is up too long and compazine makes it worse. Patient does not want to try a different medication. He reports sleeping a lot, this helps with nausea and "he doesn't get cranky with family if he is sleeping" He reports tolerating a few bites of something light, recalls soups, hard boiled egg or egg salad. He likes Ensure, usually will drink one. Patient reports he begins to feel better 5-6 days after treatment and is eating well for 2-3 days before receiving next treatment. This morning he ate a western omelette and had cup of coffee.   Nutrition Focused Physical Exam: deferred   Medications: Compazine   Labs: Glucose 219, Mg 1.5   Anthropometrics:   Height: 5'11" Weight: 261 lb  UBW: 275 lb (per pt) BMI: 36.40  NUTRITION DIAGNOSIS: Inadequate oral intake related to cancer and associated treatment side effects as evidenced by reported altered taste, decreased appetite, nausea, 5.1% (14 lb) decrease from usual weight in 2 months. This is insignificant for time frame  INTERVENTION:  Educated on importance of adequate calorie and protein energy intake to maintain weights, strength, nutrition Encouraged small frequent meals and snacks with adequate calories and protein throughout the day - handout with ideas provided Encouraged 3-4 Ensure Complete/equivalent daily with decreased intake - coupons provided Discussed tips for nausea, foods best tolerated and foods to avoid - handout provided Discussed strategies for altered taste, encouraged baking soda salt water rinses several times day -  handout with tips and recipe for mouth rinse provided Contact information provided  MONITORING, EVALUATION, GOAL: Patient will tolerate adequate calories and protein to minimize further weight loss   Next Visit: To be scheduled

## 2021-02-28 NOTE — Progress Notes (Signed)
Hematology and Oncology Follow Up Visit  Javier Sellers [id number] [birth year] 70 y.o. 02/28/2021 8:36 AM Lin Landsman, [name], MDRedding, [name], MD   Principle Diagnosis: 70 year old man with bladder cancer diagnosed in 2022.  He presented with stage IV high-grade urothelial carcinoma of the bladder with small cell component.  He has metastatic disease to the bone and liver.  Secondary diagnosis: Left lung neoplasm consistent with non-small cell lung cancer diagnosed in July 2022.  Prior Therapy: He underwent TURBT on November 13, 2020 and the final pathology revealed high-grade urothelial carcinoma with small cell component of 70%.  Current therapy: Chemotherapy utilizing gemcitabine and cisplatin started on December 27, 2020.  He is here for day 1 of cycle 4 of therapy.  Interim History: Javier Sellers is here for a follow-up visit.  Since the last visit, he reports a few complaints associated with chemotherapy.  He has reported some fatigue and some tiredness but no nausea or vomiting.  He reports decrease in his appetite but still able to eat and maintain his weight.     Medications: Reviewed without changes. Current Outpatient Medications  Medication Sig Dispense Refill   amLODipine (NORVASC) 5 MG tablet Take 5 mg by mouth every morning.     diclofenac Sodium (VOLTAREN) 1 % GEL Apply 1 application topically daily as needed (knee pain).     lidocaine-prilocaine (EMLA) cream Apply 1 application topically as needed. 30 g 0   lisinopril (ZESTRIL) 40 MG tablet Take 40 mg by mouth every morning.     Menthol-Camphor (ICY HOT ADVANCED PAIN RELIEF) 16-11 % CREA Apply 1 application topically daily as needed (pain).     metoprolol succinate (TOPROL-XL) 100 MG 24 hr tablet Take 100 mg by mouth every morning.     pravastatin (PRAVACHOL) 80 MG tablet Take 80 mg by mouth at bedtime.     prochlorperazine (COMPAZINE) 10 MG tablet Take 1 tablet (10 mg total) by mouth every 6 (six) hours as needed for nausea or  vomiting. 30 tablet 0   tamsulosin (FLOMAX) 0.4 MG CAPS capsule Take 0.4 mg by mouth daily.     No current facility-administered medications for this visit.     Allergies: No Known Allergies   Physical Exam:  Blood pressure (!) 178/82, pulse 80, temperature 97.9 F (36.6 C), temperature source Oral, resp. rate 19, height 5\' 11"  (1.803 m), weight 261 lb (118.4 kg), SpO2 99 %.   ECOG: 1    General appearance: Alert, awake without any distress. Head: Atraumatic without abnormalities Oropharynx: Without any thrush or ulcers. Eyes: No scleral icterus. Lymph nodes: No lymphadenopathy noted in the cervical, supraclavicular, or axillary nodes Heart:regular rate and rhythm, without any murmurs or gallops.   Lung: Clear to auscultation without any rhonchi, wheezes or dullness to percussion. Abdomin: Soft, nontender without any shifting dullness or ascites. Musculoskeletal: No clubbing or cyanosis. Neurological: No motor or sensory deficits. Skin: No rashes or lesions.      Lab Results: Lab Results  Component Value Date   WBC 3.9 (L) 02/13/2021   HGB 10.7 (L) 02/13/2021   HCT 31.1 (L) 02/13/2021   MCV 85.0 02/13/2021   PLT 152 02/13/2021     Chemistry      Component Value Date/Time   NA 139 02/13/2021 1141   K 3.8 02/13/2021 1141   CL 102 02/13/2021 1141   CO2 24 02/13/2021 1141   BUN 13 02/13/2021 1141   CREATININE 0.83 02/13/2021 1141  Component Value Date/Time   CALCIUM 9.0 02/13/2021 1141   ALKPHOS 72 02/13/2021 1141   AST 25 02/13/2021 1141   ALT 36 02/13/2021 1141   BILITOT 0.4 02/13/2021 1141          Impression and Plan:  70 year old man with:  1.  Stage IV high-grade urothelial carcinoma with muscle invasion and small cell component with metastatic disease to the liver, bone, and others.   PET scan obtained on 02/25/2021 was personally reviewed and showed that is substantial along response to therapy.  He has tolerated chemotherapy well  without any major complaints.  Risks and benefits of continuing this treatment to complete total 6 cycles and switching to maintenance immunotherapy versus switching to immunotherapy now were reviewed.  After discussion today, we opted to proceed with total of 6 cycles before proceeding with maintenance immunotherapy.       2.  IV access: Port-A-Cath currently in use without any issues.   3.  Antiemetics: No nausea or vomiting reported at this time.  Compazine available to him.   4.  Renal function surveillance: His kidney function remains normal on platinum therapy without any issues.   5.  Goals of care: His disease is incurable but aggressive measures are warranted given his reasonable performance status.   6.  Lung neoplasm: Likely a different primary with a non-small cell component.  This is responding to the current systemic treatment and local therapy can be utilized in the future if needed.  7.  Neutropenia: Absolute neutrophil count is adequate at this time.  We will make Some dose adjustments in the future if needed.   8.  Follow-up: will be in 3 weeks for the start of cycle 5.   30  minutes were spent on this encounter.  The time was dedicated to reviewing disease status update, reviewing imaging studies, discussing treatment choices and complications related to therapy.     Zola Button, MD 9/16/20228:36 AM

## 2021-02-28 NOTE — Patient Instructions (Signed)
[facility]COLOG[facility]  Discharge Instructions: Thank you for choosing [facility] to provide your oncology and hematology care.   If you have a lab appointment with the [facility], please go directly to the [facility] and check in at the registration area.   Wear comfortable clothing and clothing appropriate for easy access to any Portacath or PICC line.   We strive to give you quality time with your provider. You may need to reschedule your appointment if you arrive late (15 or more minutes).  Arriving late affects you and other patients whose appointments are after yours.  Also, if you miss three or more appointments without notifying the office, you may be dismissed from the clinic at the provider's discretion.      For prescription refill requests, have your pharmacy contact our office and allow 72 hours for refills to be completed.    Today you received the following chemotherapy and/or immunotherapy agents: Gemcitabine (Gemzar) and Cisplatin.   To help prevent nausea and vomiting after your treatment, we encourage you to take your nausea medication as directed.  BELOW ARE SYMPTOMS THAT SHOULD BE REPORTED IMMEDIATELY: *FEVER GREATER THAN 100.4 F (38 C) OR HIGHER *CHILLS OR SWEATING *NAUSEA AND VOMITING THAT IS NOT CONTROLLED WITH YOUR NAUSEA MEDICATION *UNUSUAL SHORTNESS OF BREATH *UNUSUAL BRUISING OR BLEEDING *URINARY PROBLEMS (pain or burning when urinating, or frequent urination) *BOWEL PROBLEMS (unusual diarrhea, constipation, pain near the anus) TENDERNESS IN MOUTH AND THROAT WITH OR WITHOUT PRESENCE OF ULCERS (sore throat, sores in mouth, or a toothache) UNUSUAL RASH, SWELLING OR PAIN  UNUSUAL VAGINAL DISCHARGE OR ITCHING   Items with * indicate a potential emergency and should be followed up as soon as possible or go to the Emergency Department if any problems should occur.  Please show the CHEMOTHERAPY ALERT CARD or IMMUNOTHERAPY  ALERT CARD at check-in to the Emergency Department and triage nurse.  Should you have questions after your visit or need to cancel or reschedule your appointment, please contact [facility]  Dept: [phone]  and follow the prompts.  Office hours are 8:00 a.m. to 4:30 p.m. Monday - Friday. Please note that voicemails left after 4:00 p.m. may not be returned until the following business day.  We are closed weekends and major holidays. You have access to a nurse at all times for urgent questions. Please call the main number to the clinic Dept: [phone] and follow the prompts.   For any non-urgent questions, you may also contact your provider using MyChart. We now offer e-Visits for anyone 68 and older to request care online for non-urgent symptoms. For details visit [email or web address].   Also download the MyChart app! Go to the app store, search "MyChart", open the app, select [facility], and log in with your MyChart username and password.  Due to Covid, a mask is required upon entering the hospital/clinic. If you do not have a mask, one will be given to you upon arrival. For doctor visits, patients may have 1 support person aged 56 or older with them. For treatment visits, patients cannot have anyone with them due to current Covid guidelines and our immunocompromised population.  Hypomagnesemia Hypomagnesemia is a condition in which the level of magnesium in the blood is low. Magnesium is a mineral that is found in many foods. It is used in many different processes in the body. Hypomagnesemia can affect every organ in thebody. In severe cases, it can cause life-threatening  problems. What are the causes? This condition may be caused by: Not getting enough magnesium in your diet. Malnutrition. Problems with absorbing magnesium from the intestines. Dehydration. Alcohol abuse. Vomiting. Severe or chronic diarrhea. Some medicines, including medicines that make  you urinate more (diuretics). Certain diseases, such as kidney disease, diabetes, celiac disease, and overactive thyroid. What are the signs or symptoms? Symptoms of this condition include: Loss of appetite. Nausea and vomiting. Involuntary shaking or trembling of a body part (tremor). Muscle weakness. Tingling in the arms and legs. Sudden tightening of muscles (muscle spasms). Confusion. Psychiatric issues, such as depression, irritability, or psychosis. A feeling of fluttering of the heart. Seizures. These symptoms are more severe if magnesium levels drop suddenly. How is this diagnosed? This condition may be diagnosed based on: Your symptoms and medical history. A physical exam. Blood and urine tests. How is this treated? Treatment depends on the cause and the severity of the condition. It may be treated with: A magnesium supplement. This can be taken in pill form. If the condition is severe, magnesium is usually given through an IV. Changes to your diet. You may be directed to eat foods that have a lot of magnesium, such as green leafy vegetables, peas, beans, and nuts. Stopping any intake of alcohol. Follow these instructions at home:     Make sure that your diet includes foods with magnesium. Foods that have a lot of magnesium in them include: Green leafy vegetables, such as spinach and broccoli. Beans and peas. Nuts and seeds, such as almonds and sunflower seeds. Whole grains, such as whole grain bread and fortified cereals. Take magnesium supplements if your health care provider tells you to do that. Take them as directed. Take over-the-counter and prescription medicines only as told by your health care provider. Have your magnesium levels monitored as told by your health care provider. When you are active, drink fluids that contain electrolytes. Avoid drinking alcohol. Keep all follow-up visits as told by your health care provider. This is important. Contact a health  care provider if: You get worse instead of better. Your symptoms return. Get help right away if you: Develop severe muscle weakness. Have trouble breathing. Feel that your heart is racing. Summary Hypomagnesemia is a condition in which the level of magnesium in the blood is low. Hypomagnesemia can affect every organ in the body. Treatment may include eating more foods that contain magnesium, taking magnesium supplements, and not drinking alcohol. Have your magnesium levels monitored as told by your health care provider. This information is not intended to replace advice given to you by your health care provider. Make sure you discuss any questions you have with your healthcare provider. Document Revised: 11/02/2019 Document Reviewed: 11/02/2019 Elsevier Patient Education  [employer].

## 2021-03-07 ENCOUNTER — Inpatient Hospital Stay: Payer: Medicare Other

## 2021-03-07 ENCOUNTER — Telehealth: Payer: Self-pay | Admitting: Oncology

## 2021-03-07 ENCOUNTER — Other Ambulatory Visit: Payer: Self-pay

## 2021-03-07 VITALS — BP 166/87 | HR 77 | Temp 98.1°F | Resp 18 | Wt 253.5 lb

## 2021-03-07 DIAGNOSIS — Z5111 Encounter for antineoplastic chemotherapy: Secondary | ICD-10-CM | POA: Diagnosis not present

## 2021-03-07 DIAGNOSIS — C679 Malignant neoplasm of bladder, unspecified: Secondary | ICD-10-CM | POA: Diagnosis not present

## 2021-03-07 DIAGNOSIS — C7951 Secondary malignant neoplasm of bone: Secondary | ICD-10-CM | POA: Diagnosis not present

## 2021-03-07 DIAGNOSIS — Z95828 Presence of other vascular implants and grafts: Secondary | ICD-10-CM

## 2021-03-07 DIAGNOSIS — C787 Secondary malignant neoplasm of liver and intrahepatic bile duct: Secondary | ICD-10-CM | POA: Diagnosis not present

## 2021-03-07 LAB — CBC WITH DIFFERENTIAL (CANCER CENTER ONLY)
Abs Immature Granulocytes: 0.02 10*3/uL (ref 0.00–0.07)
Basophils Absolute: 0.1 10*3/uL (ref 0.0–0.1)
Basophils Relative: 1 %
Eosinophils Absolute: 0 10*3/uL (ref 0.0–0.5)
Eosinophils Relative: 1 %
HCT: 30.6 % — ABNORMAL LOW (ref 39.0–52.0)
Hemoglobin: 10.6 g/dL — ABNORMAL LOW (ref 13.0–17.0)
Immature Granulocytes: 1 %
Lymphocytes Relative: 23 %
Lymphs Abs: 0.8 10*3/uL (ref 0.7–4.0)
MCH: 30 pg (ref 26.0–34.0)
MCHC: 34.6 g/dL (ref 30.0–36.0)
MCV: 86.7 fL (ref 80.0–100.0)
Monocytes Absolute: 0.2 10*3/uL (ref 0.1–1.0)
Monocytes Relative: 6 %
Neutro Abs: 2.5 10*3/uL (ref 1.7–7.7)
Neutrophils Relative %: 68 %
Platelet Count: 141 10*3/uL — ABNORMAL LOW (ref 150–400)
RBC: 3.53 MIL/uL — ABNORMAL LOW (ref 4.22–5.81)
RDW: 18.2 % — ABNORMAL HIGH (ref 11.5–15.5)
WBC Count: 3.6 10*3/uL — ABNORMAL LOW (ref 4.0–10.5)
nRBC: 0 % (ref 0.0–0.2)

## 2021-03-07 LAB — CMP (CANCER CENTER ONLY)
ALT: 42 U/L (ref 0–44)
AST: 32 U/L (ref 15–41)
Albumin: 4.2 g/dL (ref 3.5–5.0)
Alkaline Phosphatase: 76 U/L (ref 38–126)
Anion gap: 13 (ref 5–15)
BUN: 17 mg/dL (ref 8–23)
CO2: 23 mmol/L (ref 22–32)
Calcium: 9.8 mg/dL (ref 8.9–10.3)
Chloride: 102 mmol/L (ref 98–111)
Creatinine: 0.85 mg/dL (ref 0.61–1.24)
GFR, Estimated: >60 mL/min (ref >60)
Glucose, Bld: 172 mg/dL — ABNORMAL HIGH (ref 70–99)
Potassium: 4.1 mmol/L (ref 3.5–5.1)
Sodium: 138 mmol/L (ref 135–145)
Total Bilirubin: 0.5 mg/dL (ref 0.3–1.2)
Total Protein: 7.4 g/dL (ref 6.5–8.1)

## 2021-03-07 LAB — MAGNESIUM: Magnesium: 1.2 mg/dL — ABNORMAL LOW (ref 1.7–2.4)

## 2021-03-07 MED ORDER — PROCHLORPERAZINE MALEATE 10 MG PO TABS
10.0000 mg | ORAL_TABLET | Freq: Once | ORAL | Status: AC
  Administered 2021-03-07: 10 mg via ORAL
  Filled 2021-03-07: qty 1

## 2021-03-07 MED ORDER — SODIUM CHLORIDE 0.9 % IV SOLN
1000.0000 mg/m2 | Freq: Once | INTRAVENOUS | Status: AC
  Administered 2021-03-07: 2508 mg via INTRAVENOUS
  Filled 2021-03-07: qty 65.96

## 2021-03-07 MED ORDER — MAGNESIUM SULFATE 2 GM/50ML IV SOLN
INTRAVENOUS | Status: AC
  Administered 2021-03-07: 2 g via INTRAVENOUS
  Filled 2021-03-07: qty 50

## 2021-03-07 MED ORDER — MAGNESIUM SULFATE 2 GM/50ML IV SOLN: 2.0000 g | Freq: Once | INTRAVENOUS | Status: AC

## 2021-03-07 MED ORDER — SODIUM CHLORIDE 0.9% FLUSH
10.0000 mL | Freq: Once | INTRAVENOUS | Status: AC
Start: 2021-03-07 — End: 2021-03-07
  Administered 2021-03-07: 10 mL

## 2021-03-07 MED ORDER — HEPARIN SOD (PORK) LOCK FLUSH 100 UNIT/ML IV SOLN
500.0000 [IU] | Freq: Once | INTRAVENOUS | Status: AC | PRN
  Administered 2021-03-07: 500 [IU]

## 2021-03-07 MED ORDER — SODIUM CHLORIDE 0.9% FLUSH
10.0000 mL | INTRAVENOUS | Status: DC | PRN
Start: 2021-03-07 — End: 2021-03-07
  Administered 2021-03-07: 10 mL

## 2021-03-07 MED ORDER — SODIUM CHLORIDE 0.9 % IV SOLN: Freq: Once | INTRAVENOUS | Status: AC

## 2021-03-07 NOTE — Progress Notes (Signed)
Patients Mag level 1.2  Please give Magnesium sulfate 2 gm IVPB and 500 ml NS x 1 today.  T.O. Dr Creta Levin, PharmD

## 2021-03-07 NOTE — Patient Instructions (Signed)
[facility] ONCOL[facility]  Discharge Instructions: Thank you for choosing [facility] to provide your oncology and hematology care.   If you have a lab appointment with the [facility], please go directly to the [facility] and check in at the registration area.   Wear comfortable clothing and clothing appropriate for easy access to any Portacath or PICC line.   We strive to give you quality time with your provider. You may need to reschedule your appointment if you arrive late (15 or more minutes).  Arriving late affects you and other patients whose appointments are after yours.  Also, if you miss three or more appointments without notifying the office, you may be dismissed from the clinic at the provider's discretion.      For prescription refill requests, have your pharmacy contact our office and allow 72 hours for refills to be completed.    Today you received the following chemotherapy and/or immunotherapy agents gemcitabine       To help prevent nausea and vomiting after your treatment, we encourage you to take your nausea medication as directed.  BELOW ARE SYMPTOMS THAT SHOULD BE REPORTED IMMEDIATELY: *FEVER GREATER THAN 100.4 F (38 C) OR HIGHER *CHILLS OR SWEATING *NAUSEA AND VOMITING THAT IS NOT CONTROLLED WITH YOUR NAUSEA MEDICATION *UNUSUAL SHORTNESS OF BREATH *UNUSUAL BRUISING OR BLEEDING *URINARY PROBLEMS (pain or burning when urinating, or frequent urination) *BOWEL PROBLEMS (unusual diarrhea, constipation, pain near the anus) TENDERNESS IN MOUTH AND THROAT WITH OR WITHOUT PRESENCE OF ULCERS (sore throat, sores in mouth, or a toothache) UNUSUAL RASH, SWELLING OR PAIN  UNUSUAL VAGINAL DISCHARGE OR ITCHING   Items with * indicate a potential emergency and should be followed up as soon as possible or go to the Emergency Department if any problems should occur.  Please show the CHEMOTHERAPY ALERT CARD or IMMUNOTHERAPY ALERT CARD at check-in  to the Emergency Department and triage nurse.  Should you have questions after your visit or need to cancel or reschedule your appointment, please contact [facility]  Dept: [phone]  and follow the prompts.  Office hours are 8:00 a.m. to 4:30 p.m. Monday - Friday. Please note that voicemails left after 4:00 p.m. may not be returned until the following business day.  We are closed weekends and major holidays. You have access to a nurse at all times for urgent questions. Please call the main number to the clinic Dept: [phone] and follow the prompts.   For any non-urgent questions, you may also contact your provider using MyChart. We now offer e-Visits for anyone 65 and older to request care online for non-urgent symptoms. For details visit [email or web address].   Also download the MyChart app! Go to the app store, search "MyChart", open the app, select [facility], and log in with your MyChart username and password.  Due to Covid, a mask is required upon entering the hospital/clinic. If you do not have a mask, one will be given to you upon arrival. For doctor visits, patients may have 1 support person aged 39 or older with them. For treatment visits, patients cannot have anyone with them due to current Covid guidelines and our immunocompromised population.

## 2021-03-07 NOTE — Telephone Encounter (Signed)
Scheduled per 09/16 los, patient has been called and notified.  

## 2021-03-14 DIAGNOSIS — E039 Hypothyroidism, unspecified: Secondary | ICD-10-CM | POA: Diagnosis not present

## 2021-03-14 DIAGNOSIS — E7800 Pure hypercholesterolemia, unspecified: Secondary | ICD-10-CM | POA: Diagnosis not present

## 2021-03-14 DIAGNOSIS — I10 Essential (primary) hypertension: Secondary | ICD-10-CM | POA: Diagnosis not present

## 2021-03-19 MED FILL — Fosaprepitant Dimeglumine For IV Infusion 150 MG (Base Eq): INTRAVENOUS | Qty: 5 | Status: AC

## 2021-03-19 MED FILL — Dexamethasone Sodium Phosphate Inj 100 MG/10ML: INTRAMUSCULAR | Qty: 1 | Status: AC

## 2021-03-20 ENCOUNTER — Other Ambulatory Visit: Payer: Self-pay

## 2021-03-20 ENCOUNTER — Inpatient Hospital Stay: Payer: Medicare Other | Attending: Oncology

## 2021-03-20 ENCOUNTER — Inpatient Hospital Stay: Payer: Medicare Other

## 2021-03-20 ENCOUNTER — Inpatient Hospital Stay (HOSPITAL_BASED_OUTPATIENT_CLINIC_OR_DEPARTMENT_OTHER): Payer: Medicare Other | Admitting: Oncology

## 2021-03-20 VITALS — BP 178/77 | HR 81 | Temp 97.7°F | Resp 19 | Ht 71.0 in | Wt 261.9 lb

## 2021-03-20 DIAGNOSIS — C679 Malignant neoplasm of bladder, unspecified: Secondary | ICD-10-CM | POA: Diagnosis not present

## 2021-03-20 DIAGNOSIS — Z5111 Encounter for antineoplastic chemotherapy: Secondary | ICD-10-CM | POA: Diagnosis not present

## 2021-03-20 DIAGNOSIS — C787 Secondary malignant neoplasm of liver and intrahepatic bile duct: Secondary | ICD-10-CM | POA: Diagnosis not present

## 2021-03-20 DIAGNOSIS — Z95828 Presence of other vascular implants and grafts: Secondary | ICD-10-CM

## 2021-03-20 LAB — CBC WITH DIFFERENTIAL (CANCER CENTER ONLY)
Abs Immature Granulocytes: 0.01 10*3/uL (ref 0.00–0.07)
Basophils Absolute: 0 10*3/uL (ref 0.0–0.1)
Basophils Relative: 1 %
Eosinophils Absolute: 0.1 10*3/uL (ref 0.0–0.5)
Eosinophils Relative: 4 %
HCT: 27.1 % — ABNORMAL LOW (ref 39.0–52.0)
Hemoglobin: 9.1 g/dL — ABNORMAL LOW (ref 13.0–17.0)
Immature Granulocytes: 0 %
Lymphocytes Relative: 26 %
Lymphs Abs: 0.7 10*3/uL (ref 0.7–4.0)
MCH: 30.8 pg (ref 26.0–34.0)
MCHC: 33.6 g/dL (ref 30.0–36.0)
MCV: 91.9 fL (ref 80.0–100.0)
Monocytes Absolute: 0.5 10*3/uL (ref 0.1–1.0)
Monocytes Relative: 18 %
Neutro Abs: 1.3 10*3/uL — ABNORMAL LOW (ref 1.7–7.7)
Neutrophils Relative %: 51 %
Platelet Count: 103 10*3/uL — ABNORMAL LOW (ref 150–400)
RBC: 2.95 MIL/uL — ABNORMAL LOW (ref 4.22–5.81)
RDW: 21.2 % — ABNORMAL HIGH (ref 11.5–15.5)
WBC Count: 2.5 10*3/uL — ABNORMAL LOW (ref 4.0–10.5)
nRBC: 0 % (ref 0.0–0.2)

## 2021-03-20 LAB — CMP (CANCER CENTER ONLY)
ALT: 15 U/L (ref 0–44)
AST: 19 U/L (ref 15–41)
Albumin: 4.2 g/dL (ref 3.5–5.0)
Alkaline Phosphatase: 68 U/L (ref 38–126)
Anion gap: 10 (ref 5–15)
BUN: 12 mg/dL (ref 8–23)
CO2: 23 mmol/L (ref 22–32)
Calcium: 8.9 mg/dL (ref 8.9–10.3)
Chloride: 106 mmol/L (ref 98–111)
Creatinine: 0.8 mg/dL (ref 0.61–1.24)
GFR, Estimated: >60 mL/min (ref >60)
Glucose, Bld: 104 mg/dL — ABNORMAL HIGH (ref 70–99)
Potassium: 4.2 mmol/L (ref 3.5–5.1)
Sodium: 139 mmol/L (ref 135–145)
Total Bilirubin: 0.5 mg/dL (ref 0.3–1.2)
Total Protein: 7.1 g/dL (ref 6.5–8.1)

## 2021-03-20 LAB — MAGNESIUM: Magnesium: 1.6 mg/dL — ABNORMAL LOW (ref 1.7–2.4)

## 2021-03-20 MED ORDER — PALONOSETRON HCL INJECTION 0.25 MG/5ML
0.2500 mg | Freq: Once | INTRAVENOUS | Status: AC
  Administered 2021-03-20: 0.25 mg via INTRAVENOUS

## 2021-03-20 MED ORDER — SODIUM CHLORIDE 0.9% FLUSH
10.0000 mL | INTRAVENOUS | Status: DC | PRN
  Administered 2021-03-20: 10 mL

## 2021-03-20 MED ORDER — SODIUM CHLORIDE 0.9 % IV SOLN: Freq: Once | INTRAVENOUS | Status: DC

## 2021-03-20 MED ORDER — DEXAMETHASONE SODIUM PHOSPHATE 100 MG/10ML IJ SOLN
10.0000 mg | Freq: Once | INTRAMUSCULAR | Status: AC
  Administered 2021-03-20: 10 mg via INTRAVENOUS
  Filled 2021-03-20: qty 10

## 2021-03-20 MED ORDER — LORAZEPAM 1 MG PO TABS
ORAL_TABLET | ORAL | Status: AC
  Filled 2021-03-20: qty 1

## 2021-03-20 MED ORDER — MAGNESIUM SULFATE 2 GM/50ML IV SOLN
INTRAVENOUS | Status: AC
  Filled 2021-03-20: qty 50

## 2021-03-20 MED ORDER — SODIUM CHLORIDE 0.9 % IV SOLN
70.0000 mg/m2 | Freq: Once | INTRAVENOUS | Status: AC
  Administered 2021-03-20: 174 mg via INTRAVENOUS
  Filled 2021-03-20: qty 174

## 2021-03-20 MED ORDER — LORAZEPAM 1 MG PO TABS
1.0000 mg | ORAL_TABLET | Freq: Once | ORAL | Status: AC
  Administered 2021-03-20: 1 mg via ORAL

## 2021-03-20 MED ORDER — MAGNESIUM SULFATE 2 GM/50ML IV SOLN
2.0000 g | Freq: Once | INTRAVENOUS | Status: AC
  Administered 2021-03-20: 2 g via INTRAVENOUS

## 2021-03-20 MED ORDER — POTASSIUM CHLORIDE IN NACL 20-0.9 MEQ/L-% IV SOLN
Freq: Once | INTRAVENOUS | Status: AC
  Filled 2021-03-20: qty 1000

## 2021-03-20 MED ORDER — HEPARIN SOD (PORK) LOCK FLUSH 100 UNIT/ML IV SOLN
500.0000 [IU] | Freq: Once | INTRAVENOUS | Status: AC | PRN
  Administered 2021-03-20: 500 [IU]

## 2021-03-20 MED ORDER — SODIUM CHLORIDE 0.9 % IV SOLN: Freq: Once | INTRAVENOUS | Status: AC

## 2021-03-20 MED ORDER — SODIUM CHLORIDE 0.9% FLUSH
10.0000 mL | Freq: Once | INTRAVENOUS | Status: AC
  Administered 2021-03-20: 10 mL

## 2021-03-20 MED ORDER — SODIUM CHLORIDE 0.9 % IV SOLN
800.0000 mg/m2 | Freq: Once | INTRAVENOUS | Status: AC
  Administered 2021-03-20: 1976 mg via INTRAVENOUS
  Filled 2021-03-20: qty 51.97

## 2021-03-20 MED ORDER — SODIUM CHLORIDE 0.9 % IV SOLN
150.0000 mg | Freq: Once | INTRAVENOUS | Status: AC
  Administered 2021-03-20: 150 mg via INTRAVENOUS
  Filled 2021-03-20: qty 150

## 2021-03-20 MED ORDER — PALONOSETRON HCL INJECTION 0.25 MG/5ML
INTRAVENOUS | Status: AC
  Filled 2021-03-20: qty 5

## 2021-03-20 NOTE — Progress Notes (Signed)
Hematology and Oncology Follow Up Visit  Javier Sellers [id number] 1950/12/22 70 y.o. 03/20/2021 8:11 AM Javier Sellers, MDRedding, Javier Blonder, MD   Principle Diagnosis: 71 year old man with stage IV high-grade urothelial carcinoma of the bladder with small cell component presented with hepatic metastasis in June 2022.  Secondary diagnosis: Left lung neoplasm consistent with non-small cell lung cancer diagnosed in July 2022.  Prior Therapy: He underwent TURBT on November 13, 2020 and the final pathology revealed high-grade urothelial carcinoma with small cell component of 70%.  Current therapy: Chemotherapy utilizing gemcitabine and cisplatin started on December 27, 2020.  He is here for day 1 of cycle 5 of therapy.  Interim History: Javier Sellers returns today for repeat evaluation.  Since the last visit, he reports no major changes in his health.  He did report some mild nausea but no vomiting associated with the last treatment.  He denies any shortness of breath or difficulty breathing.  He denies any worsening neuropathy.  His performance status and quality of life remained excellent.  He reports he has resumed all activities of daily living during his week off.     Medications: Updated on review. Current Outpatient Medications  Medication Sig Dispense Refill   amLODipine (NORVASC) 5 MG tablet Take 5 mg by mouth every morning.     diclofenac Sodium (VOLTAREN) 1 % GEL Apply 1 application topically daily as needed (knee pain).     lidocaine-prilocaine (EMLA) cream Apply 1 application topically as needed. 30 g 0   lisinopril (ZESTRIL) 40 MG tablet Take 40 mg by mouth every morning.     Menthol-Camphor (ICY HOT ADVANCED PAIN RELIEF) 16-11 % CREA Apply 1 application topically daily as needed (pain).     metoprolol succinate (TOPROL-XL) 100 MG 24 hr tablet Take 100 mg by mouth every morning.     pravastatin (PRAVACHOL) 80 MG tablet Take 80 mg by mouth at bedtime.     prochlorperazine (COMPAZINE) 10 MG  tablet Take 1 tablet (10 mg total) by mouth every 6 (six) hours as needed for nausea or vomiting. 30 tablet 0   tamsulosin (FLOMAX) 0.4 MG CAPS capsule Take 0.4 mg by mouth daily.     No current facility-administered medications for this visit.     Allergies: No Known Allergies   Physical Exam:  Blood pressure (!) 178/77, pulse 81, temperature 97.7 F (36.5 C), temperature source Tympanic, resp. rate 19, height 5\' 11"  (1.803 m), weight 261 lb 14.4 oz (118.8 kg), SpO2 100 %.    ECOG: 1   General appearance: Comfortable appearing without any discomfort Head: Normocephalic without any trauma Oropharynx: Mucous membranes are moist and pink without any thrush or ulcers. Eyes: Pupils are equal and round reactive to light. Lymph nodes: No cervical, supraclavicular, inguinal or axillary lymphadenopathy.   Heart:regular rate and rhythm.  S1 and S2 without leg edema. Lung: Clear without any rhonchi or wheezes.  No dullness to percussion. Abdomin: Soft, nontender, nondistended with good bowel sounds.  No hepatosplenomegaly. Musculoskeletal: No joint deformity or effusion.  Full range of motion noted. Neurological: No deficits noted on motor, sensory and deep tendon reflex exam. Skin: No petechial rash or dryness.  Appeared moist.       Lab Results: Lab Results  Component Value Date   WBC 3.6 (L) 03/07/2021   HGB 10.6 (L) 03/07/2021   HCT 30.6 (L) 03/07/2021   MCV 86.7 03/07/2021   PLT 141 (L) 03/07/2021     Chemistry  Component Value Date/Time   NA 138 03/07/2021 0924   K 4.1 03/07/2021 0924   CL 102 03/07/2021 0924   CO2 23 03/07/2021 0924   BUN 17 03/07/2021 0924   CREATININE 0.85 03/07/2021 0924      Component Value Date/Time   CALCIUM 9.8 03/07/2021 0924   ALKPHOS 76 03/07/2021 0924   AST 32 03/07/2021 0924   ALT 42 03/07/2021 0924   BILITOT 0.5 03/07/2021 0924          Impression and Plan:  70 year old man with:  1.  Bladder cancer diagnosed with  metastatic disease in June 2022.  He was found to have stage IV high-grade urothelial carcinoma with small cell differentiation.   His disease status was updated at this time and treatment choices were reviewed.  Complication associated with current chemotherapy that include nausea, vomiting, myelosuppression, neutropenia among others were reviewed.  The plan is to complete 6 cycles of therapy before switching to maintenance immunotherapy.  He is agreeable to proceed at this time.       2.  IV access: Port-A-Cath remains in use without any issues.   3.  Antiemetics: Compazine is available to him without any vomiting.  Nausea symptoms are manageable at this time.   4.  Renal function surveillance: His creatinine remains at baseline and will continue to monitor on platinum therapy.   5.  Goals of care: Therapy remains palliative although aggressive measures are warranted given excellent performance status.   6.  Lung neoplasm: Had an excellent response to current therapy without any additional treatment needed given his bladder neoplasm.  7.  Neutropenia: Related to chemotherapy and will consider growth factor support in the future.  His absolute neutrophil count is adequate to proceed with treatment today.  We will adjust gemcitabine dosing at this time.  He also understands that day 8 of chemotherapy may be withheld if neutropenia persists.   8.  Follow-up: He will return in 1 week to complete current cycle in 3 weeks for start of cycle 6.   30  minutes were dedicated to this visit.  The time spent on reviewing laboratory data, disease status update, treatment choices and future plan of care discussion.     Javier Button, MD 10/6/20228:11 AM

## 2021-03-20 NOTE — Patient Instructions (Signed)
[facility] ONCOLOG[facility]  Discharge Instructions: Thank you for choosing [facility] to provide your oncology and hematology care.   If you have a lab appointment with the [facility], please go directly to the [facility] and check in at the registration area.   Wear comfortable clothing and clothing appropriate for easy access to any Portacath or PICC line.   We strive to give you quality time with your provider. You may need to reschedule your appointment if you arrive late (15 or more minutes).  Arriving late affects you and other patients whose appointments are after yours.  Also, if you miss three or more appointments without notifying the office, you may be dismissed from the clinic at the provider's discretion.      For prescription refill requests, have your pharmacy contact our office and allow 72 hours for refills to be completed.    Today you received the following chemotherapy and/or immunotherapy agents: Gemcitabine (Gemzar) and Cisplatin.   To help prevent nausea and vomiting after your treatment, we encourage you to take your nausea medication as directed.  BELOW ARE SYMPTOMS THAT SHOULD BE REPORTED IMMEDIATELY: *FEVER GREATER THAN 100.4 F (38 C) OR HIGHER *CHILLS OR SWEATING *NAUSEA AND VOMITING THAT IS NOT CONTROLLED WITH YOUR NAUSEA MEDICATION *UNUSUAL SHORTNESS OF BREATH *UNUSUAL BRUISING OR BLEEDING *URINARY PROBLEMS (pain or burning when urinating, or frequent urination) *BOWEL PROBLEMS (unusual diarrhea, constipation, pain near the anus) TENDERNESS IN MOUTH AND THROAT WITH OR WITHOUT PRESENCE OF ULCERS (sore throat, sores in mouth, or a toothache) UNUSUAL RASH, SWELLING OR PAIN  UNUSUAL VAGINAL DISCHARGE OR ITCHING   Items with * indicate a potential emergency and should be followed up as soon as possible or go to the Emergency Department if any problems should occur.  Please show the CHEMOTHERAPY ALERT CARD or IMMUNOTHERAPY  ALERT CARD at check-in to the Emergency Department and triage nurse.  Should you have questions after your visit or need to cancel or reschedule your appointment, please contact [facility]  Dept: [phone]  and follow the prompts.  Office hours are 8:00 a.m. to 4:30 p.m. Monday - Friday. Please note that voicemails left after 4:00 p.m. may not be returned until the following business day.  We are closed weekends and major holidays. You have access to a nurse at all times for urgent questions. Please call the main number to the clinic Dept: [phone] and follow the prompts.   For any non-urgent questions, you may also contact your provider using MyChart. We now offer e-Visits for anyone 35 and older to request care online for non-urgent symptoms. For details visit [email or web address].   Also download the MyChart app! Go to the app store, search "MyChart", open the app, select [facility], and log in with your MyChart username and password.  Due to Covid, a mask is required upon entering the hospital/clinic. If you do not have a mask, one will be given to you upon arrival. For doctor visits, patients may have 1 support person aged 35 or older with them. For treatment visits, patients cannot have anyone with them due to current Covid guidelines and our immunocompromised population.

## 2021-03-20 NOTE — Progress Notes (Signed)
Pt. complained of nausea and emesis. Pt. states nausea started when he first arrived here, emesis started after Decadron completed. Dr. Alen Blew notified and new orders received. Ativan 1 mg given po. Therapeutic effects received from Ativan.

## 2021-03-28 ENCOUNTER — Encounter: Payer: Medicare Other | Admitting: Dietician

## 2021-03-28 ENCOUNTER — Other Ambulatory Visit: Payer: Medicare Other

## 2021-03-28 ENCOUNTER — Ambulatory Visit: Payer: Medicare Other

## 2021-04-10 MED FILL — Dexamethasone Sodium Phosphate Inj 100 MG/10ML: INTRAMUSCULAR | Qty: 1 | Status: AC

## 2021-04-10 MED FILL — Fosaprepitant Dimeglumine For IV Infusion 150 MG (Base Eq): INTRAVENOUS | Qty: 5 | Status: AC

## 2021-04-11 ENCOUNTER — Inpatient Hospital Stay (HOSPITAL_BASED_OUTPATIENT_CLINIC_OR_DEPARTMENT_OTHER): Payer: Medicare Other | Admitting: Oncology

## 2021-04-11 ENCOUNTER — Other Ambulatory Visit: Payer: Self-pay

## 2021-04-11 ENCOUNTER — Inpatient Hospital Stay: Payer: Medicare Other

## 2021-04-11 VITALS — BP 160/81 | HR 80 | Temp 96.8°F | Resp 17 | Wt 258.7 lb

## 2021-04-11 DIAGNOSIS — C787 Secondary malignant neoplasm of liver and intrahepatic bile duct: Secondary | ICD-10-CM | POA: Diagnosis not present

## 2021-04-11 DIAGNOSIS — Z95828 Presence of other vascular implants and grafts: Secondary | ICD-10-CM

## 2021-04-11 DIAGNOSIS — C679 Malignant neoplasm of bladder, unspecified: Secondary | ICD-10-CM

## 2021-04-11 DIAGNOSIS — Z5111 Encounter for antineoplastic chemotherapy: Secondary | ICD-10-CM | POA: Diagnosis not present

## 2021-04-11 LAB — CMP (CANCER CENTER ONLY)
ALT: 14 U/L (ref 0–44)
AST: 18 U/L (ref 15–41)
Albumin: 4 g/dL (ref 3.5–5.0)
Alkaline Phosphatase: 72 U/L (ref 38–126)
Anion gap: 11 (ref 5–15)
BUN: 15 mg/dL (ref 8–23)
CO2: 24 mmol/L (ref 22–32)
Calcium: 9.1 mg/dL (ref 8.9–10.3)
Chloride: 103 mmol/L (ref 98–111)
Creatinine: 0.91 mg/dL (ref 0.61–1.24)
GFR, Estimated: >60 mL/min (ref >60)
Glucose, Bld: 168 mg/dL — ABNORMAL HIGH (ref 70–99)
Potassium: 3.7 mmol/L (ref 3.5–5.1)
Sodium: 138 mmol/L (ref 135–145)
Total Bilirubin: 0.5 mg/dL (ref 0.3–1.2)
Total Protein: 7.3 g/dL (ref 6.5–8.1)

## 2021-04-11 LAB — CBC WITH DIFFERENTIAL (CANCER CENTER ONLY)
Abs Immature Granulocytes: 0.02 10*3/uL (ref 0.00–0.07)
Basophils Absolute: 0 10*3/uL (ref 0.0–0.1)
Basophils Relative: 1 %
Eosinophils Absolute: 0.2 10*3/uL (ref 0.0–0.5)
Eosinophils Relative: 4 %
HCT: 29.6 % — ABNORMAL LOW (ref 39.0–52.0)
Hemoglobin: 9.9 g/dL — ABNORMAL LOW (ref 13.0–17.0)
Immature Granulocytes: 1 %
Lymphocytes Relative: 19 %
Lymphs Abs: 0.8 10*3/uL (ref 0.7–4.0)
MCH: 31.5 pg (ref 26.0–34.0)
MCHC: 33.4 g/dL (ref 30.0–36.0)
MCV: 94.3 fL (ref 80.0–100.0)
Monocytes Absolute: 0.5 10*3/uL (ref 0.1–1.0)
Monocytes Relative: 12 %
Neutro Abs: 2.8 10*3/uL (ref 1.7–7.7)
Neutrophils Relative %: 63 %
Platelet Count: 124 10*3/uL — ABNORMAL LOW (ref 150–400)
RBC: 3.14 MIL/uL — ABNORMAL LOW (ref 4.22–5.81)
RDW: 17.4 % — ABNORMAL HIGH (ref 11.5–15.5)
WBC Count: 4.3 10*3/uL (ref 4.0–10.5)
nRBC: 0 % (ref 0.0–0.2)

## 2021-04-11 LAB — MAGNESIUM: Magnesium: 1.4 mg/dL — ABNORMAL LOW (ref 1.7–2.4)

## 2021-04-11 MED ORDER — SODIUM CHLORIDE 0.9 % IV SOLN
10.0000 mg | Freq: Once | INTRAVENOUS | Status: AC
  Administered 2021-04-11: 10 mg via INTRAVENOUS
  Filled 2021-04-11: qty 10

## 2021-04-11 MED ORDER — SODIUM CHLORIDE 0.9 % IV SOLN
150.0000 mg | Freq: Once | INTRAVENOUS | Status: AC
  Administered 2021-04-11: 150 mg via INTRAVENOUS
  Filled 2021-04-11: qty 150

## 2021-04-11 MED ORDER — SODIUM CHLORIDE 0.9 % IV SOLN: Freq: Once | INTRAVENOUS | Status: AC

## 2021-04-11 MED ORDER — SODIUM CHLORIDE 0.9% FLUSH
10.0000 mL | Freq: Once | INTRAVENOUS | Status: AC
  Administered 2021-04-11: 10 mL

## 2021-04-11 MED ORDER — SODIUM CHLORIDE 0.9% FLUSH
10.0000 mL | INTRAVENOUS | Status: DC | PRN
  Administered 2021-04-11: 10 mL

## 2021-04-11 MED ORDER — SODIUM CHLORIDE 0.9 % IV SOLN
70.0000 mg/m2 | Freq: Once | INTRAVENOUS | Status: AC
  Administered 2021-04-11: 174 mg via INTRAVENOUS
  Filled 2021-04-11: qty 174

## 2021-04-11 MED ORDER — MAGNESIUM SULFATE 2 GM/50ML IV SOLN
2.0000 g | Freq: Once | INTRAVENOUS | Status: AC
  Administered 2021-04-11: 2 g via INTRAVENOUS
  Filled 2021-04-11: qty 50

## 2021-04-11 MED ORDER — HEPARIN SOD (PORK) LOCK FLUSH 100 UNIT/ML IV SOLN
500.0000 [IU] | Freq: Once | INTRAVENOUS | Status: AC | PRN
  Administered 2021-04-11: 500 [IU]

## 2021-04-11 MED ORDER — PALONOSETRON HCL INJECTION 0.25 MG/5ML
0.2500 mg | Freq: Once | INTRAVENOUS | Status: AC
  Administered 2021-04-11: 0.25 mg via INTRAVENOUS
  Filled 2021-04-11: qty 5

## 2021-04-11 MED ORDER — SODIUM CHLORIDE 0.9 % IV SOLN
800.0000 mg/m2 | Freq: Once | INTRAVENOUS | Status: AC
  Administered 2021-04-11: 1976 mg via INTRAVENOUS
  Filled 2021-04-11: qty 51.97

## 2021-04-11 MED ORDER — LORAZEPAM 2 MG/ML IJ SOLN
1.0000 mg | Freq: Once | INTRAMUSCULAR | Status: AC
  Administered 2021-04-11: 1 mg via INTRAVENOUS
  Filled 2021-04-11: qty 1

## 2021-04-11 MED ORDER — POTASSIUM CHLORIDE IN NACL 20-0.9 MEQ/L-% IV SOLN
Freq: Once | INTRAVENOUS | Status: AC
  Filled 2021-04-11: qty 1000

## 2021-04-11 NOTE — Patient Instructions (Signed)
[facility]  Discharge Instructions: Thank you for choosing [facility] to provide your oncology and hematology care.   If you have a lab appointment with the [facility], please go directly to the [facility] and check in at the registration area.   Wear comfortable clothing and clothing appropriate for easy access to any Portacath or PICC line.   We strive to give you quality time with your provider. You may need to reschedule your appointment if you arrive late (15 or more minutes).  Arriving late affects you and other patients whose appointments are after yours.  Also, if you miss three or more appointments without notifying the office, you may be dismissed from the clinic at the provider's discretion.      For prescription refill requests, have your pharmacy contact our office and allow 72 hours for refills to be completed.    Today you received the following chemotherapy and/or immunotherapy agents gemzar, cisplatin      To help prevent nausea and vomiting after your treatment, we encourage you to take your nausea medication as directed.  BELOW ARE SYMPTOMS THAT SHOULD BE REPORTED IMMEDIATELY: *FEVER GREATER THAN 100.4 F (38 C) OR HIGHER *CHILLS OR SWEATING *NAUSEA AND VOMITING THAT IS NOT CONTROLLED WITH YOUR NAUSEA MEDICATION *UNUSUAL SHORTNESS OF BREATH *UNUSUAL BRUISING OR BLEEDING *URINARY PROBLEMS (pain or burning when urinating, or frequent urination) *BOWEL PROBLEMS (unusual diarrhea, constipation, pain near the anus) TENDERNESS IN MOUTH AND THROAT WITH OR WITHOUT PRESENCE OF ULCERS (sore throat, sores in mouth, or a toothache) UNUSUAL RASH, SWELLING OR PAIN  UNUSUAL VAGINAL DISCHARGE OR ITCHING   Items with * indicate a potential emergency and should be followed up as soon as possible or go to the Emergency Department if any problems should occur.  Please show the CHEMOTHERAPY ALERT CARD or IMMUNOTHERAPY ALERT CARD at  check-in to the Emergency Department and triage nurse.  Should you have questions after your visit or need to cancel or reschedule your appointment, please contact [facility]  Dept: [phone]  and follow the prompts.  Office hours are 8:00 a.m. to 4:30 p.m. Monday - Friday. Please note that voicemails left after 4:00 p.m. may not be returned until the following business day.  We are closed weekends and major holidays. You have access to a nurse at all times for urgent questions. Please call the main number to the clinic Dept: [phone] and follow the prompts.   For any non-urgent questions, you may also contact your provider using MyChart. We now offer e-Visits for anyone 35 and older to request care online for non-urgent symptoms. For details visit [email or web address].   Also download the MyChart app! Go to the app store, search "MyChart", open the app, select [facility], and log in with your MyChart username and password.  Due to Covid, a mask is required upon entering the hospital/clinic. If you do not have a mask, one will be given to you upon arrival. For doctor visits, patients may have 1 support person aged 28 or older with them. For treatment visits, patients cannot have anyone with them due to current Covid guidelines and our immunocompromised population.

## 2021-04-11 NOTE — Progress Notes (Signed)
Hematology and Oncology Follow Up Visit  Javier Sellers [id number] 1951/02/04 70 y.o. 04/11/2021 8:11 AM Javier Sellers, MDRedding, Javier Blonder, MD   Principle Diagnosis: 70 year old man with bladder cancer diagnosed in June 2022.  He presented with stage IV high-grade urothelial carcinoma with hepatic metastasis.  Secondary diagnosis: Left lung neoplasm consistent with non-small cell lung cancer diagnosed in July 2022.  Prior Therapy: He underwent TURBT on November 13, 2020 and the final pathology revealed high-grade urothelial carcinoma with small cell component of 70%.  Current therapy: Chemotherapy utilizing gemcitabine and cisplatin started on December 27, 2020.  He is here for day 1 of cycle 6 of therapy.  Interim History: Javier Sellers presents today for a follow-up visit.  Since last visit, he reports feeling reasonably well without any major complaints.  He did have some fatigue and tiredness and nausea after the completion of day 1 cycle 5.  The symptoms have resolved at this time.  He is eating better and denies any shortness of breath or difficulty breathing.     Medications: Reviewed without changes. Current Outpatient Medications  Medication Sig Dispense Refill   amLODipine (NORVASC) 5 MG tablet Take 5 mg by mouth every morning.     diclofenac Sodium (VOLTAREN) 1 % GEL Apply 1 application topically daily as needed (knee pain).     lidocaine-prilocaine (EMLA) cream Apply 1 application topically as needed. 30 g 0   lisinopril (ZESTRIL) 40 MG tablet Take 40 mg by mouth every morning.     Menthol-Camphor (ICY HOT ADVANCED PAIN RELIEF) 16-11 % CREA Apply 1 application topically daily as needed (pain).     metoprolol succinate (TOPROL-XL) 100 MG 24 hr tablet Take 100 mg by mouth every morning.     pravastatin (PRAVACHOL) 80 MG tablet Take 80 mg by mouth at bedtime.     prochlorperazine (COMPAZINE) 10 MG tablet Take 1 tablet (10 mg total) by mouth every 6 (six) hours as needed for nausea or  vomiting. 30 tablet 0   tamsulosin (FLOMAX) 0.4 MG CAPS capsule Take 0.4 mg by mouth daily.     No current facility-administered medications for this visit.     Allergies: No Known Allergies   Physical Exam:  Blood pressure (!) 160/81, pulse 80, temperature (!) 96.8 F (36 C), temperature source Tympanic, resp. rate 17, weight 258 lb 11.2 oz (117.3 kg), SpO2 97 %.     ECOG: 1   General appearance: Alert, awake without any distress. Head: Atraumatic without abnormalities Oropharynx: Without any thrush or ulcers. Eyes: No scleral icterus. Lymph nodes: No lymphadenopathy noted in the cervical, supraclavicular, or axillary nodes Heart:regular rate and rhythm, without any murmurs or gallops.   Lung: Clear to auscultation without any rhonchi, wheezes or dullness to percussion. Abdomin: Soft, nontender without any shifting dullness or ascites. Musculoskeletal: No clubbing or cyanosis. Neurological: No motor or sensory deficits. Skin: No rashes or lesions.       Lab Results: Lab Results  Component Value Date   WBC 2.5 (L) 03/20/2021   HGB 9.1 (L) 03/20/2021   HCT 27.1 (L) 03/20/2021   MCV 91.9 03/20/2021   PLT 103 (L) 03/20/2021     Chemistry      Component Value Date/Time   NA 139 03/20/2021 0820   K 4.2 03/20/2021 0820   CL 106 03/20/2021 0820   CO2 23 03/20/2021 0820   BUN 12 03/20/2021 0820   CREATININE 0.80 03/20/2021 0820      Component Value  Date/Time   CALCIUM 8.9 03/20/2021 0820   ALKPHOS 68 03/20/2021 0820   AST 19 03/20/2021 0820   ALT 15 03/20/2021 0820   BILITOT 0.5 03/20/2021 0820          Impression and Plan:  70 year old man with:  1.  Stage IV high-grade urothelial carcinoma of the bladder with small cell differentiation.   He is currently on chemotherapy which she has tolerated reasonably well with reasonable response to therapy.  Risks and benefits of proceeding with cycle 6 of chemotherapy today were discussed.  Complications  include neutropenia, sepsis and worsening renal failure among others were reviewed.  The plan is to update his staging scans after the sixth cycle and switch to immunotherapy maintenance.  He is agreeable to proceed at this time.       2.  IV access: Port-A-Cath continues to be in use at this time without any issues.   3.  Antiemetics: No nausea or vomiting reported at this time.  Compazine is available to him.   4.  Renal function surveillance: Kidney function remained normal and will continue to monitor on platinum therapy.   5.  Goals of care: His disease is incurable although aggressive measures are warranted.   6.  Lung neoplasm: We will continue to monitor on future scans.  7.  Neutropenia: Related to chemotherapy with risk of fevers and infection.  Will consider growth factor support after chemotherapy adjustment.   8.  Follow-up: In 3 weeks for the start of maintenance therapy.   30  minutes were spent on this encounter.  The time was spent on reviewing laboratory data, disease status update and outlining future plan of care.       Javier Button, MD 10/28/20228:11 AM

## 2021-04-11 NOTE — Progress Notes (Signed)
DISCONTINUE ON PATHWAY REGIMEN - Bladder     A cycle is every 21 days:     Gemcitabine      Cisplatin   **Always confirm dose/schedule in your pharmacy ordering system**  REASON: Other Reason PRIOR TREATMENT: [id number]: Gemcitabine 1,000 mg/m2 D1, 8 + Cisplatin 70 mg/m2 D1 q21 Days x 4 Cycles TREATMENT RESPONSE: Partial Response (PR)  START OFF PATHWAY REGIMEN - Bladder   [id number]:Nivolumab 480 mg IV D1 q28 Days:   A cycle is every 28 days:     Nivolumab   **Always confirm dose/schedule in your pharmacy ordering system**  Patient Characteristics: Advanced/Metastatic Disease, Maintenance Therapy Therapeutic Status: Advanced/Metastatic Disease Line of Therapy: Maintenance Therapy  Intent of Therapy: Non-Curative / Palliative Intent, Discussed with Patient

## 2021-04-14 DIAGNOSIS — I10 Essential (primary) hypertension: Secondary | ICD-10-CM | POA: Diagnosis not present

## 2021-04-14 DIAGNOSIS — E039 Hypothyroidism, unspecified: Secondary | ICD-10-CM | POA: Diagnosis not present

## 2021-04-14 DIAGNOSIS — E7800 Pure hypercholesterolemia, unspecified: Secondary | ICD-10-CM | POA: Diagnosis not present

## 2021-04-18 ENCOUNTER — Inpatient Hospital Stay: Payer: Medicare Other

## 2021-04-18 ENCOUNTER — Inpatient Hospital Stay: Payer: Medicare Other | Admitting: Dietician

## 2021-04-18 ENCOUNTER — Inpatient Hospital Stay: Payer: Medicare Other | Attending: Oncology

## 2021-04-18 ENCOUNTER — Other Ambulatory Visit: Payer: Self-pay

## 2021-04-18 DIAGNOSIS — Z5112 Encounter for antineoplastic immunotherapy: Secondary | ICD-10-CM | POA: Diagnosis not present

## 2021-04-18 DIAGNOSIS — Z95828 Presence of other vascular implants and grafts: Secondary | ICD-10-CM

## 2021-04-18 DIAGNOSIS — C679 Malignant neoplasm of bladder, unspecified: Secondary | ICD-10-CM | POA: Insufficient documentation

## 2021-04-18 DIAGNOSIS — C787 Secondary malignant neoplasm of liver and intrahepatic bile duct: Secondary | ICD-10-CM | POA: Insufficient documentation

## 2021-04-18 LAB — CBC WITH DIFFERENTIAL (CANCER CENTER ONLY)
Abs Immature Granulocytes: 0.02 10*3/uL (ref 0.00–0.07)
Basophils Absolute: 0 10*3/uL (ref 0.0–0.1)
Basophils Relative: 1 %
Eosinophils Absolute: 0 10*3/uL (ref 0.0–0.5)
Eosinophils Relative: 1 %
HCT: 26.5 % — ABNORMAL LOW (ref 39.0–52.0)
Hemoglobin: 9 g/dL — ABNORMAL LOW (ref 13.0–17.0)
Immature Granulocytes: 1 %
Lymphocytes Relative: 24 %
Lymphs Abs: 0.7 10*3/uL (ref 0.7–4.0)
MCH: 31 pg (ref 26.0–34.0)
MCHC: 34 g/dL (ref 30.0–36.0)
MCV: 91.4 fL (ref 80.0–100.0)
Monocytes Absolute: 0.2 10*3/uL (ref 0.1–1.0)
Monocytes Relative: 5 %
Neutro Abs: 1.9 10*3/uL (ref 1.7–7.7)
Neutrophils Relative %: 68 %
Platelet Count: 65 10*3/uL — ABNORMAL LOW (ref 150–400)
RBC: 2.9 MIL/uL — ABNORMAL LOW (ref 4.22–5.81)
RDW: 14.5 % (ref 11.5–15.5)
WBC Count: 2.8 10*3/uL — ABNORMAL LOW (ref 4.0–10.5)
nRBC: 0 % (ref 0.0–0.2)

## 2021-04-18 LAB — CMP (CANCER CENTER ONLY)
ALT: 38 U/L (ref 0–44)
AST: 34 U/L (ref 15–41)
Albumin: 4 g/dL (ref 3.5–5.0)
Alkaline Phosphatase: 73 U/L (ref 38–126)
Anion gap: 10 (ref 5–15)
BUN: 14 mg/dL (ref 8–23)
CO2: 25 mmol/L (ref 22–32)
Calcium: 8.3 mg/dL — ABNORMAL LOW (ref 8.9–10.3)
Chloride: 101 mmol/L (ref 98–111)
Creatinine: 0.85 mg/dL (ref 0.61–1.24)
GFR, Estimated: >60 mL/min (ref >60)
Glucose, Bld: 159 mg/dL — ABNORMAL HIGH (ref 70–99)
Potassium: 3.7 mmol/L (ref 3.5–5.1)
Sodium: 136 mmol/L (ref 135–145)
Total Bilirubin: 0.4 mg/dL (ref 0.3–1.2)
Total Protein: 7 g/dL (ref 6.5–8.1)

## 2021-04-18 LAB — MAGNESIUM: Magnesium: 1 mg/dL — ABNORMAL LOW (ref 1.7–2.4)

## 2021-04-18 MED ORDER — SODIUM CHLORIDE 0.9% FLUSH
10.0000 mL | Freq: Once | INTRAVENOUS | Status: AC
  Administered 2021-04-18: 10 mL

## 2021-04-30 ENCOUNTER — Telehealth: Payer: Self-pay | Admitting: Oncology

## 2021-04-30 NOTE — Telephone Encounter (Signed)
Scheduled per 10/28 los, patient has been called and notified of upcoming appointments.

## 2021-05-02 ENCOUNTER — Other Ambulatory Visit: Payer: Self-pay

## 2021-05-02 ENCOUNTER — Ambulatory Visit (HOSPITAL_BASED_OUTPATIENT_CLINIC_OR_DEPARTMENT_OTHER): Payer: Medicare Other | Admitting: Physician Assistant

## 2021-05-02 ENCOUNTER — Inpatient Hospital Stay (HOSPITAL_BASED_OUTPATIENT_CLINIC_OR_DEPARTMENT_OTHER): Payer: Medicare Other | Admitting: Oncology

## 2021-05-02 ENCOUNTER — Inpatient Hospital Stay: Payer: Medicare Other

## 2021-05-02 VITALS — BP 180/81 | HR 71 | Temp 97.2°F | Resp 18 | Wt 254.4 lb

## 2021-05-02 DIAGNOSIS — C679 Malignant neoplasm of bladder, unspecified: Secondary | ICD-10-CM

## 2021-05-02 DIAGNOSIS — C787 Secondary malignant neoplasm of liver and intrahepatic bile duct: Secondary | ICD-10-CM | POA: Diagnosis not present

## 2021-05-02 DIAGNOSIS — C678 Malignant neoplasm of overlapping sites of bladder: Secondary | ICD-10-CM

## 2021-05-02 DIAGNOSIS — R112 Nausea with vomiting, unspecified: Secondary | ICD-10-CM

## 2021-05-02 DIAGNOSIS — Z95828 Presence of other vascular implants and grafts: Secondary | ICD-10-CM

## 2021-05-02 DIAGNOSIS — Z5112 Encounter for antineoplastic immunotherapy: Secondary | ICD-10-CM | POA: Diagnosis not present

## 2021-05-02 LAB — CMP (CANCER CENTER ONLY)
ALT: 15 U/L (ref 0–44)
AST: 19 U/L (ref 15–41)
Albumin: 4.1 g/dL (ref 3.5–5.0)
Alkaline Phosphatase: 66 U/L (ref 38–126)
Anion gap: 10 (ref 5–15)
BUN: 13 mg/dL (ref 8–23)
CO2: 24 mmol/L (ref 22–32)
Calcium: 9.4 mg/dL (ref 8.9–10.3)
Chloride: 103 mmol/L (ref 98–111)
Creatinine: 1.04 mg/dL (ref 0.61–1.24)
GFR, Estimated: >60 mL/min (ref >60)
Glucose, Bld: 187 mg/dL — ABNORMAL HIGH (ref 70–99)
Potassium: 3.8 mmol/L (ref 3.5–5.1)
Sodium: 137 mmol/L (ref 135–145)
Total Bilirubin: 0.2 mg/dL — ABNORMAL LOW (ref 0.3–1.2)
Total Protein: 7.4 g/dL (ref 6.5–8.1)

## 2021-05-02 LAB — CBC WITH DIFFERENTIAL (CANCER CENTER ONLY)
Abs Immature Granulocytes: 0.01 10*3/uL (ref 0.00–0.07)
Basophils Absolute: 0 10*3/uL (ref 0.0–0.1)
Basophils Relative: 1 %
Eosinophils Absolute: 0.1 10*3/uL (ref 0.0–0.5)
Eosinophils Relative: 3 %
HCT: 28.5 % — ABNORMAL LOW (ref 39.0–52.0)
Hemoglobin: 9.8 g/dL — ABNORMAL LOW (ref 13.0–17.0)
Immature Granulocytes: 0 %
Lymphocytes Relative: 26 %
Lymphs Abs: 0.7 10*3/uL (ref 0.7–4.0)
MCH: 31.6 pg (ref 26.0–34.0)
MCHC: 34.4 g/dL (ref 30.0–36.0)
MCV: 91.9 fL (ref 80.0–100.0)
Monocytes Absolute: 0.3 10*3/uL (ref 0.1–1.0)
Monocytes Relative: 12 %
Neutro Abs: 1.6 10*3/uL — ABNORMAL LOW (ref 1.7–7.7)
Neutrophils Relative %: 58 %
Platelet Count: 157 10*3/uL (ref 150–400)
RBC: 3.1 MIL/uL — ABNORMAL LOW (ref 4.22–5.81)
RDW: 15.5 % (ref 11.5–15.5)
WBC Count: 2.7 10*3/uL — ABNORMAL LOW (ref 4.0–10.5)
nRBC: 0 % (ref 0.0–0.2)

## 2021-05-02 LAB — MAGNESIUM: Magnesium: 1.2 mg/dL — ABNORMAL LOW (ref 1.7–2.4)

## 2021-05-02 MED ORDER — HEPARIN SOD (PORK) LOCK FLUSH 100 UNIT/ML IV SOLN
500.0000 [IU] | Freq: Once | INTRAVENOUS | Status: AC | PRN
  Administered 2021-05-02: 500 [IU]

## 2021-05-02 MED ORDER — SODIUM CHLORIDE 0.9 % IV SOLN: 8.0000 mg | Freq: Once | INTRAVENOUS | Status: DC

## 2021-05-02 MED ORDER — SODIUM CHLORIDE 0.9 % IV SOLN
480.0000 mg | Freq: Once | INTRAVENOUS | Status: AC
  Administered 2021-05-02: 480 mg via INTRAVENOUS
  Filled 2021-05-02: qty 48

## 2021-05-02 MED ORDER — SODIUM CHLORIDE 0.9% FLUSH
10.0000 mL | Freq: Once | INTRAVENOUS | Status: AC
  Administered 2021-05-02: 10 mL

## 2021-05-02 MED ORDER — ONDANSETRON HCL 4 MG/2ML IJ SOLN
8.0000 mg | Freq: Once | INTRAMUSCULAR | Status: AC
  Administered 2021-05-02: 8 mg via INTRAVENOUS

## 2021-05-02 MED ORDER — SODIUM CHLORIDE 0.9% FLUSH
10.0000 mL | INTRAVENOUS | Status: DC | PRN
  Administered 2021-05-02: 10 mL

## 2021-05-02 MED ORDER — SODIUM CHLORIDE 0.9 % IV SOLN: Freq: Once | INTRAVENOUS | Status: DC

## 2021-05-02 NOTE — Progress Notes (Signed)
After starting the Opdivo the patient began to vomit violently. The infusion was stopped, IVF initiated and Katelyn PA at the bedside. Order received for Zofran. OPdivo was initiated about 30 min after zofran administration. Patient tolerated infusion. He felt the the nausea began during the port flush.

## 2021-05-02 NOTE — Progress Notes (Signed)
Hematology and Oncology Follow Up Visit  Javier Sellers [id number] May 20, 1951 70 y.o. 05/02/2021 9:43 AM Javier Sellers Javier Sellers, MDRedding, Javier Blonder, MD   Principle Diagnosis: 70 year old man with stage IV high-grade urothelial carcinoma of the bladder with hepatic metastasis diagnosed in June 2022.  Secondary diagnosis: Left lung neoplasm consistent with non-small cell lung cancer diagnosed in July 2022.  Prior Therapy: He underwent TURBT on November 13, 2020 and the final pathology revealed high-grade urothelial carcinoma with small cell component of 70%.  Chemotherapy utilizing gemcitabine and cisplatin started on December 27, 2020.  He completed 6 cycles of therapy and October 2022.  Current therapy: Under evaluation to start monthly nivolumab 480 mg every 4 weeks.  Cycle 1 scheduled to start on May 02, 2021  Interim History: Javier Sellers returns today for repeat evaluation.  Since the last visit, he reports no major changes in his health.  He has tolerated chemotherapy without any residual complaints.  He denies any nausea, vomiting or abdominal pain but did report residual fatigue.  He is eating well and continues to attend to activities of daily living.     Medications: Updated today. Current Outpatient Medications  Medication Sig Dispense Refill   amLODipine (NORVASC) 5 MG tablet Take 5 mg by mouth every morning.     diclofenac Sodium (VOLTAREN) 1 % GEL Apply 1 application topically daily as needed (knee pain).     lidocaine-prilocaine (EMLA) cream Apply 1 application topically as needed. 30 g 0   lisinopril (ZESTRIL) 40 MG tablet Take 40 mg by mouth every morning.     Menthol-Camphor (ICY HOT ADVANCED PAIN RELIEF) 16-11 % CREA Apply 1 application topically daily as needed (pain).     metoprolol succinate (TOPROL-XL) 100 MG 24 hr tablet Take 100 mg by mouth every morning.     pravastatin (PRAVACHOL) 80 MG tablet Take 80 mg by mouth at bedtime.     prochlorperazine (COMPAZINE) 10 MG tablet  Take 1 tablet (10 mg total) by mouth every 6 (six) hours as needed for nausea or vomiting. 30 tablet 0   tamsulosin (FLOMAX) 0.4 MG CAPS capsule Take 0.4 mg by mouth daily.     No current facility-administered medications for this visit.     Allergies: No Known Allergies   Physical Exam:   Blood pressure (!) 180/81, pulse 71, temperature (!) 97.2 F (36.2 C), temperature source Tympanic, resp. rate 18, weight 254 lb 7 oz (115.4 kg), SpO2 100 %.     ECOG: 1    General appearance: Comfortable appearing without any discomfort Head: Normocephalic without any trauma Oropharynx: Mucous membranes are moist and pink without any thrush or ulcers. Eyes: Pupils are equal and round reactive to light. Lymph nodes: No cervical, supraclavicular, inguinal or axillary lymphadenopathy.   Heart:regular rate and rhythm.  S1 and S2 without leg edema. Lung: Clear without any rhonchi or wheezes.  No dullness to percussion. Abdomin: Soft, nontender, nondistended with good bowel sounds.  No hepatosplenomegaly. Musculoskeletal: No joint deformity or effusion.  Full range of motion noted. Neurological: No deficits noted on motor, sensory and deep tendon reflex exam. Skin: No petechial rash or dryness.  Appeared moist.         Lab Results: Lab Results  Component Value Date   WBC 2.8 (L) 04/18/2021   HGB 9.0 (L) 04/18/2021   HCT 26.5 (L) 04/18/2021   MCV 91.4 04/18/2021   PLT 65 (L) 04/18/2021     Chemistry  Component Value Date/Time   NA 136 04/18/2021 1240   K 3.7 04/18/2021 1240   CL 101 04/18/2021 1240   CO2 25 04/18/2021 1240   BUN 14 04/18/2021 1240   CREATININE 0.85 04/18/2021 1240      Component Value Date/Time   CALCIUM 8.3 (L) 04/18/2021 1240   ALKPHOS 73 04/18/2021 1240   AST 34 04/18/2021 1240   ALT 38 04/18/2021 1240   BILITOT 0.4 04/18/2021 1240          Impression and Plan:  70 year old man with:  1.  Bladder cancer diagnosed in June 2022.  He was  found to have stage IV high-grade urothelial carcinoma with small cell features.   He is status post 6 cycles of cisplatin based chemotherapy she has tolerated very well with excellent response based on imaging studies in September 2022.  Risks and benefits of switching to immunotherapy were reviewed at this time.  Complications associated with nivolumab include nausea, fatigue and immune mediated issues were reiterated.  The plan is to proceed with treatment today and update his staging scans before the next visit.  He is agreeable to proceed.       2.  IV access: Port-A-Cath will continue to be in use without any issues at this time.   3.  Antiemetics: Compazine is available to him without any nausea or vomiting.   4.  Renal function surveillance: He is kidney function remains within normal range.   5.  Goals of care: Therapy is palliative at this time although aggressive measures are warranted given his reasonable performance status.   6.  Lung neoplasm: No additional treatment is needed at this time given his advanced bladder malignancy.  We will continue to monitor on future studies.  7.  Neutropenia: Related to chemotherapy and will be improved with switch maintenance to immunotherapy.  His absolute neutrophil count is adequate today and does not require any additional growth factor support.  8.  Immune mediated complications: Continue to educate him about these issues including pneumonitis, colitis and thyroid disease.   9.  Follow-up: He will return in 4 weeks for the next cycle of therapy.   30  minutes were dedicated to this visit.  The time was spent on reviewing laboratory data, disease status update and outlining future plan of care.     Javier Button, MD 11/18/20229:43 AM

## 2021-05-02 NOTE — Progress Notes (Signed)
Called to infusion at 11 AM for patient having nausea and vomiting.  He had just started his first treatment of Opdivo, with only a few minutes in.  He admitted to feeling nauseous after getting his port flushed.  Vomiting did not start until just prior to my evaluation.  He had 5-6 episodes of nonbloody nonbilious emesis.  He denies any associate abdominal pain and has a nontender abdominal exam.  No peritoneal signs.  Patient given Zofran and symptoms resolved.  He did admit to feeling nausea with last 3 port flushes as well.  Recommend patient try taking Compazine prior to next treatment.  Oncologist Dr. Alen Blew made aware of patient's symptoms.  He agrees with plan to continue treatment.  Patient tolerated remaining treatment without difficulty and was monitored by RN.

## 2021-05-02 NOTE — Patient Instructions (Signed)
[facility]  Discharge Instructions: Thank you for choosing [facility] to provide your oncology and hematology care.   If you have a lab appointment with the [facility], please go directly to the [facility] and check in at the registration area.   Wear comfortable clothing and clothing appropriate for easy access to any Portacath or PICC line.   We strive to give you quality time with your provider. You may need to reschedule your appointment if you arrive late (15 or more minutes).  Arriving late affects you and other patients whose appointments are after yours.  Also, if you miss three or more appointments without notifying the office, you may be dismissed from the clinic at the provider's discretion.      For prescription refill requests, have your pharmacy contact our office and allow 72 hours for refills to be completed.    Today you received the following chemotherapy and/or immunotherapy agents : Opdivo      To help prevent nausea and vomiting after your treatment, we encourage you to take your nausea medication as directed.  BELOW ARE SYMPTOMS THAT SHOULD BE REPORTED IMMEDIATELY: *FEVER GREATER THAN 100.4 F (38 C) OR HIGHER *CHILLS OR SWEATING *NAUSEA AND VOMITING THAT IS NOT CONTROLLED WITH YOUR NAUSEA MEDICATION *UNUSUAL SHORTNESS OF BREATH *UNUSUAL BRUISING OR BLEEDING *URINARY PROBLEMS (pain or burning when urinating, or frequent urination) *BOWEL PROBLEMS (unusual diarrhea, constipation, pain near the anus) TENDERNESS IN MOUTH AND THROAT WITH OR WITHOUT PRESENCE OF ULCERS (sore throat, sores in mouth, or a toothache) UNUSUAL RASH, SWELLING OR PAIN  UNUSUAL VAGINAL DISCHARGE OR ITCHING   Items with * indicate a potential emergency and should be followed up as soon as possible or go to the Emergency Department if any problems should occur.  Please show the CHEMOTHERAPY ALERT CARD or IMMUNOTHERAPY ALERT CARD at check-in to  the Emergency Department and triage nurse.  Should you have questions after your visit or need to cancel or reschedule your appointment, please contact [facility]  Dept: [phone]  and follow the prompts.  Office hours are 8:00 a.m. to 4:30 p.m. Monday - Friday. Please note that voicemails left after 4:00 p.m. may not be returned until the following business day.  We are closed weekends and major holidays. You have access to a nurse at all times for urgent questions. Please call the main number to the clinic Dept: [phone] and follow the prompts.   For any non-urgent questions, you may also contact your provider using MyChart. We now offer e-Visits for anyone 25 and older to request care online for non-urgent symptoms. For details visit [email or web address].   Also download the MyChart app! Go to the app store, search "MyChart", open the app, select [facility], and log in with your MyChart username and password.  Due to Covid, a mask is required upon entering the hospital/clinic. If you do not have a mask, one will be given to you upon arrival. For doctor visits, patients may have 1 support person aged 77 or older with them. For treatment visits, patients cannot have anyone with them due to current Covid guidelines and our immunocompromised population.   Nivolumab injection What is this medication? NIVOLUMAB (nye VOL ue mab) is a monoclonal antibody. It treats certain types of cancer. Some of the cancers treated are colon cancer, head and neck cancer, Hodgkin lymphoma, lung cancer, and melanoma. This medicine may be used for other purposes; ask  your health care provider or pharmacist if you have questions. COMMON BRAND NAME(S): Opdivo What should I tell my care team before I take this medication? They need to know if you have any of these conditions: Autoimmune diseases such as Crohn's disease, ulcerative colitis, or lupus Have had or planning to have an  allogeneic stem cell transplant (uses someone else's stem cells) History of chest radiation Organ transplant Nervous system problems such as myasthenia gravis or Guillain-Barre syndrome An unusual or allergic reaction to nivolumab, other medicines, foods, dyes, or preservatives Pregnant or trying to get pregnant Breast-feeding How should I use this medication? This medication is injected into a vein. It is given in a hospital or clinic setting. A special MedGuide will be given to you before each treatment. Be sure to read this information carefully each time. Talk to your care team regarding the use of this medication in children. While it may be prescribed for children as young as 12 years for selected conditions, precautions do apply. Overdosage: If you think you have taken too much of this medicine contact a poison control center or emergency room at once. NOTE: This medicine is only for you. Do not share this medicine with others. What if I miss a dose? Keep appointments for follow-up doses. It is important not to miss your dose. Call your care team if you are unable to keep an appointment. What may interact with this medication? Interactions have not been studied. This list may not describe all possible interactions. Give your health care provider a list of all the medicines, herbs, non-prescription drugs, or dietary supplements you use. Also tell them if you smoke, drink alcohol, or use illegal drugs. Some items may interact with your medicine. What should I watch for while using this medication? Your condition will be monitored carefully while you are receiving this medication. You may need blood work done while you are taking this medication. Do not become pregnant while taking this medication or for 5 months after stopping it. Women should inform their care team if they wish to become pregnant or think they might be pregnant. There is a potential for serious harm to an unborn child.  Talk to your care team for more information. Do not breast-feed an infant while taking this medication or for 5 months after stopping it. What side effects may I notice from receiving this medication? Side effects that you should report to your care team as soon as possible: Allergic reactions--skin rash, itching, hives, swelling of the face, lips, tongue, or throat Bloody or black, tar-like stools Change in vision Chest pain Diarrhea Dry cough, shortness of breath or trouble breathing Eye pain Fast or irregular heartbeat Fever, chills High blood sugar (hyperglycemia)--increased thirst or amount of urine, unusual weakness or fatigue, blurry vision High thyroid levels (hyperthyroidism)--fast or irregular heartbeat, weight loss, excessive sweating or sensitivity to heat, tremors or shaking, anxiety, nervousness, irregular menstrual cycle or spotting Kidney injury--decrease in the amount of urine, swelling of the ankles, hands, or feet Liver injury--right upper belly pain, loss of appetite, nausea, light-colored stool, dark yellow or brown urine, yellowing skin or eyes, unusual weakness or fatigue Low red blood cell count--unusual weakness or fatigue, dizziness, headache, trouble breathing Low thyroid levels (hypothyroidism)--unusual weakness or fatigue, increased sensitivity to cold, constipation, hair loss, dry skin, weight gain, feelings of depression Mood and behavior changes-confusion, change in sex drive or performance, irritability Muscle pain or cramps Pain, tingling, or numbness in the hands or feet, muscle weakness,  trouble walking, loss of balance or coordination Red or dark brown urine Redness, blistering, peeling, or loosening of the skin, including inside the mouth Stomach pain Unusual bruising or bleeding Side effects that usually do not require medical attention (report to your care team if they continue or are bothersome): Bone pain Constipation Loss of  appetite Nausea Tiredness Vomiting This list may not describe all possible side effects. Call your doctor for medical advice about side effects. You may report side effects to FDA at 1-800-FDA-1088. Where should I keep my medication? This medication is given in a hospital or clinic and will not be stored at home. NOTE: This sheet is a summary. It may not cover all possible information. If you have questions about this medicine, talk to your doctor, pharmacist, or health care provider.  2022 Elsevier/Gold Standard (2021-02-18 00:00:00)

## 2021-05-26 ENCOUNTER — Ambulatory Visit (HOSPITAL_COMMUNITY): Admission: RE | Admit: 2021-05-26 | Payer: Medicare Other | Source: Ambulatory Visit

## 2021-05-27 ENCOUNTER — Other Ambulatory Visit: Payer: Self-pay

## 2021-05-27 ENCOUNTER — Encounter (HOSPITAL_COMMUNITY)
Admission: RE | Admit: 2021-05-27 | Discharge: 2021-05-27 | Disposition: A | Discharge status: Home / Self Care | Payer: Medicare Other | Source: Ambulatory Visit | Attending: Oncology | Admitting: Oncology

## 2021-05-27 DIAGNOSIS — J984 Other disorders of lung: Secondary | ICD-10-CM | POA: Diagnosis not present

## 2021-05-27 DIAGNOSIS — C679 Malignant neoplasm of bladder, unspecified: Secondary | ICD-10-CM | POA: Insufficient documentation

## 2021-05-27 DIAGNOSIS — K5730 Diverticulosis of large intestine without perforation or abscess without bleeding: Secondary | ICD-10-CM | POA: Diagnosis not present

## 2021-05-27 DIAGNOSIS — I700 Atherosclerosis of aorta: Secondary | ICD-10-CM | POA: Insufficient documentation

## 2021-05-27 DIAGNOSIS — I2510 Atherosclerotic heart disease of native coronary artery without angina pectoris: Secondary | ICD-10-CM | POA: Diagnosis not present

## 2021-05-27 LAB — GLUCOSE, CAPILLARY: Glucose-Capillary: 132 mg/dL — ABNORMAL HIGH (ref 70–99)

## 2021-05-27 MED ORDER — FLUDEOXYGLUCOSE F - 18 (FDG) INJECTION
12.3000 | Freq: Once | INTRAVENOUS | Status: AC
  Administered 2021-05-27: 12.9 via INTRAVENOUS

## 2021-05-30 ENCOUNTER — Inpatient Hospital Stay: Payer: Medicare Other

## 2021-05-30 ENCOUNTER — Inpatient Hospital Stay (HOSPITAL_BASED_OUTPATIENT_CLINIC_OR_DEPARTMENT_OTHER): Payer: Medicare Other | Admitting: Oncology

## 2021-05-30 ENCOUNTER — Inpatient Hospital Stay: Payer: Medicare Other | Admitting: Dietician

## 2021-05-30 ENCOUNTER — Other Ambulatory Visit: Payer: Self-pay

## 2021-05-30 ENCOUNTER — Inpatient Hospital Stay: Payer: Medicare Other | Attending: Oncology

## 2021-05-30 VITALS — BP 167/80 | HR 77 | Temp 97.6°F | Resp 20 | Ht 71.0 in | Wt 262.1 lb

## 2021-05-30 DIAGNOSIS — C679 Malignant neoplasm of bladder, unspecified: Secondary | ICD-10-CM | POA: Insufficient documentation

## 2021-05-30 DIAGNOSIS — Z79899 Other long term (current) drug therapy: Secondary | ICD-10-CM | POA: Diagnosis not present

## 2021-05-30 DIAGNOSIS — Z95828 Presence of other vascular implants and grafts: Secondary | ICD-10-CM

## 2021-05-30 DIAGNOSIS — C678 Malignant neoplasm of overlapping sites of bladder: Secondary | ICD-10-CM

## 2021-05-30 DIAGNOSIS — C787 Secondary malignant neoplasm of liver and intrahepatic bile duct: Secondary | ICD-10-CM | POA: Diagnosis not present

## 2021-05-30 DIAGNOSIS — C7951 Secondary malignant neoplasm of bone: Secondary | ICD-10-CM | POA: Insufficient documentation

## 2021-05-30 DIAGNOSIS — Z5112 Encounter for antineoplastic immunotherapy: Secondary | ICD-10-CM | POA: Diagnosis not present

## 2021-05-30 LAB — CMP (CANCER CENTER ONLY)
ALT: 15 U/L (ref 0–44)
AST: 18 U/L (ref 15–41)
Albumin: 3.9 g/dL (ref 3.5–5.0)
Alkaline Phosphatase: 59 U/L (ref 38–126)
Anion gap: 10 (ref 5–15)
BUN: 18 mg/dL (ref 8–23)
CO2: 23 mmol/L (ref 22–32)
Calcium: 8.5 mg/dL — ABNORMAL LOW (ref 8.9–10.3)
Chloride: 105 mmol/L (ref 98–111)
Creatinine: 1.06 mg/dL (ref 0.61–1.24)
GFR, Estimated: >60 mL/min (ref >60)
Glucose, Bld: 138 mg/dL — ABNORMAL HIGH (ref 70–99)
Potassium: 4 mmol/L (ref 3.5–5.1)
Sodium: 138 mmol/L (ref 135–145)
Total Bilirubin: 0.4 mg/dL (ref 0.3–1.2)
Total Protein: 7.3 g/dL (ref 6.5–8.1)

## 2021-05-30 LAB — CBC WITH DIFFERENTIAL (CANCER CENTER ONLY)
Abs Immature Granulocytes: 0.01 10*3/uL (ref 0.00–0.07)
Basophils Absolute: 0 10*3/uL (ref 0.0–0.1)
Basophils Relative: 1 %
Eosinophils Absolute: 0.2 10*3/uL (ref 0.0–0.5)
Eosinophils Relative: 3 %
HCT: 30.8 % — ABNORMAL LOW (ref 39.0–52.0)
Hemoglobin: 10.6 g/dL — ABNORMAL LOW (ref 13.0–17.0)
Immature Granulocytes: 0 %
Lymphocytes Relative: 18 %
Lymphs Abs: 0.9 10*3/uL (ref 0.7–4.0)
MCH: 31.5 pg (ref 26.0–34.0)
MCHC: 34.4 g/dL (ref 30.0–36.0)
MCV: 91.4 fL (ref 80.0–100.0)
Monocytes Absolute: 0.4 10*3/uL (ref 0.1–1.0)
Monocytes Relative: 8 %
Neutro Abs: 3.3 10*3/uL (ref 1.7–7.7)
Neutrophils Relative %: 70 %
Platelet Count: 109 10*3/uL — ABNORMAL LOW (ref 150–400)
RBC: 3.37 MIL/uL — ABNORMAL LOW (ref 4.22–5.81)
RDW: 14.4 % (ref 11.5–15.5)
WBC Count: 4.8 10*3/uL (ref 4.0–10.5)
nRBC: 0 % (ref 0.0–0.2)

## 2021-05-30 LAB — MAGNESIUM: Magnesium: 1.6 mg/dL — ABNORMAL LOW (ref 1.7–2.4)

## 2021-05-30 LAB — TSH: TSH: 4.259 u[IU]/mL — ABNORMAL HIGH (ref 0.320–4.118)

## 2021-05-30 MED ORDER — SODIUM CHLORIDE 0.9% FLUSH
10.0000 mL | Freq: Once | INTRAVENOUS | Status: AC
Start: 2021-05-30 — End: 2021-05-30
  Administered 2021-05-30: 10 mL

## 2021-05-30 MED ORDER — HEPARIN SOD (PORK) LOCK FLUSH 100 UNIT/ML IV SOLN
500.0000 [IU] | Freq: Once | INTRAVENOUS | Status: AC | PRN
  Administered 2021-05-30: 500 [IU]

## 2021-05-30 MED ORDER — SODIUM CHLORIDE 0.9% FLUSH
10.0000 mL | INTRAVENOUS | Status: DC | PRN
  Administered 2021-05-30: 10 mL

## 2021-05-30 MED ORDER — SODIUM CHLORIDE 0.9 % IV SOLN
480.0000 mg | Freq: Once | INTRAVENOUS | Status: AC
  Administered 2021-05-30: 480 mg via INTRAVENOUS
  Filled 2021-05-30: qty 48

## 2021-05-30 MED ORDER — SODIUM CHLORIDE 0.9 % IV SOLN: Freq: Once | INTRAVENOUS | Status: AC

## 2021-05-30 NOTE — Progress Notes (Signed)
Hematology and Oncology Follow Up Visit  Javier Sellers [id number] 1951/01/20 70 y.o. 05/30/2021 8:14 AM [name], [place], [name], MD   Principle Diagnosis: 70 year old man with bladder cancer diagnosed in June 2022.  He was found to have stage IV high-grade urothelial carcinoma with hepatic metastasis.  Secondary diagnosis: Left lung neoplasm consistent with non-small cell lung cancer diagnosed in July 2022.  Prior Therapy: He underwent TURBT on November 13, 2020 and the final pathology revealed high-grade urothelial carcinoma with small cell component of 70%.  Chemotherapy utilizing gemcitabine and cisplatin started on December 27, 2020.  He completed 6 cycles of therapy and October 2022.  Current therapy: Nivolumab 480 mg monthly started on May 02, 2021.  Interim History: Mr. Trageser presents today for a follow-up visit.  Since the last visit, he reports no major changes in his health.  He has tolerated nivolumab without any major complaints.  He denies any nausea, abdominal pain or excessive fatigue or tiredness.  His performance status quality of life remained excellent.  He denies any skin rashes or lesions.  He denies any changes in his bowel habits or urination difficulties.     Medications: Reviewed without changes Current Outpatient Medications  Medication Sig Dispense Refill   amLODipine (NORVASC) 5 MG tablet Take 5 mg by mouth every morning.     diclofenac Sodium (VOLTAREN) 1 % GEL Apply 1 application topically daily as needed (knee pain).     lidocaine-prilocaine (EMLA) cream Apply 1 application topically as needed. 30 g 0   lisinopril (ZESTRIL) 40 MG tablet Take 40 mg by mouth every morning.     Menthol-Camphor (ICY HOT ADVANCED PAIN RELIEF) 16-11 % CREA Apply 1 application topically daily as needed (pain).     metoprolol succinate (TOPROL-XL) 100 MG 24 hr tablet Take 100 mg by mouth every morning.     pravastatin (PRAVACHOL) 80 MG tablet Take 80 mg by mouth at  bedtime.     prochlorperazine (COMPAZINE) 10 MG tablet Take 1 tablet (10 mg total) by mouth every 6 (six) hours as needed for nausea or vomiting. 30 tablet 0   tamsulosin (FLOMAX) 0.4 MG CAPS capsule Take 0.4 mg by mouth daily.     No current facility-administered medications for this visit.     Allergies: No Known Allergies   Physical Exam:    Blood pressure (!) 167/80, pulse 77, temperature 97.6 F (36.4 C), temperature source Tympanic, resp. rate 20, height 5\' 11"  (1.803 m), weight 262 lb 1.6 oz (118.9 kg), SpO2 100 %.     ECOG: 1   General appearance: Alert, awake without any distress. Head: Atraumatic without abnormalities Oropharynx: Without any thrush or ulcers. Eyes: No scleral icterus. Lymph nodes: No lymphadenopathy noted in the cervical, supraclavicular, or axillary nodes Heart:regular rate and rhythm, without any murmurs or gallops.   Lung: Clear to auscultation without any rhonchi, wheezes or dullness to percussion. Abdomin: Soft, nontender without any shifting dullness or ascites. Musculoskeletal: No clubbing or cyanosis. Neurological: No motor or sensory deficits. Skin: No rashes or lesions.        Lab Results: Lab Results  Component Value Date   WBC 2.7 (L) 05/02/2021   HGB 9.8 (L) 05/02/2021   HCT 28.5 (L) 05/02/2021   MCV 91.9 05/02/2021   PLT 157 05/02/2021     Chemistry      Component Value Date/Time   NA 137 05/02/2021 1006   K 3.8 05/02/2021 1006   CL 103 05/02/2021  1006   CO2 24 05/02/2021 1006   BUN 13 05/02/2021 1006   CREATININE 1.04 05/02/2021 1006      Component Value Date/Time   CALCIUM 9.4 05/02/2021 1006   ALKPHOS 66 05/02/2021 1006   AST 19 05/02/2021 1006   ALT 15 05/02/2021 1006   BILITOT 0.2 (L) 05/02/2021 1006     IMPRESSION: 1. Decreased metabolic activity within the left apical lung lesion consistent with response to therapy. No residual hypermetabolic mediastinal or hilar adenopathy. 2. No residual or  recurrent hypermetabolic metastatic disease to the liver or bones. 3. No new findings. 4. Aortic Atherosclerosis (ICD10-I70.0). Prominent aortic valvular calcifications.     Impression and Plan:  70 year old man with:  1.  Stage IV high-grade urothelial carcinoma of the bladder with hepatic and have bone disease diagnosed in June 2022.   His disease status was updated at this time and treatment choices were reviewed.  PET scan obtained on May 27, 2021 showed excellent response to therapy with near resolution of his hypermetabolic activity in the lung, liver and bone.  I recommended continued switch maintenance at this time with immunotherapy.  Risks and benefits of this treatment were discussed at this time.  Potential complications that include immune mediated complications, GI toxicity among others.  He is agreeable to continue.      2.  IV access: Port-A-Cath continues to be in use without any issues.   3.  Antiemetics: No nausea or vomiting reported at this time.  Compazine is available to him.   4.  Renal function surveillance: No issues noted with his kidney function after platinum therapy.   5.  Goals of care: His disease is unlikely to be cured but aggressive measures are warranted given his excellent response.   6.  Lung neoplasm: Continues to respond to systemic therapy without any need for any additional intervention.  7.  Neutropenia: Resolved at this time after the conclusion of chemotherapy.  No need for growth factor support.  8.  Immune mediated complications: He has not experienced any issues at this time.  I continue to educate him about pneumonitis, colitis, hepatitis and hypophysitis among others.   9.  Follow-up: In 1 month for repeat follow-up   30  minutes were spent on this encounter.  The time was dedicated to reviewing laboratory data, imaging studies, treatment choices and future plan of care discussion.    Zola Button, MD 12/16/20228:14  AM

## 2021-05-30 NOTE — Progress Notes (Signed)
Nutrition Follow-up:  Patient completed 6 cycles gemcitabine/cisplatin for stage IV bladder cancer 10/22. He is currently receiving maintenance immunotherapy with nivolumab q4 weeks.   Noted PET scan on 12/13 showed near resolution of hypermetabolic activity in lung, liver, and bone.   Met with patient during infusion. He is in good spirits, feeling glad to have received good news about PET scan today. Patient reports tolerating first cycle of immunotherapy well, reports one day of nausea. This resolved after taking compazine. Patient reports his appetite is back and eating well. He denies nausea, vomiting, diarrhea, constipation.    Medications: reviewed  Labs: Mg 1.6  Anthropometrics: Weight 262 lb 1.6 oz today increased from 260 lb on 12/13 and 258 lb 11.2 oz on 10/28  9/23 - 253 lb 8 oz  8/11 - 256 lb 8 oz 7/8 - 275 lb    NUTRITION DIAGNOSIS: Inadequate oral intake improved    INTERVENTION:  Continue eating high calorie high protein foods for weight maintenance Reviewed tips for nausea - pt has handout Patient has contact information      MONITORING, EVALUATION, GOAL: weight trends, intake    NEXT VISIT: No follow-up scheduled at this time. Patient encouraged to contact with nutrition questions/concerns

## 2021-05-30 NOTE — Patient Instructions (Signed)
[facility]  Discharge Instructions: ?Thank you for choosing [facility] to provide your oncology and hematology care.  ? ?If you have a lab appointment with the Cancer Center, please go directly to the Cancer Center and check in at the registration area. ?  ?Wear comfortable clothing and clothing appropriate for easy access to any Portacath or PICC line.  ? ?We strive to give you quality time with your provider. You may need to reschedule your appointment if you arrive late (15 or more minutes).  Arriving late affects you and other patients whose appointments are after yours.  Also, if you miss three or more appointments without notifying the office, you may be dismissed from the clinic at the provider?s discretion.    ?  ?For prescription refill requests, have your pharmacy contact our office and allow 72 hours for refills to be completed.   ? ?Today you received the following chemotherapy and/or immunotherapy agents Opdivo    ?  ?To help prevent nausea and vomiting after your treatment, we encourage you to take your nausea medication as directed. ? ?BELOW ARE SYMPTOMS THAT SHOULD BE REPORTED IMMEDIATELY: ?*FEVER GREATER THAN 100.4 F (38 ?C) OR HIGHER ?*CHILLS OR SWEATING ?*NAUSEA AND VOMITING THAT IS NOT CONTROLLED WITH YOUR NAUSEA MEDICATION ?*UNUSUAL SHORTNESS OF BREATH ?*UNUSUAL BRUISING OR BLEEDING ?*URINARY PROBLEMS (pain or burning when urinating, or frequent urination) ?*BOWEL PROBLEMS (unusual diarrhea, constipation, pain near the anus) ?TENDERNESS IN MOUTH AND THROAT WITH OR WITHOUT PRESENCE OF ULCERS (sore throat, sores in mouth, or a toothache) ?UNUSUAL RASH, SWELLING OR PAIN  ?UNUSUAL VAGINAL DISCHARGE OR ITCHING  ? ?Items with * indicate a potential emergency and should be followed up as soon as possible or go to the Emergency Department if any problems should occur. ? ?Please show the CHEMOTHERAPY ALERT CARD or IMMUNOTHERAPY ALERT CARD at check-in to the  Emergency Department and triage nurse. ? ?Should you have questions after your visit or need to cancel or reschedule your appointment, please contact [facility]  Dept: [phone]  and follow the prompts.  Office hours are 8:00 a.m. to 4:30 p.m. Monday - Friday. Please note that voicemails left after 4:00 p.m. may not be returned until the following business day.  We are closed weekends and major holidays. You have access to a nurse at all times for urgent questions. Please call the main number to the clinic Dept: [phone] and follow the prompts. ? ? ?For any non-urgent questions, you may also contact your provider using MyChart. We now offer e-Visits for anyone 18 and older to request care online for non-urgent symptoms. For details visit [email or web address]. ?  ?Also download the MyChart app! Go to the app store, search "MyChart", open the app, select [facility], and log in with your MyChart username and password. ? ?Due to Covid, a mask is required upon entering the hospital/clinic. If you do not have a mask, one will be given to you upon arrival. For doctor visits, patients may have 1 support person aged 18 or older with them. For treatment visits, patients cannot have anyone with them due to current Covid guidelines and our immunocompromised population.  ? ?

## 2021-06-13 DIAGNOSIS — E039 Hypothyroidism, unspecified: Secondary | ICD-10-CM | POA: Diagnosis not present

## 2021-06-13 DIAGNOSIS — I10 Essential (primary) hypertension: Secondary | ICD-10-CM | POA: Diagnosis not present

## 2021-06-13 DIAGNOSIS — E7800 Pure hypercholesterolemia, unspecified: Secondary | ICD-10-CM | POA: Diagnosis not present

## 2021-06-26 ENCOUNTER — Inpatient Hospital Stay: Payer: Medicare Other

## 2021-06-26 ENCOUNTER — Inpatient Hospital Stay (HOSPITAL_BASED_OUTPATIENT_CLINIC_OR_DEPARTMENT_OTHER): Payer: Medicare Other | Admitting: Oncology

## 2021-06-26 ENCOUNTER — Inpatient Hospital Stay: Payer: Medicare Other | Attending: Oncology

## 2021-06-26 ENCOUNTER — Other Ambulatory Visit: Payer: Self-pay

## 2021-06-26 VITALS — BP 149/71 | HR 75 | Temp 97.9°F | Resp 18 | Ht 71.0 in | Wt 263.4 lb

## 2021-06-26 DIAGNOSIS — C679 Malignant neoplasm of bladder, unspecified: Secondary | ICD-10-CM | POA: Insufficient documentation

## 2021-06-26 DIAGNOSIS — C787 Secondary malignant neoplasm of liver and intrahepatic bile duct: Secondary | ICD-10-CM | POA: Diagnosis not present

## 2021-06-26 DIAGNOSIS — Z5112 Encounter for antineoplastic immunotherapy: Secondary | ICD-10-CM | POA: Insufficient documentation

## 2021-06-26 DIAGNOSIS — Z95828 Presence of other vascular implants and grafts: Secondary | ICD-10-CM

## 2021-06-26 DIAGNOSIS — C678 Malignant neoplasm of overlapping sites of bladder: Secondary | ICD-10-CM

## 2021-06-26 LAB — CBC WITH DIFFERENTIAL (CANCER CENTER ONLY)
Abs Immature Granulocytes: 0.01 K/uL (ref 0.00–0.07)
Basophils Absolute: 0.1 K/uL (ref 0.0–0.1)
Basophils Relative: 1 %
Eosinophils Absolute: 0.2 K/uL (ref 0.0–0.5)
Eosinophils Relative: 3 %
HCT: 32.8 % — ABNORMAL LOW (ref 39.0–52.0)
Hemoglobin: 11.2 g/dL — ABNORMAL LOW (ref 13.0–17.0)
Immature Granulocytes: 0 %
Lymphocytes Relative: 21 %
Lymphs Abs: 1 K/uL (ref 0.7–4.0)
MCH: 30.9 pg (ref 26.0–34.0)
MCHC: 34.1 g/dL (ref 30.0–36.0)
MCV: 90.4 fL (ref 80.0–100.0)
Monocytes Absolute: 0.5 K/uL (ref 0.1–1.0)
Monocytes Relative: 10 %
Neutro Abs: 3 K/uL (ref 1.7–7.7)
Neutrophils Relative %: 65 %
Platelet Count: 114 K/uL — ABNORMAL LOW (ref 150–400)
RBC: 3.63 MIL/uL — ABNORMAL LOW (ref 4.22–5.81)
RDW: 13.9 % (ref 11.5–15.5)
WBC Count: 4.6 K/uL (ref 4.0–10.5)
nRBC: 0 % (ref 0.0–0.2)

## 2021-06-26 LAB — CMP (CANCER CENTER ONLY)
ALT: 17 U/L (ref 0–44)
AST: 21 U/L (ref 15–41)
Albumin: 4.1 g/dL (ref 3.5–5.0)
Alkaline Phosphatase: 55 U/L (ref 38–126)
Anion gap: 10 (ref 5–15)
BUN: 23 mg/dL (ref 8–23)
CO2: 23 mmol/L (ref 22–32)
Calcium: 9.4 mg/dL (ref 8.9–10.3)
Chloride: 103 mmol/L (ref 98–111)
Creatinine: 1.08 mg/dL (ref 0.61–1.24)
GFR, Estimated: >60 mL/min
Glucose, Bld: 155 mg/dL — ABNORMAL HIGH (ref 70–99)
Potassium: 3.8 mmol/L (ref 3.5–5.1)
Sodium: 136 mmol/L (ref 135–145)
Total Bilirubin: 0.6 mg/dL (ref 0.3–1.2)
Total Protein: 7.4 g/dL (ref 6.5–8.1)

## 2021-06-26 LAB — MAGNESIUM: Magnesium: 1.5 mg/dL — ABNORMAL LOW (ref 1.7–2.4)

## 2021-06-26 MED ORDER — SODIUM CHLORIDE 0.9 % IV SOLN
480.0000 mg | Freq: Once | INTRAVENOUS | Status: AC
  Administered 2021-06-26: 480 mg via INTRAVENOUS
  Filled 2021-06-26: qty 48

## 2021-06-26 MED ORDER — SODIUM CHLORIDE 0.9% FLUSH
10.0000 mL | Freq: Once | INTRAVENOUS | Status: AC
  Administered 2021-06-26: 10 mL

## 2021-06-26 MED ORDER — SODIUM CHLORIDE 0.9% FLUSH
10.0000 mL | INTRAVENOUS | Status: DC | PRN
  Administered 2021-06-26: 10 mL

## 2021-06-26 MED ORDER — SODIUM CHLORIDE 0.9 % IV SOLN: Freq: Once | INTRAVENOUS | Status: AC

## 2021-06-26 MED ORDER — HEPARIN SOD (PORK) LOCK FLUSH 100 UNIT/ML IV SOLN
500.0000 [IU] | Freq: Once | INTRAVENOUS | Status: AC | PRN
  Administered 2021-06-26: 500 [IU]

## 2021-06-26 NOTE — Progress Notes (Signed)
Hematology and Oncology Follow Up Visit  Javier Sellers [id number] 14-Nov-1950 71 y.o. 06/26/2021 11:47 AM Javier Sellers, MDRedding, Javier Blonder, MD   Principle Diagnosis: 71 year old man with stage IV high-grade urothelial carcinoma of the bladder with hepatic metastasis diagnosed in June 2022.  Secondary diagnosis: Left lung neoplasm consistent with non-small cell lung cancer diagnosed in July 2022.  Prior Therapy: He underwent TURBT on November 13, 2020 and the final pathology revealed high-grade urothelial carcinoma with small cell component of 70%.  Chemotherapy utilizing gemcitabine and cisplatin started on December 27, 2020.  He completed 6 cycles of therapy and October 2022.  Current therapy: Nivolumab 480 mg monthly started on May 02, 2021.  He is here for the next cycle of therapy.  Interim History: [name] returns today for repeat evaluation.  Since the last visit, he reports no major changes in his health.  He continues to tolerate nivolumab without any complaints.  He denies any nausea, vomiting or abdominal pain.  He denies any hospitalizations or illnesses.  He denies any worsening neuropathy or fatigue.     Medications: Reviewed without changes. Current Outpatient Medications  Medication Sig Dispense Refill   amLODipine (NORVASC) 5 MG tablet Take 5 mg by mouth every morning.     diclofenac Sodium (VOLTAREN) 1 % GEL Apply 1 application topically daily as needed (knee pain).     lidocaine-prilocaine (EMLA) cream Apply 1 application topically as needed. 30 g 0   lisinopril (ZESTRIL) 40 MG tablet Take 40 mg by mouth every morning.     Menthol-Camphor (ICY HOT ADVANCED PAIN RELIEF) 16-11 % CREA Apply 1 application topically daily as needed (pain).     metoprolol succinate (TOPROL-XL) 100 MG 24 hr tablet Take 100 mg by mouth every morning.     pravastatin (PRAVACHOL) 80 MG tablet Take 80 mg by mouth at bedtime.     prochlorperazine (COMPAZINE) 10 MG tablet Take 1 tablet (10 mg  total) by mouth every 6 (six) hours as needed for nausea or vomiting. 30 tablet 0   tamsulosin (FLOMAX) 0.4 MG CAPS capsule Take 0.4 mg by mouth daily.     No current facility-administered medications for this visit.   Facility-Administered Medications Ordered in Other Visits  Medication Dose Route Frequency Provider Last Rate Last Admin   sodium chloride flush (NS) 0.9 % injection 10 mL  10 mL Intracatheter Once Wyatt Portela, MD         Allergies: No Known Allergies   Physical Exam:    Blood pressure (!) 149/71, pulse 75, temperature 97.9 F (36.6 C), temperature source Axillary, resp. rate 18, height 5\' 11"  (1.803 m), weight 263 lb 6.4 oz (119.5 kg), SpO2 100 %.      ECOG: 1    General appearance: Comfortable appearing without any discomfort Head: Normocephalic without any trauma Oropharynx: Mucous membranes are moist and pink without any thrush or ulcers. Eyes: Pupils are equal and round reactive to light. Lymph nodes: No cervical, supraclavicular, inguinal or axillary lymphadenopathy.   Heart:regular rate and rhythm.  S1 and S2 without leg edema. Lung: Clear without any rhonchi or wheezes.  No dullness to percussion. Abdomin: Soft, nontender, nondistended with good bowel sounds.  No hepatosplenomegaly. Musculoskeletal: No joint deformity or effusion.  Full range of motion noted. Neurological: No deficits noted on motor, sensory and deep tendon reflex exam. Skin: No petechial rash or dryness.  Appeared moist.         Lab Results: Lab Results  Component Value Date   WBC 4.8 05/30/2021   HGB 10.6 (L) 05/30/2021   HCT 30.8 (L) 05/30/2021   MCV 91.4 05/30/2021   PLT 109 (L) 05/30/2021     Chemistry      Component Value Date/Time   NA 138 05/30/2021 0901   K 4.0 05/30/2021 0901   CL 105 05/30/2021 0901   CO2 23 05/30/2021 0901   BUN 18 05/30/2021 0901   CREATININE 1.06 05/30/2021 0901      Component Value Date/Time   CALCIUM 8.5 (L) 05/30/2021 0901    ALKPHOS 59 05/30/2021 0901   AST 18 05/30/2021 0901   ALT 15 05/30/2021 0901   BILITOT 0.4 05/30/2021 0901         Impression and Plan:  71 year old man with:  1.  Bladder cancer diagnosed in June 2022.  He was found to have stage IV high-grade urothelial carcinoma.    His disease status was updated at this time.  Risks and benefits of continuing nivolumab were reiterated.  These complications that include immune mediated issues, GI toxicities among others.  Different salvage therapy options including oral targeted therapy versus antibiotic drug conjugate were also reviewed if he develops progression of disease.  Plan is to update his staging scan in 3 months.      2.  IV access: Port-A-Cath remains in place and in use without complications.   3.  Antiemetics: Compazine is available to him without any nausea or vomiting.   4.  Renal function surveillance: His kidney function remains within normal range despite platinum based therapy previously.   5.  Goals of care: Therapy remains palliative although aggressive measures are warranted given his excellent response.   6.  Lung neoplasm: We will continue to monitor on subsequent scans.  His nodule appears to have improved with the previous therapy.    8.  Immune mediated complications: We continue to educate him about these issues.  These include pneumonitis, colitis and thyroid disease.  He is not experiencing any at this time.   9.  Follow-up: In 4 weeks for repeat follow-up.   30  minutes were dedicated to this visit.  Time spent on reviewing laboratory data, disease status update and outlining future plan of care.    Zola Button, MD 1/12/202311:47 AM

## 2021-06-26 NOTE — Patient Instructions (Signed)
[facility]  Discharge Instructions: Thank you for choosing [facility] to provide your oncology and hematology care.   If you have a lab appointment with the Cancer Center, please go directly to the Cancer Center and check in at the registration area.   Wear comfortable clothing and clothing appropriate for easy access to any Portacath or PICC line.   We strive to give you quality time with your provider. You may need to reschedule your appointment if you arrive late (15 or more minutes).  Arriving late affects you and other patients whose appointments are after yours.  Also, if you miss three or more appointments without notifying the office, you may be dismissed from the clinic at the provider's discretion.      For prescription refill requests, have your pharmacy contact our office and allow 72 hours for refills to be completed.    Today you received the following chemotherapy and/or immunotherapy agents: Nivolumab.      To help prevent nausea and vomiting after your treatment, we encourage you to take your nausea medication as directed.  BELOW ARE SYMPTOMS THAT SHOULD BE REPORTED IMMEDIATELY: *FEVER GREATER THAN 100.4 F (38 C) OR HIGHER *CHILLS OR SWEATING *NAUSEA AND VOMITING THAT IS NOT CONTROLLED WITH YOUR NAUSEA MEDICATION *UNUSUAL SHORTNESS OF BREATH *UNUSUAL BRUISING OR BLEEDING *URINARY PROBLEMS (pain or burning when urinating, or frequent urination) *BOWEL PROBLEMS (unusual diarrhea, constipation, pain near the anus) TENDERNESS IN MOUTH AND THROAT WITH OR WITHOUT PRESENCE OF ULCERS (sore throat, sores in mouth, or a toothache) UNUSUAL RASH, SWELLING OR PAIN  UNUSUAL VAGINAL DISCHARGE OR ITCHING   Items with * indicate a potential emergency and should be followed up as soon as possible or go to the Emergency Department if any problems should occur.  Please show the CHEMOTHERAPY ALERT CARD or IMMUNOTHERAPY ALERT CARD at check-in to  the Emergency Department and triage nurse.  Should you have questions after your visit or need to cancel or reschedule your appointment, please contact [facility]  Dept: [phone]  and follow the prompts.  Office hours are 8:00 a.m. to 4:30 p.m. Monday - Friday. Please note that voicemails left after 4:00 p.m. may not be returned until the following business day.  We are closed weekends and major holidays. You have access to a nurse at all times for urgent questions. Please call the main number to the clinic Dept: [phone] and follow the prompts.   For any non-urgent questions, you may also contact your provider using MyChart. We now offer e-Visits for anyone 18 and older to request care online for non-urgent symptoms. For details visit [email or web address].   Also download the MyChart app! Go to the app store, search "MyChart", open the app, select [facility], and log in with your MyChart username and password.  Due to Covid, a mask is required upon entering the hospital/clinic. If you do not have a mask, one will be given to you upon arrival. For doctor visits, patients may have 1 support person aged 18 or older with them. For treatment visits, patients cannot have anyone with them due to current Covid guidelines and our immunocompromised population.   

## 2021-07-07 ENCOUNTER — Ambulatory Visit (INDEPENDENT_AMBULATORY_CARE_PROVIDER_SITE_OTHER): Payer: Medicare Other | Admitting: Pulmonary Disease

## 2021-07-07 ENCOUNTER — Other Ambulatory Visit: Payer: Self-pay

## 2021-07-07 ENCOUNTER — Encounter: Payer: Self-pay | Admitting: Pulmonary Disease

## 2021-07-07 VITALS — BP 152/66 | HR 71 | Temp 97.7°F | Ht 72.0 in | Wt 261.0 lb

## 2021-07-07 DIAGNOSIS — Z87891 Personal history of nicotine dependence: Secondary | ICD-10-CM | POA: Diagnosis not present

## 2021-07-07 DIAGNOSIS — C3412 Malignant neoplasm of upper lobe, left bronchus or lung: Secondary | ICD-10-CM

## 2021-07-07 DIAGNOSIS — Z6837 Body mass index (BMI) 37.0-37.9, adult: Secondary | ICD-10-CM | POA: Diagnosis not present

## 2021-07-07 DIAGNOSIS — R918 Other nonspecific abnormal finding of lung field: Secondary | ICD-10-CM | POA: Diagnosis not present

## 2021-07-07 DIAGNOSIS — Z0000 Encounter for general adult medical examination without abnormal findings: Secondary | ICD-10-CM | POA: Diagnosis not present

## 2021-07-07 DIAGNOSIS — I10 Essential (primary) hypertension: Secondary | ICD-10-CM | POA: Diagnosis not present

## 2021-07-07 DIAGNOSIS — C679 Malignant neoplasm of bladder, unspecified: Secondary | ICD-10-CM | POA: Diagnosis not present

## 2021-07-07 DIAGNOSIS — G609 Hereditary and idiopathic neuropathy, unspecified: Secondary | ICD-10-CM | POA: Diagnosis not present

## 2021-07-07 DIAGNOSIS — Z1331 Encounter for screening for depression: Secondary | ICD-10-CM | POA: Diagnosis not present

## 2021-07-07 NOTE — Progress Notes (Signed)
Synopsis: Referred in June 2022 for lung nodule by [name], MD  Subjective:   PATIENT ID: Javier Sellers GENDER: male DOB: [birth year], MRN: [id number]  Chief Complaint  Patient presents with   Follow-up    Follow up. Patient has no complaints.      71 year old gentleman, past medical history of high cholesterol, hypertension, former smoker quit 1992, 70-pack-year history.Presents today for evaluation of abnormal imaging.Patient had follow-up with urology, muscle invasive bladder cancer.  Bladder pathology had small cell features.  Appointment scheduled with Dr. Alen Blew from medical oncology.  Found to have a 2.3 cm spiculated lesion within the left upper lobe this was concerning for a primary lung malignancy.  CT scan of the chest was completed at [facility].  CT imaging is viewable in PACS system.  2.3 x 1.6 cm left lung apical, medial spiculated nodule concerning for primary bronchogenic carcinoma.  Patient already has a PET scan that has been ordered but not completed yet.  From a respiratory standpoint he is doing well today.  Overall anxious regarding recent diagnosis and abnormal CT imaging results.  OV 01/08/2021: Here today for follow-up after bronchoscopy.  Patient was diagnosed with a primary lung malignancy.  Also undergoing treatments for bladder cancer.Patient had PET scan following bronchoscopy which revealed multifocal metastasis within the liver.  Also has 2 hypermetabolic skeletal metastasis.  Patient is currently undergoing his chemotherapy and doing well with this.  From a respiratory standpoint he is stable.  Does not have any significant shortness of breath.  Denies hemoptysis additionally his weight has been stable.  oV 07/07/2021: Patient here today for follow-up after completion of chemotherapy.  Currently on immune therapy.  Tolerating treatments well.  Had a recent nuclear medicine pet image that showed good response to treatments.  Past Medical History:   Diagnosis Date   Complication of anesthesia    vomiting only with ether   High cholesterol    Hypertension      No family history on file.   Past Surgical History:  Procedure Laterality Date   APPENDECTOMY     BRONCHIAL BIOPSY  12/20/2020   Procedure: BRONCHIAL BIOPSIES;  Surgeon: Garner Nash, DO;  Location: [facility];  Service: Pulmonary;;   BRONCHIAL BRUSHINGS  12/20/2020   Procedure: BRONCHIAL BRUSHINGS;  Surgeon: Garner Nash, DO;  Location: [place];  Service: Pulmonary;;   BRONCHIAL NEEDLE ASPIRATION BIOPSY  12/20/2020   Procedure: BRONCHIAL NEEDLE ASPIRATION BIOPSIES;  Surgeon: Garner Nash, DO;  Location: [place];  Service: Pulmonary;;   BRONCHIAL WASHINGS  12/20/2020   Procedure: BRONCHIAL WASHINGS;  Surgeon: Garner Nash, DO;  Location: [place];  Service: Pulmonary;;   FIDUCIAL MARKER PLACEMENT  12/20/2020   Procedure: FIDUCIAL MARKER PLACEMENT;  Surgeon: Garner Nash, DO;  Location: [facility];  Service: Pulmonary;;   IR IMAGING GUIDED PORT INSERTION  12/26/2020   ROTATOR CUFF REPAIR Bilateral    VIDEO BRONCHOSCOPY WITH ENDOBRONCHIAL NAVIGATION Left 12/20/2020   Procedure: VIDEO BRONCHOSCOPY WITH ENDOBRONCHIAL NAVIGATION;  Surgeon: Garner Nash, DO;  Location: [place];  Service: Pulmonary;  Laterality: Left;    Social History   Socioeconomic History   Marital status: Married    Spouse name: Not on file   Number of children: Not on file   Years of education: Not on file   Highest education level: Not on file  Occupational History   Not on file  Tobacco Use   Smoking status: Former  Packs/day: 3.50    Years: 20.00    Pack years: 70.00    Types: Cigarettes    Quit date: 11/14/1990    Years since quitting: 30.6   Smokeless tobacco: Never  Vaping Use   Vaping Use: Never used  Substance and Sexual Activity   Alcohol use: Yes    Comment: 3 cans beer daily   Drug use: Never   Sexual activity: Not on file  Other Topics  Concern   Not on file  Social History Narrative   Not on file   Social Determinants of Health   Financial Resource Strain: Not on file  Food Insecurity: Not on file  Transportation Needs: Not on file  Physical Activity: Not on file  Stress: Not on file  Social Connections: Not on file  Intimate Partner Violence: Not on file     No Known Allergies   Outpatient Medications Prior to Visit  Medication Sig Dispense Refill   amLODipine (NORVASC) 5 MG tablet Take 5 mg by mouth every morning.     diclofenac Sodium (VOLTAREN) 1 % GEL Apply 1 application topically daily as needed (knee pain).     lidocaine-prilocaine (EMLA) cream Apply 1 application topically as needed. 30 g 0   lisinopril (ZESTRIL) 40 MG tablet Take 40 mg by mouth every morning.     Menthol-Camphor (ICY HOT ADVANCED PAIN RELIEF) 16-11 % CREA Apply 1 application topically daily as needed (pain).     metoprolol succinate (TOPROL-XL) 100 MG 24 hr tablet Take 100 mg by mouth every morning.     pravastatin (PRAVACHOL) 80 MG tablet Take 80 mg by mouth at bedtime.     prochlorperazine (COMPAZINE) 10 MG tablet Take 1 tablet (10 mg total) by mouth every 6 (six) hours as needed for nausea or vomiting. 30 tablet 0   tamsulosin (FLOMAX) 0.4 MG CAPS capsule Take 0.4 mg by mouth daily.     No facility-administered medications prior to visit.    Review of Systems  Constitutional:  Negative for chills, fever, malaise/fatigue and weight loss.  HENT:  Negative for hearing loss, sore throat and tinnitus.   Eyes:  Negative for blurred vision and double vision.  Respiratory:  Negative for cough, hemoptysis, sputum production, shortness of breath, wheezing and stridor.   Cardiovascular:  Negative for chest pain, palpitations, orthopnea, leg swelling and PND.  Gastrointestinal:  Negative for abdominal pain, constipation, diarrhea, heartburn, nausea and vomiting.  Genitourinary:  Negative for dysuria, hematuria and urgency.  Musculoskeletal:   Negative for joint pain and myalgias.  Skin:  Negative for itching and rash.  Neurological:  Negative for dizziness, tingling, weakness and headaches.  Endo/Heme/Allergies:  Negative for environmental allergies. Does not bruise/bleed easily.  Psychiatric/Behavioral:  Negative for depression. The patient is not nervous/anxious and does not have insomnia.   All other systems reviewed and are negative.   Objective:  Physical Exam Vitals reviewed.  Constitutional:      General: He is not in acute distress.    Appearance: He is well-developed. He is obese.  HENT:     Head: Normocephalic and atraumatic.  Eyes:     General: No scleral icterus.    Conjunctiva/sclera: Conjunctivae normal.     Pupils: Pupils are equal, round, and reactive to light.  Neck:     Vascular: No JVD.     Trachea: No tracheal deviation.  Cardiovascular:     Rate and Rhythm: Normal rate and regular rhythm.     Heart sounds: Normal heart  sounds. No murmur heard. Pulmonary:     Effort: Pulmonary effort is normal. No tachypnea, accessory muscle usage or respiratory distress.     Breath sounds: No stridor. No wheezing, rhonchi or rales.  Abdominal:     General: There is no distension.     Palpations: Abdomen is soft.     Tenderness: There is no abdominal tenderness.     Comments: Obese pannus, soft  Musculoskeletal:        General: No tenderness.     Cervical back: Neck supple.  Lymphadenopathy:     Cervical: No cervical adenopathy.  Skin:    General: Skin is warm and dry.     Capillary Refill: Capillary refill takes less than 2 seconds.     Findings: No rash.  Neurological:     Mental Status: He is alert and oriented to person, place, and time.  Psychiatric:        Behavior: Behavior normal.     Vitals:   07/07/21 1552  BP: (!) 152/66  Pulse: 71  Temp: 97.7 F (36.5 C)  TempSrc: Oral  SpO2: 97%  Weight: 261 lb (118.4 kg)  Height: 6' (1.829 m)   97% on RA BMI Readings from Last 3 Encounters:   07/07/21 35.40 kg/m  06/26/21 36.74 kg/m  05/30/21 36.56 kg/m   Wt Readings from Last 3 Encounters:  07/07/21 261 lb (118.4 kg)  06/26/21 263 lb 6.4 oz (119.5 kg)  05/30/21 262 lb 1.6 oz (118.9 kg)     CBC    Component Value Date/Time   WBC 4.6 06/26/2021 1150   WBC 5.1 12/20/2020 0554   RBC 3.63 (L) 06/26/2021 1150   HGB 11.2 (L) 06/26/2021 1150   HCT 32.8 (L) 06/26/2021 1150   PLT 114 (L) 06/26/2021 1150   MCV 90.4 06/26/2021 1150   MCH 30.9 06/26/2021 1150   MCHC 34.1 06/26/2021 1150   RDW 13.9 06/26/2021 1150   LYMPHSABS 1.0 06/26/2021 1150   MONOABS 0.5 06/26/2021 1150   EOSABS 0.2 06/26/2021 1150   BASOSABS 0.1 06/26/2021 1150     Chest Imaging: June 2022 CT chest: 2.3 x 1.6 spiculated left upper lobe nodule concerning for primary bronchogenic carcinoma. The patient's images have been independently reviewed by me.    Pulmonary Functions Testing Results: PFT Results Latest Ref Rng & Units 12/09/2020  FVC-Pre L 3.65  FVC-Predicted Pre % 79  FVC-Post L 3.93  FVC-Predicted Post % 85  Pre FEV1/FVC % % 71  Post FEV1/FCV % % 70  FEV1-Pre L 2.60  FEV1-Predicted Pre % 76  FEV1-Post L 2.75  DLCO uncorrected ml/min/mmHg 25.72  DLCO UNC% % 96  DLCO corrected ml/min/mmHg 30.14  DLCO COR %Predicted % 113  DLVA Predicted % 108  TLC L 6.15  TLC % Predicted % 86  RV % Predicted % 90    FeNO:   Pathology:   Echocardiogram:   Heart Catheterization:     Assessment & Plan:     ICD-10-CM   1. Lung mass  R91.8     2. Malignant neoplasm of urinary bladder, unspecified site ([place])  C67.9     3. Malignant neoplasm of upper lobe of left lung ([facility])  C34.12     4. Former smoker  Z87.891       Discussion:  This is a 71 year old gentleman, bladder cancer, stage IV, small cell component with hepatic metastasis.  Also found to have secondary left lung cancer status post bronchoscopy.  Plan: Follow-up with medical  oncology. Was here today to make sure that  he is doing okay from a respiratory standpoint.  No major issues at this time. He is and let us know if his respiratory symptoms change.  If he has any issues from a breathing standpoint, develops recurrent effusion or any other complications related malignancy that we can be of assistance of. Feel free to reach out and let us know. Patient return to clinic in 6 months or as needed.   Current Outpatient Medications:    amLODipine (NORVASC) 5 MG tablet, Take 5 mg by mouth every morning., Disp: , Rfl:    diclofenac Sodium (VOLTAREN) 1 % GEL, Apply 1 application topically daily as needed (knee pain)., Disp: , Rfl:    lidocaine-prilocaine (EMLA) cream, Apply 1 application topically as needed., Disp: 30 g, Rfl: 0   lisinopril (ZESTRIL) 40 MG tablet, Take 40 mg by mouth every morning., Disp: , Rfl:    Menthol-Camphor (ICY HOT ADVANCED PAIN RELIEF) 16-11 % CREA, Apply 1 application topically daily as needed (pain)., Disp: , Rfl:    metoprolol succinate (TOPROL-XL) 100 MG 24 hr tablet, Take 100 mg by mouth every morning., Disp: , Rfl:    pravastatin (PRAVACHOL) 80 MG tablet, Take 80 mg by mouth at bedtime., Disp: , Rfl:    prochlorperazine (COMPAZINE) 10 MG tablet, Take 1 tablet (10 mg total) by mouth every 6 (six) hours as needed for nausea or vomiting., Disp: 30 tablet, Rfl: 0   tamsulosin (FLOMAX) 0.4 MG CAPS capsule, Take 0.4 mg by mouth daily., Disp: , Rfl:    Garner Nash, DO [facility] 07/07/2021 4:03 PM

## 2021-07-07 NOTE — Patient Instructions (Signed)
Thank you for visiting Dr. Valeta Harms at [facility]. Today we recommend the following:  Continue follow up with medical oncology   Return in about 6 months (around 01/04/2022) for with Eric Form, NP, or Dr. Valeta Harms.    Please do your part to reduce the spread of COVID-19.

## 2021-07-11 ENCOUNTER — Telehealth: Payer: Self-pay

## 2021-07-11 NOTE — Telephone Encounter (Signed)
Patient notified of completion of Cancer Claim Form. Copy of form mailed to Patient as requested.

## 2021-07-18 ENCOUNTER — Telehealth: Payer: Self-pay | Admitting: Oncology

## 2021-07-18 NOTE — Telephone Encounter (Signed)
Scheduled per 01/11 los, patient has been called and voicemail was left.

## 2021-07-25 ENCOUNTER — Inpatient Hospital Stay: Payer: Medicare Other | Attending: Oncology

## 2021-07-25 ENCOUNTER — Inpatient Hospital Stay: Payer: Medicare Other

## 2021-07-25 ENCOUNTER — Other Ambulatory Visit: Payer: Self-pay

## 2021-07-25 ENCOUNTER — Inpatient Hospital Stay (HOSPITAL_BASED_OUTPATIENT_CLINIC_OR_DEPARTMENT_OTHER): Payer: Medicare Other | Admitting: Oncology

## 2021-07-25 VITALS — BP 136/65 | HR 64 | Temp 97.3°F | Resp 18 | Wt 265.1 lb

## 2021-07-25 DIAGNOSIS — C787 Secondary malignant neoplasm of liver and intrahepatic bile duct: Secondary | ICD-10-CM | POA: Insufficient documentation

## 2021-07-25 DIAGNOSIS — Z95828 Presence of other vascular implants and grafts: Secondary | ICD-10-CM | POA: Diagnosis not present

## 2021-07-25 DIAGNOSIS — C678 Malignant neoplasm of overlapping sites of bladder: Secondary | ICD-10-CM

## 2021-07-25 DIAGNOSIS — C679 Malignant neoplasm of bladder, unspecified: Secondary | ICD-10-CM | POA: Insufficient documentation

## 2021-07-25 DIAGNOSIS — Z5112 Encounter for antineoplastic immunotherapy: Secondary | ICD-10-CM | POA: Diagnosis not present

## 2021-07-25 LAB — CBC WITH DIFFERENTIAL (CANCER CENTER ONLY)
Abs Immature Granulocytes: 0 10*3/uL (ref 0.00–0.07)
Basophils Absolute: 0.1 10*3/uL (ref 0.0–0.1)
Basophils Relative: 1 %
Eosinophils Absolute: 0.2 10*3/uL (ref 0.0–0.5)
Eosinophils Relative: 4 %
HCT: 32.2 % — ABNORMAL LOW (ref 39.0–52.0)
Hemoglobin: 10.9 g/dL — ABNORMAL LOW (ref 13.0–17.0)
Immature Granulocytes: 0 %
Lymphocytes Relative: 18 %
Lymphs Abs: 0.9 10*3/uL (ref 0.7–4.0)
MCH: 30.9 pg (ref 26.0–34.0)
MCHC: 33.9 g/dL (ref 30.0–36.0)
MCV: 91.2 fL (ref 80.0–100.0)
Monocytes Absolute: 0.4 10*3/uL (ref 0.1–1.0)
Monocytes Relative: 8 %
Neutro Abs: 3.3 10*3/uL (ref 1.7–7.7)
Neutrophils Relative %: 69 %
Platelet Count: 109 10*3/uL — ABNORMAL LOW (ref 150–400)
RBC: 3.53 MIL/uL — ABNORMAL LOW (ref 4.22–5.81)
RDW: 14.3 % (ref 11.5–15.5)
WBC Count: 4.8 10*3/uL (ref 4.0–10.5)
nRBC: 0 % (ref 0.0–0.2)

## 2021-07-25 LAB — CMP (CANCER CENTER ONLY)
ALT: 22 U/L (ref 0–44)
AST: 29 U/L (ref 15–41)
Albumin: 4.1 g/dL (ref 3.5–5.0)
Alkaline Phosphatase: 47 U/L (ref 38–126)
Anion gap: 7 (ref 5–15)
BUN: 18 mg/dL (ref 8–23)
CO2: 26 mmol/L (ref 22–32)
Calcium: 9.4 mg/dL (ref 8.9–10.3)
Chloride: 104 mmol/L (ref 98–111)
Creatinine: 1.14 mg/dL (ref 0.61–1.24)
GFR, Estimated: >60 mL/min (ref >60)
Glucose, Bld: 153 mg/dL — ABNORMAL HIGH (ref 70–99)
Potassium: 3.8 mmol/L (ref 3.5–5.1)
Sodium: 137 mmol/L (ref 135–145)
Total Bilirubin: 0.5 mg/dL (ref 0.3–1.2)
Total Protein: 7.5 g/dL (ref 6.5–8.1)

## 2021-07-25 LAB — MAGNESIUM: Magnesium: 1.7 mg/dL (ref 1.7–2.4)

## 2021-07-25 MED ORDER — SODIUM CHLORIDE 0.9% FLUSH
10.0000 mL | INTRAVENOUS | Status: DC | PRN
  Administered 2021-07-25: 10 mL

## 2021-07-25 MED ORDER — SODIUM CHLORIDE 0.9 % IV SOLN: Freq: Once | INTRAVENOUS | Status: AC

## 2021-07-25 MED ORDER — SODIUM CHLORIDE 0.9% FLUSH
10.0000 mL | Freq: Once | INTRAVENOUS | Status: AC
  Administered 2021-07-25: 10 mL

## 2021-07-25 MED ORDER — HEPARIN SOD (PORK) LOCK FLUSH 100 UNIT/ML IV SOLN
500.0000 [IU] | Freq: Once | INTRAVENOUS | Status: AC | PRN
  Administered 2021-07-25: 500 [IU]

## 2021-07-25 MED ORDER — SODIUM CHLORIDE 0.9 % IV SOLN
480.0000 mg | Freq: Once | INTRAVENOUS | Status: AC
  Administered 2021-07-25: 480 mg via INTRAVENOUS
  Filled 2021-07-25: qty 48

## 2021-07-25 NOTE — Patient Instructions (Signed)
[facility]  Discharge Instructions: ?Thank you for choosing [facility] to provide your oncology and hematology care.  ? ?If you have a lab appointment with the Cancer Center, please go directly to the Cancer Center and check in at the registration area. ?  ?Wear comfortable clothing and clothing appropriate for easy access to any Portacath or PICC line.  ? ?We strive to give you quality time with your provider. You may need to reschedule your appointment if you arrive late (15 or more minutes).  Arriving late affects you and other patients whose appointments are after yours.  Also, if you miss three or more appointments without notifying the office, you may be dismissed from the clinic at the provider?s discretion.    ?  ?For prescription refill requests, have your pharmacy contact our office and allow 72 hours for refills to be completed.   ? ?Today you received the following chemotherapy and/or immunotherapy agents Opdivo    ?  ?To help prevent nausea and vomiting after your treatment, we encourage you to take your nausea medication as directed. ? ?BELOW ARE SYMPTOMS THAT SHOULD BE REPORTED IMMEDIATELY: ?*FEVER GREATER THAN 100.4 F (38 ?C) OR HIGHER ?*CHILLS OR SWEATING ?*NAUSEA AND VOMITING THAT IS NOT CONTROLLED WITH YOUR NAUSEA MEDICATION ?*UNUSUAL SHORTNESS OF BREATH ?*UNUSUAL BRUISING OR BLEEDING ?*URINARY PROBLEMS (pain or burning when urinating, or frequent urination) ?*BOWEL PROBLEMS (unusual diarrhea, constipation, pain near the anus) ?TENDERNESS IN MOUTH AND THROAT WITH OR WITHOUT PRESENCE OF ULCERS (sore throat, sores in mouth, or a toothache) ?UNUSUAL RASH, SWELLING OR PAIN  ?UNUSUAL VAGINAL DISCHARGE OR ITCHING  ? ?Items with * indicate a potential emergency and should be followed up as soon as possible or go to the Emergency Department if any problems should occur. ? ?Please show the CHEMOTHERAPY ALERT CARD or IMMUNOTHERAPY ALERT CARD at check-in to the  Emergency Department and triage nurse. ? ?Should you have questions after your visit or need to cancel or reschedule your appointment, please contact [facility]  Dept: [phone]  and follow the prompts.  Office hours are 8:00 a.m. to 4:30 p.m. Monday - Friday. Please note that voicemails left after 4:00 p.m. may not be returned until the following business day.  We are closed weekends and major holidays. You have access to a nurse at all times for urgent questions. Please call the main number to the clinic Dept: [phone] and follow the prompts. ? ? ?For any non-urgent questions, you may also contact your provider using MyChart. We now offer e-Visits for anyone 18 and older to request care online for non-urgent symptoms. For details visit [email or web address]. ?  ?Also download the MyChart app! Go to the app store, search "MyChart", open the app, select [place], and log in with your MyChart username and password. ? ?Due to Covid, a mask is required upon entering the hospital/clinic. If you do not have a mask, one will be given to you upon arrival. For doctor visits, patients may have 1 support person aged 18 or older with them. For treatment visits, patients cannot have anyone with them due to current Covid guidelines and our immunocompromised population.  ? ?

## 2021-07-25 NOTE — Progress Notes (Signed)
Hematology and Oncology Follow Up Visit  Javier Sellers [id number] June 24, 1950 71 y.o. 07/25/2021 12:51 PM Javier Sellers Javier Sellers, MDRedding, Javier Blonder, MD   Principle Diagnosis: 71 year old man with bladder cancer diagnosed in 2022.  He was found to have stage IV high-grade urothelial carcinoma with hepatic metastasis.  Secondary diagnosis: Left lung neoplasm consistent with non-small cell lung cancer diagnosed in July 2022.  Prior Therapy: He underwent TURBT on November 13, 2020 and the final pathology revealed high-grade urothelial carcinoma with small cell component of 70%.  Chemotherapy utilizing gemcitabine and cisplatin started on December 27, 2020.  He completed 6 cycles of therapy and October 2022.  Current therapy: Nivolumab 480 mg monthly started on May 02, 2021.  He returns today for subsequent cycle of therapy.  Interim History: Javier Sellers is here for a follow-up visit.  Since last visit, he reports developing sensory neuropathy in his fingers and toes.  He was prescribed Neurontin by his primary care physician which has helped his symptoms.  He is currently taking 300 mg at nighttime which have helped his symptoms.  He denies any complications related to nivolumab at this time.  He denies any nausea, fatigue or shortness of breath.  He denies any diarrhea or weight loss.     Medications: Updated on review. Current Outpatient Medications  Medication Sig Dispense Refill   amLODipine (NORVASC) 5 MG tablet Take 5 mg by mouth every morning.     diclofenac Sodium (VOLTAREN) 1 % GEL Apply 1 application topically daily as needed (knee pain).     lidocaine-prilocaine (EMLA) cream Apply 1 application topically as needed. 30 g 0   lisinopril (ZESTRIL) 40 MG tablet Take 40 mg by mouth every morning.     Menthol-Camphor (ICY HOT ADVANCED PAIN RELIEF) 16-11 % CREA Apply 1 application topically daily as needed (pain).     metoprolol succinate (TOPROL-XL) 100 MG 24 hr tablet Take 100 mg by mouth every  morning.     pravastatin (PRAVACHOL) 80 MG tablet Take 80 mg by mouth at bedtime.     prochlorperazine (COMPAZINE) 10 MG tablet Take 1 tablet (10 mg total) by mouth every 6 (six) hours as needed for nausea or vomiting. 30 tablet 0   tamsulosin (FLOMAX) 0.4 MG CAPS capsule Take 0.4 mg by mouth daily.     No current facility-administered medications for this visit.     Allergies: No Known Allergies   Physical Exam:     Blood pressure 136/65, pulse 64, temperature (!) 97.3 F (36.3 C), resp. rate 18, weight 265 lb 1.6 oz (120.2 kg), SpO2 98 %.      ECOG: 1    General appearance: Alert, awake without any distress. Head: Atraumatic without abnormalities Oropharynx: Without any thrush or ulcers. Eyes: No scleral icterus. Lymph nodes: No lymphadenopathy noted in the cervical, supraclavicular, or axillary nodes Heart:regular rate and rhythm, without any murmurs or gallops.   Lung: Clear to auscultation without any rhonchi, wheezes or dullness to percussion. Abdomin: Soft, nontender without any shifting dullness or ascites. Musculoskeletal: No clubbing or cyanosis. Neurological: No motor or sensory deficits. Skin: No rashes or lesions.         Lab Results: Lab Results  Component Value Date   WBC 4.6 06/26/2021   HGB 11.2 (L) 06/26/2021   HCT 32.8 (L) 06/26/2021   MCV 90.4 06/26/2021   PLT 114 (L) 06/26/2021     Chemistry      Component Value Date/Time   NA 136  06/26/2021 1150   K 3.8 06/26/2021 1150   CL 103 06/26/2021 1150   CO2 23 06/26/2021 1150   BUN 23 06/26/2021 1150   CREATININE 1.08 06/26/2021 1150      Component Value Date/Time   CALCIUM 9.4 06/26/2021 1150   ALKPHOS 55 06/26/2021 1150   AST 21 06/26/2021 1150   ALT 17 06/26/2021 1150   BILITOT 0.6 06/26/2021 1150         Impression and Plan:  71 year old man with:  1. Stage IV high-grade urothelial carcinoma of the bladder diagnosed in 2022.  He achieved a excellent response to  chemotherapy and currently on nivolumab.  Risks and benefits of continuing this treatment were reviewed at this time.  PET scan obtained in December 2022 confirmed excellent disease control.  The plan is to update his staging scans in 2 months.  He is agreeable to proceed at this time.        2.  IV access: Port-A-Cath continues to be in use without any issues.   3.  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  He is currently on gabapentin 300 mg at nighttime which is helping his symptoms.  I agree with continuing at this time and will consider increasing the dose if needed in the future.   4.  Renal function surveillance: Kidney function remains normal after platinum based therapy.   5.  Goals of care: His disease is incurable although aggressive measures are warranted at this time.   6.  Lung neoplasm: We will continue to monitor on subsequent scans and consider definitive therapy if no evidence of metastatic bladder cancer is detected.    7.  Immune mediated complications: He has not experienced any complications at this time.  He does include pneumonitis, colitis and thyroid disease.  He has not experienced any at this time.   8.  Follow-up: He will return in 1 month for follow-up evaluation.   30  minutes were spent on this encounter.  The time was dedicated to reviewing laboratory data, disease status update and outlining future plan of care.    Zola Button, MD 2/10/202312:51 PM

## 2021-08-15 DIAGNOSIS — I10 Essential (primary) hypertension: Secondary | ICD-10-CM | POA: Diagnosis not present

## 2021-08-15 DIAGNOSIS — Z6835 Body mass index (BMI) 35.0-35.9, adult: Secondary | ICD-10-CM | POA: Diagnosis not present

## 2021-08-15 DIAGNOSIS — D510 Vitamin B12 deficiency anemia due to intrinsic factor deficiency: Secondary | ICD-10-CM | POA: Diagnosis not present

## 2021-08-15 DIAGNOSIS — E7800 Pure hypercholesterolemia, unspecified: Secondary | ICD-10-CM | POA: Diagnosis not present

## 2021-08-15 DIAGNOSIS — E039 Hypothyroidism, unspecified: Secondary | ICD-10-CM | POA: Diagnosis not present

## 2021-08-22 ENCOUNTER — Inpatient Hospital Stay: Payer: Medicare Other | Attending: Oncology

## 2021-08-22 ENCOUNTER — Inpatient Hospital Stay (HOSPITAL_BASED_OUTPATIENT_CLINIC_OR_DEPARTMENT_OTHER): Payer: Medicare Other | Admitting: Oncology

## 2021-08-22 ENCOUNTER — Other Ambulatory Visit: Payer: Self-pay

## 2021-08-22 ENCOUNTER — Inpatient Hospital Stay: Payer: Medicare Other

## 2021-08-22 VITALS — BP 130/69 | HR 85 | Temp 97.1°F | Resp 19 | Ht 72.0 in | Wt 259.8 lb

## 2021-08-22 DIAGNOSIS — C679 Malignant neoplasm of bladder, unspecified: Secondary | ICD-10-CM | POA: Insufficient documentation

## 2021-08-22 DIAGNOSIS — C678 Malignant neoplasm of overlapping sites of bladder: Secondary | ICD-10-CM

## 2021-08-22 DIAGNOSIS — Z79899 Other long term (current) drug therapy: Secondary | ICD-10-CM | POA: Diagnosis not present

## 2021-08-22 DIAGNOSIS — Z95828 Presence of other vascular implants and grafts: Secondary | ICD-10-CM

## 2021-08-22 DIAGNOSIS — C787 Secondary malignant neoplasm of liver and intrahepatic bile duct: Secondary | ICD-10-CM | POA: Insufficient documentation

## 2021-08-22 DIAGNOSIS — Z5112 Encounter for antineoplastic immunotherapy: Secondary | ICD-10-CM | POA: Diagnosis not present

## 2021-08-22 LAB — CMP (CANCER CENTER ONLY)
ALT: 20 U/L (ref 0–44)
AST: 24 U/L (ref 15–41)
Albumin: 4.2 g/dL (ref 3.5–5.0)
Alkaline Phosphatase: 55 U/L (ref 38–126)
Anion gap: 8 (ref 5–15)
BUN: 14 mg/dL (ref 8–23)
CO2: 26 mmol/L (ref 22–32)
Calcium: 9.9 mg/dL (ref 8.9–10.3)
Chloride: 103 mmol/L (ref 98–111)
Creatinine: 1.13 mg/dL (ref 0.61–1.24)
GFR, Estimated: >60 mL/min (ref >60)
Glucose, Bld: 176 mg/dL — ABNORMAL HIGH (ref 70–99)
Potassium: 3.7 mmol/L (ref 3.5–5.1)
Sodium: 137 mmol/L (ref 135–145)
Total Bilirubin: 0.7 mg/dL (ref 0.3–1.2)
Total Protein: 7.5 g/dL (ref 6.5–8.1)

## 2021-08-22 LAB — CBC WITH DIFFERENTIAL (CANCER CENTER ONLY)
Abs Immature Granulocytes: 0.01 10*3/uL (ref 0.00–0.07)
Basophils Absolute: 0 10*3/uL (ref 0.0–0.1)
Basophils Relative: 1 %
Eosinophils Absolute: 0.2 10*3/uL (ref 0.0–0.5)
Eosinophils Relative: 3 %
HCT: 34.5 % — ABNORMAL LOW (ref 39.0–52.0)
Hemoglobin: 11.9 g/dL — ABNORMAL LOW (ref 13.0–17.0)
Immature Granulocytes: 0 %
Lymphocytes Relative: 12 %
Lymphs Abs: 0.7 10*3/uL (ref 0.7–4.0)
MCH: 31.1 pg (ref 26.0–34.0)
MCHC: 34.5 g/dL (ref 30.0–36.0)
MCV: 90.1 fL (ref 80.0–100.0)
Monocytes Absolute: 0.4 10*3/uL (ref 0.1–1.0)
Monocytes Relative: 6 %
Neutro Abs: 4.6 10*3/uL (ref 1.7–7.7)
Neutrophils Relative %: 78 %
Platelet Count: 124 10*3/uL — ABNORMAL LOW (ref 150–400)
RBC: 3.83 MIL/uL — ABNORMAL LOW (ref 4.22–5.81)
RDW: 13.6 % (ref 11.5–15.5)
WBC Count: 6 10*3/uL (ref 4.0–10.5)
nRBC: 0 % (ref 0.0–0.2)

## 2021-08-22 LAB — TSH: TSH: 9.752 u[IU]/mL — ABNORMAL HIGH (ref 0.320–4.118)

## 2021-08-22 MED ORDER — SODIUM CHLORIDE 0.9% FLUSH
10.0000 mL | Freq: Once | INTRAVENOUS | Status: AC
  Administered 2021-08-22: 10 mL

## 2021-08-22 MED ORDER — SODIUM CHLORIDE 0.9% FLUSH
10.0000 mL | INTRAVENOUS | Status: DC | PRN
  Administered 2021-08-22: 10 mL

## 2021-08-22 MED ORDER — HEPARIN SOD (PORK) LOCK FLUSH 100 UNIT/ML IV SOLN
500.0000 [IU] | Freq: Once | INTRAVENOUS | Status: AC | PRN
  Administered 2021-08-22: 500 [IU]

## 2021-08-22 MED ORDER — SODIUM CHLORIDE 0.9 % IV SOLN: Freq: Once | INTRAVENOUS | Status: AC

## 2021-08-22 MED ORDER — SODIUM CHLORIDE 0.9 % IV SOLN
480.0000 mg | Freq: Once | INTRAVENOUS | Status: AC
  Administered 2021-08-22: 480 mg via INTRAVENOUS
  Filled 2021-08-22: qty 48

## 2021-08-22 NOTE — Progress Notes (Signed)
Hematology and Oncology Follow Up Visit ? ?Javier Sellers ?[id number] ?30-Oct-1950 71 y.o. ?08/22/2021 1:02 PM ?[name], MDRedding, [name], MD  ? ?Principle Diagnosis: 70 year old man with stage IV high-grade urothelial carcinoma of the bladder with hepatic metastasis diagnosed in 2022. ? ?Secondary diagnosis: Left lung neoplasm consistent with non-small cell lung cancer diagnosed in July 2022. ? ?Prior Therapy: He underwent TURBT on November 13, 2020 and the final pathology revealed high-grade urothelial carcinoma with small cell component of 70%. ? ?Chemotherapy utilizing gemcitabine and cisplatin started on December 27, 2020.  He completed 6 cycles of therapy and October 2022. ? ?Current therapy: Nivolumab 480 mg monthly started on May 02, 2021.  He is here for the next cycle of therapy. ? ?Interim History: Javier Sellers returns today for repeat evaluation.  Since last visit, he reports no major changes in his health.  He denies any complications related to chronic nivolumab therapy.  He denies any nausea, fatigue or changes in his performance status.  He denies any cough or wheezing.  His respiratory status remains stable.  He did have loose bowel habits but do not exceed 2-3 times a day in the last few days. ? ? ? ? ?Medications: Reviewed without changes. ?Current Outpatient Medications  ?Medication Sig Dispense Refill  ? amLODipine (NORVASC) 5 MG tablet Take 5 mg by mouth every morning.    ? diclofenac Sodium (VOLTAREN) 1 % GEL Apply 1 application topically daily as needed (knee pain).    ? lidocaine-prilocaine (EMLA) cream Apply 1 application topically as needed. 30 g 0  ? lisinopril (ZESTRIL) 40 MG tablet Take 40 mg by mouth every morning.    ? Menthol-Camphor (ICY HOT ADVANCED PAIN RELIEF) 16-11 % CREA Apply 1 application topically daily as needed (pain).    ? metoprolol succinate (TOPROL-XL) 100 MG 24 hr tablet Take 100 mg by mouth every morning.    ? pravastatin (PRAVACHOL) 80 MG tablet Take 80 mg by  mouth at bedtime.    ? prochlorperazine (COMPAZINE) 10 MG tablet Take 1 tablet (10 mg total) by mouth every 6 (six) hours as needed for nausea or vomiting. 30 tablet 0  ? tamsulosin (FLOMAX) 0.4 MG CAPS capsule Take 0.4 mg by mouth daily.    ? ?No current facility-administered medications for this visit.  ? ? ? ?Allergies: No Known Allergies ? ? ?Physical Exam: ? ? ? ? ? ?Blood pressure 130/69, pulse 85, temperature (!) 97.1 ?F (36.2 ?C), temperature source Tympanic, resp. rate 19, height 6' (1.829 m), weight 259 lb 12.8 oz (117.8 kg), SpO2 99 %. ? ? ? ? ? ?ECOG: 1 ? ? ?General appearance: Comfortable appearing without any discomfort ?Head: Normocephalic without any trauma ?Oropharynx: Mucous membranes are moist and pink without any thrush or ulcers. ?Eyes: Pupils are equal and round reactive to light. ?Lymph nodes: No cervical, supraclavicular, inguinal or axillary lymphadenopathy.   ?Heart:regular rate and rhythm.  S1 and S2 without leg edema. ?Lung: Clear without any rhonchi or wheezes.  No dullness to percussion. ?Abdomin: Soft, nontender, nondistended with good bowel sounds.  No hepatosplenomegaly. ?Musculoskeletal: No joint deformity or effusion.  Full range of motion noted. ?Neurological: No deficits noted on motor, sensory and deep tendon reflex exam. ?Skin: No petechial rash or dryness.  Appeared moist.  ? ? ? ? ? ? ? ? ?Lab Results: ?Lab Results  ?Component Value Date  ? WBC 4.8 07/25/2021  ? HGB 10.9 (L) 07/25/2021  ? HCT 32.2 (L) 07/25/2021  ?  MCV 91.2 07/25/2021  ? PLT 109 (L) 07/25/2021  ? ?  Chemistry   ?   ?Component Value Date/Time  ? NA 137 07/25/2021 1332  ? K 3.8 07/25/2021 1332  ? CL 104 07/25/2021 1332  ? CO2 26 07/25/2021 1332  ? BUN 18 07/25/2021 1332  ? CREATININE 1.14 07/25/2021 1332  ?    ?Component Value Date/Time  ? CALCIUM 9.4 07/25/2021 1332  ? ALKPHOS 47 07/25/2021 1332  ? AST 29 07/25/2021 1332  ? ALT 22 07/25/2021 1332  ? BILITOT 0.5 07/25/2021 1332  ?  ? ? ? ? ? ?Impression and  Plan: ? ?71 year old man with: ? ?1.  Bladder cancer diagnosed in 2022.  He was found to have stage IV high-grade urothelial carcinoma with hepatic involvement. ? ?His disease status was updated at this time and treatment choices were reviewed.  He continues to be on nivolumab immunotherapy without any recent complaints.  His PET scan in December 2022 showed near resolution of his hepatic metastasis and improvement in his lung lesion.  Plan is to update his imaging studies in April 2023.  The plan is to continue with nivolumab therapy for the time being and he is agreeable.  ? ? ? ?  ?  ?2.  IV access: Port-A-Cath remains in use without any issues. ?  ?3.  Neuropathy: Manageable at this time with Neurontin and is predominantly in his upper and lower extremities.  His neuropathy currently grade 1 and not interfering with function. ? ?4.  Renal function surveillance: His kidney function remains within normal range without any decline. ?  ?5.  Goals of care: Therapy remains palliative although aggressive measures are warranted given his excellent performance status and response. ?  ?6.  Lung neoplasm: Given his advanced bladder cancer, we will continue to monitor and will receive consolidative local therapy for any residual disease ? ? ? ?7.  Immune mediated complications: No complications noted at this time.  These include pneumonitis, colitis and thyroid disease. ? ? ?8.  Follow-up: In 4 weeks for repeat follow-up.. ? ? ?30  minutes were dedicated to this visit.  The time was dedicated to reviewing his disease status, reviewing imaging studies and outlining future plan of care discussion. ? ? ?Zola Button, MD ?3/10/20231:02 PM ? ?

## 2021-08-22 NOTE — Patient Instructions (Signed)
[facility]COL[facility]  Discharge Instructions: ?Thank you for choosing [facility] to provide your oncology and hematology care.  ? ?If you have a lab appointment with the Cancer Center, please go directly to the Cancer Center and check in at the registration area. ?  ?Wear comfortable clothing and clothing appropriate for easy access to any Portacath or PICC line.  ? ?We strive to give you quality time with your provider. You may need to reschedule your appointment if you arrive late (15 or more minutes).  Arriving late affects you and other patients whose appointments are after yours.  Also, if you miss three or more appointments without notifying the office, you may be dismissed from the clinic at the provider?s discretion.    ?  ?For prescription refill requests, have your pharmacy contact our office and allow 72 hours for refills to be completed.   ? ?Today you received the following chemotherapy and/or immunotherapy agents Opdivo    ?  ?To help prevent nausea and vomiting after your treatment, we encourage you to take your nausea medication as directed. ? ?BELOW ARE SYMPTOMS THAT SHOULD BE REPORTED IMMEDIATELY: ?*FEVER GREATER THAN 100.4 F (38 ?C) OR HIGHER ?*CHILLS OR SWEATING ?*NAUSEA AND VOMITING THAT IS NOT CONTROLLED WITH YOUR NAUSEA MEDICATION ?*UNUSUAL SHORTNESS OF BREATH ?*UNUSUAL BRUISING OR BLEEDING ?*URINARY PROBLEMS (pain or burning when urinating, or frequent urination) ?*BOWEL PROBLEMS (unusual diarrhea, constipation, pain near the anus) ?TENDERNESS IN MOUTH AND THROAT WITH OR WITHOUT PRESENCE OF ULCERS (sore throat, sores in mouth, or a toothache) ?UNUSUAL RASH, SWELLING OR PAIN  ?UNUSUAL VAGINAL DISCHARGE OR ITCHING  ? ?Items with * indicate a potential emergency and should be followed up as soon as possible or go to the Emergency Department if any problems should occur. ? ?Please show the CHEMOTHERAPY ALERT CARD or IMMUNOTHERAPY ALERT CARD at check-in to the  Emergency Department and triage nurse. ? ?Should you have questions after your visit or need to cancel or reschedule your appointment, please contact [facility]  Dept: [phone]  and follow the prompts.  Office hours are 8:00 a.m. to 4:30 p.m. Monday - Friday. Please note that voicemails left after 4:00 p.m. may not be returned until the following business day.  We are closed weekends and major holidays. You have access to a nurse at all times for urgent questions. Please call the main number to the clinic Dept: [phone] and follow the prompts. ? ? ?For any non-urgent questions, you may also contact your provider using MyChart. We now offer e-Visits for anyone 18 and older to request care online for non-urgent symptoms. For details visit [email or web address]. ?  ?Also download the MyChart app! Go to the app store, search "MyChart", open the app, select [facility], and log in with your MyChart username and password. ? ?Due to Covid, a mask is required upon entering the hospital/clinic. If you do not have a mask, one will be given to you upon arrival. For doctor visits, patients may have 1 support person aged 18 or older with them. For treatment visits, patients cannot have anyone with them due to current Covid guidelines and our immunocompromised population.  ? ?

## 2021-08-27 DIAGNOSIS — J0100 Acute maxillary sinusitis, unspecified: Secondary | ICD-10-CM | POA: Diagnosis not present

## 2021-08-27 DIAGNOSIS — J209 Acute bronchitis, unspecified: Secondary | ICD-10-CM | POA: Diagnosis not present

## 2021-09-12 DIAGNOSIS — E7800 Pure hypercholesterolemia, unspecified: Secondary | ICD-10-CM | POA: Diagnosis not present

## 2021-09-12 DIAGNOSIS — E039 Hypothyroidism, unspecified: Secondary | ICD-10-CM | POA: Diagnosis not present

## 2021-09-12 DIAGNOSIS — I10 Essential (primary) hypertension: Secondary | ICD-10-CM | POA: Diagnosis not present

## 2021-09-15 ENCOUNTER — Ambulatory Visit (HOSPITAL_COMMUNITY)
Admission: RE | Admit: 2021-09-15 | Discharge: 2021-09-15 | Disposition: A | Discharge status: Home / Self Care | Payer: Medicare Other | Source: Ambulatory Visit | Attending: Oncology | Admitting: Oncology

## 2021-09-15 DIAGNOSIS — K5730 Diverticulosis of large intestine without perforation or abscess without bleeding: Secondary | ICD-10-CM | POA: Diagnosis not present

## 2021-09-15 DIAGNOSIS — K7689 Other specified diseases of liver: Secondary | ICD-10-CM | POA: Diagnosis not present

## 2021-09-15 DIAGNOSIS — I2510 Atherosclerotic heart disease of native coronary artery without angina pectoris: Secondary | ICD-10-CM | POA: Diagnosis not present

## 2021-09-15 DIAGNOSIS — C679 Malignant neoplasm of bladder, unspecified: Secondary | ICD-10-CM | POA: Diagnosis not present

## 2021-09-15 DIAGNOSIS — C678 Malignant neoplasm of overlapping sites of bladder: Secondary | ICD-10-CM | POA: Insufficient documentation

## 2021-09-15 LAB — GLUCOSE, CAPILLARY: Glucose-Capillary: 137 mg/dL — ABNORMAL HIGH (ref 70–99)

## 2021-09-15 MED ORDER — FLUDEOXYGLUCOSE F - 18 (FDG) INJECTION
13.0000 | Freq: Once | INTRAVENOUS | Status: AC | PRN
  Administered 2021-09-15: 12.78 via INTRAVENOUS

## 2021-09-19 ENCOUNTER — Inpatient Hospital Stay (HOSPITAL_BASED_OUTPATIENT_CLINIC_OR_DEPARTMENT_OTHER): Payer: Medicare Other | Admitting: Oncology

## 2021-09-19 ENCOUNTER — Inpatient Hospital Stay: Payer: Medicare Other | Attending: Oncology

## 2021-09-19 ENCOUNTER — Inpatient Hospital Stay: Payer: Medicare Other

## 2021-09-19 ENCOUNTER — Other Ambulatory Visit: Payer: Self-pay

## 2021-09-19 VITALS — BP 143/82 | HR 74 | Temp 97.3°F | Resp 20 | Wt 260.3 lb

## 2021-09-19 DIAGNOSIS — C678 Malignant neoplasm of overlapping sites of bladder: Secondary | ICD-10-CM

## 2021-09-19 DIAGNOSIS — Z79899 Other long term (current) drug therapy: Secondary | ICD-10-CM | POA: Insufficient documentation

## 2021-09-19 DIAGNOSIS — C787 Secondary malignant neoplasm of liver and intrahepatic bile duct: Secondary | ICD-10-CM | POA: Insufficient documentation

## 2021-09-19 DIAGNOSIS — Z5112 Encounter for antineoplastic immunotherapy: Secondary | ICD-10-CM | POA: Diagnosis not present

## 2021-09-19 DIAGNOSIS — Z95828 Presence of other vascular implants and grafts: Secondary | ICD-10-CM

## 2021-09-19 DIAGNOSIS — C3490 Malignant neoplasm of unspecified part of unspecified bronchus or lung: Secondary | ICD-10-CM | POA: Diagnosis not present

## 2021-09-19 DIAGNOSIS — C679 Malignant neoplasm of bladder, unspecified: Secondary | ICD-10-CM | POA: Diagnosis not present

## 2021-09-19 DIAGNOSIS — G629 Polyneuropathy, unspecified: Secondary | ICD-10-CM | POA: Insufficient documentation

## 2021-09-19 LAB — CMP (CANCER CENTER ONLY)
ALT: 24 U/L (ref 0–44)
AST: 28 U/L (ref 15–41)
Albumin: 4.3 g/dL (ref 3.5–5.0)
Alkaline Phosphatase: 53 U/L (ref 38–126)
Anion gap: 11 (ref 5–15)
BUN: 13 mg/dL (ref 8–23)
CO2: 24 mmol/L (ref 22–32)
Calcium: 9.5 mg/dL (ref 8.9–10.3)
Chloride: 103 mmol/L (ref 98–111)
Creatinine: 1.13 mg/dL (ref 0.61–1.24)
GFR, Estimated: >60 mL/min (ref >60)
Glucose, Bld: 110 mg/dL — ABNORMAL HIGH (ref 70–99)
Potassium: 3.9 mmol/L (ref 3.5–5.1)
Sodium: 138 mmol/L (ref 135–145)
Total Bilirubin: 0.8 mg/dL (ref 0.3–1.2)
Total Protein: 8 g/dL (ref 6.5–8.1)

## 2021-09-19 LAB — CBC WITH DIFFERENTIAL (CANCER CENTER ONLY)
Abs Immature Granulocytes: 0.01 10*3/uL (ref 0.00–0.07)
Basophils Absolute: 0.1 10*3/uL (ref 0.0–0.1)
Basophils Relative: 1 %
Eosinophils Absolute: 0.2 10*3/uL (ref 0.0–0.5)
Eosinophils Relative: 5 %
HCT: 36.6 % — ABNORMAL LOW (ref 39.0–52.0)
Hemoglobin: 12.4 g/dL — ABNORMAL LOW (ref 13.0–17.0)
Immature Granulocytes: 0 %
Lymphocytes Relative: 20 %
Lymphs Abs: 1 10*3/uL (ref 0.7–4.0)
MCH: 30.8 pg (ref 26.0–34.0)
MCHC: 33.9 g/dL (ref 30.0–36.0)
MCV: 91 fL (ref 80.0–100.0)
Monocytes Absolute: 0.5 10*3/uL (ref 0.1–1.0)
Monocytes Relative: 9 %
Neutro Abs: 3.2 10*3/uL (ref 1.7–7.7)
Neutrophils Relative %: 65 %
Platelet Count: 121 10*3/uL — ABNORMAL LOW (ref 150–400)
RBC: 4.02 MIL/uL — ABNORMAL LOW (ref 4.22–5.81)
RDW: 14.2 % (ref 11.5–15.5)
WBC Count: 4.9 10*3/uL (ref 4.0–10.5)
nRBC: 0 % (ref 0.0–0.2)

## 2021-09-19 MED ORDER — SODIUM CHLORIDE 0.9% FLUSH
10.0000 mL | Freq: Once | INTRAVENOUS | Status: AC
  Administered 2021-09-19: 10 mL

## 2021-09-19 NOTE — Progress Notes (Signed)
Hematology and Oncology Follow Up Visit ? ?DMARCUS DECICCO ?[id number] ?April 11, 1951 71 y.o. ?09/19/2021 12:53 PM ?[name], MDRedding, [name], MD  ? ?Principle Diagnosis: 71 year old man with bladder cancer diagnosed in June 2022.  He was found to have stage IV high-grade urothelial carcinoma with hepatic metastasis. ? ?Secondary diagnosis: Left lung neoplasm consistent with non-small cell lung cancer diagnosed in July 2022. ? ?Prior Therapy: He underwent TURBT on November 13, 2020 and the final pathology revealed high-grade urothelial carcinoma with small cell component of 70%. ? ?Chemotherapy utilizing gemcitabine and cisplatin started on December 27, 2020.  He completed 6 cycles of therapy and October 2022. ? ?Current therapy: Nivolumab 480 mg monthly started on May 02, 2021.  Returns today for repeat infusion. ? ?Interim History: Mr. Gearheart presents today for a follow-up visit.  Since last visit, he reports no major changes in his health.  He has reported more pruritus and occasional dermatitis.  He denies any nausea, vomiting or abdominal pain.  He denies any excessive fatigue or tiredness.  He denies any skin rash. ? ? ? ? ?Medications: Updated on review. ?Current Outpatient Medications  ?Medication Sig Dispense Refill  ? amLODipine (NORVASC) 5 MG tablet Take 5 mg by mouth every morning.    ? diclofenac Sodium (VOLTAREN) 1 % GEL Apply 1 application topically daily as needed (knee pain).    ? lidocaine-prilocaine (EMLA) cream Apply 1 application topically as needed. 30 g 0  ? lisinopril (ZESTRIL) 40 MG tablet Take 40 mg by mouth every morning.    ? Menthol-Camphor (ICY HOT ADVANCED PAIN RELIEF) 16-11 % CREA Apply 1 application topically daily as needed (pain).    ? metoprolol succinate (TOPROL-XL) 100 MG 24 hr tablet Take 100 mg by mouth every morning.    ? pravastatin (PRAVACHOL) 80 MG tablet Take 80 mg by mouth at bedtime.    ? prochlorperazine (COMPAZINE) 10 MG tablet Take 1 tablet (10 mg total) by mouth  every 6 (six) hours as needed for nausea or vomiting. 30 tablet 0  ? tamsulosin (FLOMAX) 0.4 MG CAPS capsule Take 0.4 mg by mouth daily.    ? ?No current facility-administered medications for this visit.  ? ? ? ?Allergies: No Known Allergies ? ? ?Physical Exam: ? ? ? ? ? ? ?Blood pressure (!) 143/82, pulse 74, temperature (!) 97.3 ?F (36.3 ?C), resp. rate 20, weight 260 lb 4.8 oz (118.1 kg), SpO2 98 %. ? ? ? ? ? ?ECOG: 1 ? ? ?General appearance: Alert, awake without any distress. ?Head: Atraumatic without abnormalities ?Oropharynx: Without any thrush or ulcers. ?Eyes: No scleral icterus. ?Lymph nodes: No lymphadenopathy noted in the cervical, supraclavicular, or axillary nodes ?Heart:regular rate and rhythm, without any murmurs or gallops.   ?Lung: Clear to auscultation without any rhonchi, wheezes or dullness to percussion. ?Abdomin: Soft, nontender without any shifting dullness or ascites. ?Musculoskeletal: No clubbing or cyanosis. ?Neurological: No motor or sensory deficits. ?Skin: No rashes or lesions. ?P ? ? ? ? ? ? ? ? ?Lab Results: ?Lab Results  ?Component Value Date  ? WBC 6.0 08/22/2021  ? HGB 11.9 (L) 08/22/2021  ? HCT 34.5 (L) 08/22/2021  ? MCV 90.1 08/22/2021  ? PLT 124 (L) 08/22/2021  ? ?  Chemistry   ?   ?Component Value Date/Time  ? NA 137 08/22/2021 1307  ? K 3.7 08/22/2021 1307  ? CL 103 08/22/2021 1307  ? CO2 26 08/22/2021 1307  ? BUN 14 08/22/2021 1307  ?  CREATININE 1.13 08/22/2021 1307  ?    ?Component Value Date/Time  ? CALCIUM 9.9 08/22/2021 1307  ? ALKPHOS 55 08/22/2021 1307  ? AST 24 08/22/2021 1307  ? ALT 20 08/22/2021 1307  ? BILITOT 0.7 08/22/2021 1307  ?  ? ? ? ? ? ?Impression and Plan: ? ?71 year old man with: ? ?1.  Stage IV high-grade urothelial carcinoma of the bladder with hepatic involvement diagnosed in 2022. ? ?The natural course of his disease was reviewed at this time and imaging studies obtained on April 3 were reiterated.  He has disease progression at this time compared to  imaging studies in December 2022.  Treatment options at this time were discussed and I recommended switching his therapy to Padcev.  Complication associated with this therapy including neuropathy, hyperglycemia and others.  The plan is to proceed with nivolumab today and switch his therapy in the near future.  He is agreeable to proceed at this time.  We will discontinue nivolumab today. ? ? ? ?  ?  ?2.  IV access: Port-A-Cath continues to be flushed periodically. ?  ?3.  Neuropathy: Continues to have grade 1 neuropathy and will continue to monitor. ? ?4.  Renal function surveillance: Creatinine clearance remains within normal range.  We will continue to monitor on subsequent therapies. ?  ?5.  Goals of care: His disease is incurable although aggressive measures are warranted. ?  ?6.  Lung neoplasm: Continue to be overall stable.  His pulmonary adenopathy is likely related to his bladder cancer rather than lung neoplasm. ? ? ? ?7.  Immune mediated complications: Pneumonitis, colitis and thyroid disease were reiterated.  He is not experiencing any.  No delayed complications noted at this time. ? ? ?8.  Follow-up: We will be in the near future to start salvage therapy. ? ? ?30  minutes were spent on this encounter.  The time was dedicated to reviewing laboratory data, disease status update and outlining future plan of care discussion. ? ? ?Zola Button, MD ?4/7/202312:53 PM ? ?

## 2021-09-19 NOTE — Progress Notes (Signed)
DISCONTINUE OFF PATHWAY REGIMEN - Bladder ? ? ?[id number]:Nivolumab 480 mg IV D1 q28 Days: ?  A cycle is every 28 days: ?    Nivolumab  ? ?**Always confirm dose/schedule in your pharmacy ordering system** ? ?REASON: Disease Progression ?PRIOR TREATMENT: Off Pathway: Nivolumab 480 mg IV D1 q28 Days ?TREATMENT RESPONSE: Complete Response (CR) ? ?START ON PATHWAY REGIMEN - Bladder ? ? ?  A cycle is every 28 days: ?    Enfortumab vedotin-ejfv  ? ?**Always confirm dose/schedule in your pharmacy ordering system** ? ?Patient Characteristics: ?Advanced/Metastatic Disease, Second Line, FGFR2/FGFR3 Mutation Negative or Unknown, Prior/Ineligible for Platinum-Based Therapy and Prior/Ineligible for PD-1/PD-L1 Inhibitor ?Therapeutic Status: Advanced/Metastatic Disease ?Line of Therapy: Second Line ?NRWC1/JSCB8 Mutation Status: Did Not Order Test ?Intent of Therapy: ?Non-Curative / Palliative Intent, Discussed with Patient ?

## 2021-09-26 ENCOUNTER — Inpatient Hospital Stay: Payer: Medicare Other

## 2021-10-03 ENCOUNTER — Other Ambulatory Visit: Payer: Self-pay

## 2021-10-03 ENCOUNTER — Inpatient Hospital Stay: Payer: Medicare Other

## 2021-10-03 VITALS — BP 124/73 | HR 71 | Temp 97.6°F | Resp 18 | Ht 72.0 in | Wt 262.8 lb

## 2021-10-03 DIAGNOSIS — G629 Polyneuropathy, unspecified: Secondary | ICD-10-CM | POA: Diagnosis not present

## 2021-10-03 DIAGNOSIS — C3490 Malignant neoplasm of unspecified part of unspecified bronchus or lung: Secondary | ICD-10-CM | POA: Diagnosis not present

## 2021-10-03 DIAGNOSIS — C679 Malignant neoplasm of bladder, unspecified: Secondary | ICD-10-CM | POA: Diagnosis not present

## 2021-10-03 DIAGNOSIS — E039 Hypothyroidism, unspecified: Secondary | ICD-10-CM | POA: Diagnosis not present

## 2021-10-03 DIAGNOSIS — Z5112 Encounter for antineoplastic immunotherapy: Secondary | ICD-10-CM | POA: Diagnosis not present

## 2021-10-03 DIAGNOSIS — Z79899 Other long term (current) drug therapy: Secondary | ICD-10-CM | POA: Diagnosis not present

## 2021-10-03 DIAGNOSIS — Z95828 Presence of other vascular implants and grafts: Secondary | ICD-10-CM

## 2021-10-03 DIAGNOSIS — C787 Secondary malignant neoplasm of liver and intrahepatic bile duct: Secondary | ICD-10-CM | POA: Diagnosis not present

## 2021-10-03 DIAGNOSIS — D510 Vitamin B12 deficiency anemia due to intrinsic factor deficiency: Secondary | ICD-10-CM | POA: Diagnosis not present

## 2021-10-03 LAB — CMP (CANCER CENTER ONLY)
ALT: 29 U/L (ref 0–44)
AST: 37 U/L (ref 15–41)
Albumin: 4.1 g/dL (ref 3.5–5.0)
Alkaline Phosphatase: 45 U/L (ref 38–126)
Anion gap: 7 (ref 5–15)
BUN: 15 mg/dL (ref 8–23)
CO2: 25 mmol/L (ref 22–32)
Calcium: 9.2 mg/dL (ref 8.9–10.3)
Chloride: 104 mmol/L (ref 98–111)
Creatinine: 1.28 mg/dL — ABNORMAL HIGH (ref 0.61–1.24)
GFR, Estimated: >60 mL/min (ref >60)
Glucose, Bld: 149 mg/dL — ABNORMAL HIGH (ref 70–99)
Potassium: 3.9 mmol/L (ref 3.5–5.1)
Sodium: 136 mmol/L (ref 135–145)
Total Bilirubin: 0.6 mg/dL (ref 0.3–1.2)
Total Protein: 7.4 g/dL (ref 6.5–8.1)

## 2021-10-03 LAB — CBC WITH DIFFERENTIAL (CANCER CENTER ONLY)
Abs Immature Granulocytes: 0 10*3/uL (ref 0.00–0.07)
Basophils Absolute: 0.1 10*3/uL (ref 0.0–0.1)
Basophils Relative: 2 %
Eosinophils Absolute: 0.1 10*3/uL (ref 0.0–0.5)
Eosinophils Relative: 3 %
HCT: 34.4 % — ABNORMAL LOW (ref 39.0–52.0)
Hemoglobin: 11.8 g/dL — ABNORMAL LOW (ref 13.0–17.0)
Immature Granulocytes: 0 %
Lymphocytes Relative: 19 %
Lymphs Abs: 0.9 10*3/uL (ref 0.7–4.0)
MCH: 31.1 pg (ref 26.0–34.0)
MCHC: 34.3 g/dL (ref 30.0–36.0)
MCV: 90.5 fL (ref 80.0–100.0)
Monocytes Absolute: 0.5 10*3/uL (ref 0.1–1.0)
Monocytes Relative: 10 %
Neutro Abs: 3.2 10*3/uL (ref 1.7–7.7)
Neutrophils Relative %: 66 %
Platelet Count: 127 10*3/uL — ABNORMAL LOW (ref 150–400)
RBC: 3.8 MIL/uL — ABNORMAL LOW (ref 4.22–5.81)
RDW: 14.5 % (ref 11.5–15.5)
WBC Count: 4.7 10*3/uL (ref 4.0–10.5)
nRBC: 0 % (ref 0.0–0.2)

## 2021-10-03 MED ORDER — SODIUM CHLORIDE 0.9% FLUSH
10.0000 mL | INTRAVENOUS | Status: DC | PRN
  Administered 2021-10-03: 10 mL

## 2021-10-03 MED ORDER — HEPARIN SOD (PORK) LOCK FLUSH 100 UNIT/ML IV SOLN
500.0000 [IU] | Freq: Once | INTRAVENOUS | Status: AC | PRN
  Administered 2021-10-03: 500 [IU]

## 2021-10-03 MED ORDER — SODIUM CHLORIDE 0.9% FLUSH
10.0000 mL | Freq: Once | INTRAVENOUS | Status: AC
  Administered 2021-10-03: 10 mL

## 2021-10-03 MED ORDER — SODIUM CHLORIDE 0.9 % IV SOLN
100.0000 mg | Freq: Once | INTRAVENOUS | Status: AC
  Administered 2021-10-03: 100 mg via INTRAVENOUS
  Filled 2021-10-03: qty 6

## 2021-10-03 MED ORDER — PALONOSETRON HCL INJECTION 0.25 MG/5ML
0.2500 mg | Freq: Once | INTRAVENOUS | Status: AC
  Administered 2021-10-03: 0.25 mg via INTRAVENOUS
  Filled 2021-10-03: qty 5

## 2021-10-03 MED ORDER — SODIUM CHLORIDE 0.9 % IV SOLN: Freq: Once | INTRAVENOUS | Status: AC

## 2021-10-03 MED ORDER — SODIUM CHLORIDE 0.9 % IV SOLN
10.0000 mg | Freq: Once | INTRAVENOUS | Status: AC
  Administered 2021-10-03: 10 mg via INTRAVENOUS
  Filled 2021-10-03: qty 10

## 2021-10-03 NOTE — Progress Notes (Addendum)
Decrease Padcev to 100mg  (1 mg/kg, Cap weight at 100 kg).  MD will adjust patient schedule for 3 weeks on, 1 week off for Cycle 1. ? ?Acquanetta Belling, [place], BCPS, BCOP ?10/03/2021 ?1:45 PM ? ?

## 2021-10-03 NOTE — Patient Instructions (Signed)
[facility]  Discharge Instructions: ?Thank you for choosing [facility] to provide your oncology and hematology care.  ? ?If you have a lab appointment with the [facility], please go directly to the [facility] and check in at the registration area. ?  ?Wear comfortable clothing and clothing appropriate for easy access to any Portacath or PICC line.  ? ?We strive to give you quality time with your provider. You may need to reschedule your appointment if you arrive late (15 or more minutes).  Arriving late affects you and other patients whose appointments are after yours.  Also, if you miss three or more appointments without notifying the office, you may be dismissed from the clinic at the provider?s discretion.    ?  ?For prescription refill requests, have your pharmacy contact our office and allow 72 hours for refills to be completed.   ? ?Today you received the following chemotherapy and/or immunotherapy agents padcev    ?  ?To help prevent nausea and vomiting after your treatment, we encourage you to take your nausea medication as directed. ? ?BELOW ARE SYMPTOMS THAT SHOULD BE REPORTED IMMEDIATELY: ?*FEVER GREATER THAN 100.4 F (38 ?C) OR HIGHER ?*CHILLS OR SWEATING ?*NAUSEA AND VOMITING THAT IS NOT CONTROLLED WITH YOUR NAUSEA MEDICATION ?*UNUSUAL SHORTNESS OF BREATH ?*UNUSUAL BRUISING OR BLEEDING ?*URINARY PROBLEMS (pain or burning when urinating, or frequent urination) ?*BOWEL PROBLEMS (unusual diarrhea, constipation, pain near the anus) ?TENDERNESS IN MOUTH AND THROAT WITH OR WITHOUT PRESENCE OF ULCERS (sore throat, sores in mouth, or a toothache) ?UNUSUAL RASH, SWELLING OR PAIN  ?UNUSUAL VAGINAL DISCHARGE OR ITCHING  ? ?Items with * indicate a potential emergency and should be followed up as soon as possible or go to the Emergency Department if any problems should occur. ? ?Please show the CHEMOTHERAPY ALERT CARD or IMMUNOTHERAPY ALERT CARD at check-in to the  Emergency Department and triage nurse. ? ?Should you have questions after your visit or need to cancel or reschedule your appointment, please contact [facility]  Dept: [phone]  and follow the prompts.  Office hours are 8:00 a.m. to 4:30 p.m. Monday - Friday. Please note that voicemails left after 4:00 p.m. may not be returned until the following business day.  We are closed weekends and major holidays. You have access to a nurse at all times for urgent questions. Please call the main number to the clinic Dept: [phone] and follow the prompts. ? ? ?For any non-urgent questions, you may also contact your provider using MyChart. We now offer e-Visits for anyone 37 and older to request care online for non-urgent symptoms. For details visit [email or web address]. ?  ?Also download the MyChart app! Go to the app store, search "MyChart", open the app, select [facility], and log in with your MyChart username and password. ? ?Due to Covid, a mask is required upon entering the hospital/clinic. If you do not have a mask, one will be given to you upon arrival. For doctor visits, patients may have 1 support person aged 7 or older with them. For treatment visits, patients cannot have anyone with them due to current Covid guidelines and our immunocompromised population.  ? ?

## 2021-10-06 ENCOUNTER — Telehealth: Payer: Self-pay

## 2021-10-06 NOTE — Telephone Encounter (Signed)
-----   Message from Alvera Singh, RN sent at 10/03/2021  4:41 PM EDT ----- ?Regarding: First time Padcev- Dr. Alen Blew ?First time Padcev. Dr. Hazeline Junker patient- tolerated well. ? ?

## 2021-10-06 NOTE — Telephone Encounter (Signed)
Mr Benavides states that he is doing fine.  He feels a little tired today.  His face was flushed on Saturday. He was afebrile. Told him that the decadron given in the IV with his premeds can cause flushing in the face the day after treatment.  ?He is eating, drinking, and urinating well. ?He knows to call the office at [phone] if he has any questions or concerns. ?

## 2021-10-10 ENCOUNTER — Other Ambulatory Visit: Payer: Self-pay

## 2021-10-10 ENCOUNTER — Other Ambulatory Visit: Payer: Self-pay | Admitting: Oncology

## 2021-10-10 ENCOUNTER — Inpatient Hospital Stay: Payer: Medicare Other

## 2021-10-10 VITALS — BP 120/62 | HR 72 | Temp 98.2°F | Resp 18 | Wt 255.5 lb

## 2021-10-10 DIAGNOSIS — Z95828 Presence of other vascular implants and grafts: Secondary | ICD-10-CM

## 2021-10-10 DIAGNOSIS — C679 Malignant neoplasm of bladder, unspecified: Secondary | ICD-10-CM | POA: Diagnosis not present

## 2021-10-10 DIAGNOSIS — G629 Polyneuropathy, unspecified: Secondary | ICD-10-CM | POA: Diagnosis not present

## 2021-10-10 DIAGNOSIS — Z5112 Encounter for antineoplastic immunotherapy: Secondary | ICD-10-CM | POA: Diagnosis not present

## 2021-10-10 DIAGNOSIS — C787 Secondary malignant neoplasm of liver and intrahepatic bile duct: Secondary | ICD-10-CM | POA: Diagnosis not present

## 2021-10-10 DIAGNOSIS — C3490 Malignant neoplasm of unspecified part of unspecified bronchus or lung: Secondary | ICD-10-CM | POA: Diagnosis not present

## 2021-10-10 DIAGNOSIS — Z79899 Other long term (current) drug therapy: Secondary | ICD-10-CM | POA: Diagnosis not present

## 2021-10-10 LAB — CMP (CANCER CENTER ONLY)
ALT: 29 U/L (ref 0–44)
AST: 30 U/L (ref 15–41)
Albumin: 4 g/dL (ref 3.5–5.0)
Alkaline Phosphatase: 42 U/L (ref 38–126)
Anion gap: 8 (ref 5–15)
BUN: 11 mg/dL (ref 8–23)
CO2: 26 mmol/L (ref 22–32)
Calcium: 9.4 mg/dL (ref 8.9–10.3)
Chloride: 101 mmol/L (ref 98–111)
Creatinine: 1.16 mg/dL (ref 0.61–1.24)
GFR, Estimated: >60 mL/min (ref >60)
Glucose, Bld: 205 mg/dL — ABNORMAL HIGH (ref 70–99)
Potassium: 3.9 mmol/L (ref 3.5–5.1)
Sodium: 135 mmol/L (ref 135–145)
Total Bilirubin: 1 mg/dL (ref 0.3–1.2)
Total Protein: 7.1 g/dL (ref 6.5–8.1)

## 2021-10-10 LAB — CBC WITH DIFFERENTIAL (CANCER CENTER ONLY)
Abs Immature Granulocytes: 0.02 10*3/uL (ref 0.00–0.07)
Basophils Absolute: 0.1 10*3/uL (ref 0.0–0.1)
Basophils Relative: 1 %
Eosinophils Absolute: 0.2 10*3/uL (ref 0.0–0.5)
Eosinophils Relative: 3 %
HCT: 34.2 % — ABNORMAL LOW (ref 39.0–52.0)
Hemoglobin: 11.8 g/dL — ABNORMAL LOW (ref 13.0–17.0)
Immature Granulocytes: 0 %
Lymphocytes Relative: 15 %
Lymphs Abs: 0.8 10*3/uL (ref 0.7–4.0)
MCH: 31.6 pg (ref 26.0–34.0)
MCHC: 34.5 g/dL (ref 30.0–36.0)
MCV: 91.4 fL (ref 80.0–100.0)
Monocytes Absolute: 0.5 10*3/uL (ref 0.1–1.0)
Monocytes Relative: 9 %
Neutro Abs: 3.7 10*3/uL (ref 1.7–7.7)
Neutrophils Relative %: 72 %
Platelet Count: 106 10*3/uL — ABNORMAL LOW (ref 150–400)
RBC: 3.74 MIL/uL — ABNORMAL LOW (ref 4.22–5.81)
RDW: 14.5 % (ref 11.5–15.5)
WBC Count: 5.2 10*3/uL (ref 4.0–10.5)
nRBC: 0 % (ref 0.0–0.2)

## 2021-10-10 MED ORDER — SODIUM CHLORIDE 0.9% FLUSH
10.0000 mL | INTRAVENOUS | Status: DC | PRN
  Administered 2021-10-10: 10 mL

## 2021-10-10 MED ORDER — SODIUM CHLORIDE 0.9 % IV SOLN
10.0000 mg | Freq: Once | INTRAVENOUS | Status: AC
  Administered 2021-10-10: 10 mg via INTRAVENOUS
  Filled 2021-10-10: qty 10

## 2021-10-10 MED ORDER — SODIUM CHLORIDE 0.9 % IV SOLN: Freq: Once | INTRAVENOUS | Status: AC

## 2021-10-10 MED ORDER — HEPARIN SOD (PORK) LOCK FLUSH 100 UNIT/ML IV SOLN
500.0000 [IU] | Freq: Once | INTRAVENOUS | Status: AC | PRN
  Administered 2021-10-10: 500 [IU]

## 2021-10-10 MED ORDER — SODIUM CHLORIDE 0.9 % IV SOLN
100.0000 mg | Freq: Once | INTRAVENOUS | Status: AC
  Administered 2021-10-10: 100 mg via INTRAVENOUS
  Filled 2021-10-10: qty 6

## 2021-10-10 MED ORDER — SODIUM CHLORIDE 0.9% FLUSH
10.0000 mL | Freq: Once | INTRAVENOUS | Status: AC
  Administered 2021-10-10: 10 mL

## 2021-10-10 MED ORDER — PALONOSETRON HCL INJECTION 0.25 MG/5ML
0.2500 mg | Freq: Once | INTRAVENOUS | Status: AC
  Administered 2021-10-10: 0.25 mg via INTRAVENOUS
  Filled 2021-10-10: qty 5

## 2021-10-10 NOTE — Progress Notes (Signed)
Pt reports being itchy at home and prior to infusion, no visual rashes or hives. Dr.Shadad made aware and ok to proceed.  ?

## 2021-10-10 NOTE — Patient Instructions (Signed)
[facility]  Discharge Instructions: ?Thank you for choosing [facility] to provide your oncology and hematology care.  ? ?If you have a lab appointment with the [facility], please go directly to the [facility] and check in at the registration area. ?  ?Wear comfortable clothing and clothing appropriate for easy access to any Portacath or PICC line.  ? ?We strive to give you quality time with your provider. You may need to reschedule your appointment if you arrive late (15 or more minutes).  Arriving late affects you and other patients whose appointments are after yours.  Also, if you miss three or more appointments without notifying the office, you may be dismissed from the clinic at the provider?s discretion.    ?  ?For prescription refill requests, have your pharmacy contact our office and allow 72 hours for refills to be completed.   ? ?Today you received the following chemotherapy and/or immunotherapy agents padcev    ?  ?To help prevent nausea and vomiting after your treatment, we encourage you to take your nausea medication as directed. ? ?BELOW ARE SYMPTOMS THAT SHOULD BE REPORTED IMMEDIATELY: ?*FEVER GREATER THAN 100.4 F (38 ?C) OR HIGHER ?*CHILLS OR SWEATING ?*NAUSEA AND VOMITING THAT IS NOT CONTROLLED WITH YOUR NAUSEA MEDICATION ?*UNUSUAL SHORTNESS OF BREATH ?*UNUSUAL BRUISING OR BLEEDING ?*URINARY PROBLEMS (pain or burning when urinating, or frequent urination) ?*BOWEL PROBLEMS (unusual diarrhea, constipation, pain near the anus) ?TENDERNESS IN MOUTH AND THROAT WITH OR WITHOUT PRESENCE OF ULCERS (sore throat, sores in mouth, or a toothache) ?UNUSUAL RASH, SWELLING OR PAIN  ?UNUSUAL VAGINAL DISCHARGE OR ITCHING  ? ?Items with * indicate a potential emergency and should be followed up as soon as possible or go to the Emergency Department if any problems should occur. ? ?Please show the CHEMOTHERAPY ALERT CARD or IMMUNOTHERAPY ALERT CARD at check-in to the  Emergency Department and triage nurse. ? ?Should you have questions after your visit or need to cancel or reschedule your appointment, please contact [facility]  Dept: [phone]  and follow the prompts.  Office hours are 8:00 a.m. to 4:30 p.m. Monday - Friday. Please note that voicemails left after 4:00 p.m. may not be returned until the following business day.  We are closed weekends and major holidays. You have access to a nurse at all times for urgent questions. Please call the main number to the clinic Dept: [phone] and follow the prompts. ? ? ?For any non-urgent questions, you may also contact your provider using MyChart. We now offer e-Visits for anyone 22 and older to request care online for non-urgent symptoms. For details visit [email or web address]. ?  ?Also download the MyChart app! Go to the app store, search "MyChart", open the app, select [facility], and log in with your MyChart username and password. ? ?Due to Covid, a mask is required upon entering the hospital/clinic. If you do not have a mask, one will be given to you upon arrival. For doctor visits, patients may have 1 support person aged 47 or older with them. For treatment visits, patients cannot have anyone with them due to current Covid guidelines and our immunocompromised population.  ? ?

## 2021-10-17 ENCOUNTER — Inpatient Hospital Stay (HOSPITAL_BASED_OUTPATIENT_CLINIC_OR_DEPARTMENT_OTHER): Payer: Medicare Other | Admitting: Oncology

## 2021-10-17 ENCOUNTER — Other Ambulatory Visit: Payer: Self-pay

## 2021-10-17 ENCOUNTER — Inpatient Hospital Stay: Payer: Medicare Other

## 2021-10-17 ENCOUNTER — Inpatient Hospital Stay: Payer: Medicare Other | Attending: Oncology

## 2021-10-17 VITALS — BP 116/60 | HR 83 | Temp 97.8°F | Resp 19 | Ht 72.0 in | Wt 261.8 lb

## 2021-10-17 DIAGNOSIS — C679 Malignant neoplasm of bladder, unspecified: Secondary | ICD-10-CM

## 2021-10-17 DIAGNOSIS — Z5112 Encounter for antineoplastic immunotherapy: Secondary | ICD-10-CM | POA: Insufficient documentation

## 2021-10-17 DIAGNOSIS — C787 Secondary malignant neoplasm of liver and intrahepatic bile duct: Secondary | ICD-10-CM | POA: Diagnosis not present

## 2021-10-17 DIAGNOSIS — Z95828 Presence of other vascular implants and grafts: Secondary | ICD-10-CM

## 2021-10-17 LAB — CBC WITH DIFFERENTIAL (CANCER CENTER ONLY)
Abs Immature Granulocytes: 0.06 10*3/uL (ref 0.00–0.07)
Basophils Absolute: 0.1 10*3/uL (ref 0.0–0.1)
Basophils Relative: 1 %
Eosinophils Absolute: 0.2 10*3/uL (ref 0.0–0.5)
Eosinophils Relative: 3 %
HCT: 33.3 % — ABNORMAL LOW (ref 39.0–52.0)
Hemoglobin: 11.8 g/dL — ABNORMAL LOW (ref 13.0–17.0)
Immature Granulocytes: 1 %
Lymphocytes Relative: 9 %
Lymphs Abs: 0.6 10*3/uL — ABNORMAL LOW (ref 0.7–4.0)
MCH: 31.9 pg (ref 26.0–34.0)
MCHC: 35.4 g/dL (ref 30.0–36.0)
MCV: 90 fL (ref 80.0–100.0)
Monocytes Absolute: 0.6 10*3/uL (ref 0.1–1.0)
Monocytes Relative: 8 %
Neutro Abs: 5.6 10*3/uL (ref 1.7–7.7)
Neutrophils Relative %: 78 %
Platelet Count: 115 10*3/uL — ABNORMAL LOW (ref 150–400)
RBC: 3.7 MIL/uL — ABNORMAL LOW (ref 4.22–5.81)
RDW: 14.6 % (ref 11.5–15.5)
WBC Count: 7.2 10*3/uL (ref 4.0–10.5)
nRBC: 0 % (ref 0.0–0.2)

## 2021-10-17 LAB — CMP (CANCER CENTER ONLY)
ALT: 60 U/L — ABNORMAL HIGH (ref 0–44)
AST: 60 U/L — ABNORMAL HIGH (ref 15–41)
Albumin: 3.8 g/dL (ref 3.5–5.0)
Alkaline Phosphatase: 47 U/L (ref 38–126)
Anion gap: 9 (ref 5–15)
BUN: 8 mg/dL (ref 8–23)
CO2: 24 mmol/L (ref 22–32)
Calcium: 8.8 mg/dL — ABNORMAL LOW (ref 8.9–10.3)
Chloride: 100 mmol/L (ref 98–111)
Creatinine: 1.3 mg/dL — ABNORMAL HIGH (ref 0.61–1.24)
GFR, Estimated: 59 mL/min — ABNORMAL LOW (ref >60)
Glucose, Bld: 248 mg/dL — ABNORMAL HIGH (ref 70–99)
Potassium: 3.6 mmol/L (ref 3.5–5.1)
Sodium: 133 mmol/L — ABNORMAL LOW (ref 135–145)
Total Bilirubin: 1.6 mg/dL — ABNORMAL HIGH (ref 0.3–1.2)
Total Protein: 7 g/dL (ref 6.5–8.1)

## 2021-10-17 MED ORDER — PALONOSETRON HCL INJECTION 0.25 MG/5ML
0.2500 mg | Freq: Once | INTRAVENOUS | Status: AC
  Administered 2021-10-17: 0.25 mg via INTRAVENOUS
  Filled 2021-10-17: qty 5

## 2021-10-17 MED ORDER — SODIUM CHLORIDE 0.9% FLUSH
10.0000 mL | Freq: Once | INTRAVENOUS | Status: AC
  Administered 2021-10-17: 10 mL

## 2021-10-17 MED ORDER — SODIUM CHLORIDE 0.9 % IV SOLN: Freq: Once | INTRAVENOUS | Status: AC

## 2021-10-17 MED ORDER — SODIUM CHLORIDE 0.9 % IV SOLN
10.0000 mg | Freq: Once | INTRAVENOUS | Status: AC
  Administered 2021-10-17: 10 mg via INTRAVENOUS
  Filled 2021-10-17: qty 10

## 2021-10-17 MED ORDER — SODIUM CHLORIDE 0.9 % IV SOLN
0.7600 mg/kg | Freq: Once | INTRAVENOUS | Status: AC
  Administered 2021-10-17: 90 mg via INTRAVENOUS
  Filled 2021-10-17: qty 9

## 2021-10-17 MED ORDER — SODIUM CHLORIDE 0.9% FLUSH
10.0000 mL | INTRAVENOUS | Status: DC | PRN
  Administered 2021-10-17: 10 mL

## 2021-10-17 MED ORDER — HEPARIN SOD (PORK) LOCK FLUSH 100 UNIT/ML IV SOLN
500.0000 [IU] | Freq: Once | INTRAVENOUS | Status: AC | PRN
  Administered 2021-10-17: 500 [IU]

## 2021-10-17 NOTE — Progress Notes (Signed)
Hematology and Oncology Follow Up Visit ? ?TEJON GRACIE ?[id number] ?Dec 16, 1950 71 y.o. ?10/17/2021 12:47 PM ?[name], [place], [name], MD  ? ?Principle Diagnosis: 71 year old man with IV high-grade urothelial carcinoma of the bladder with hepatic metastasis diagnosed in June 2022. ? ?Secondary diagnosis: Left lung neoplasm consistent with non-small cell lung cancer diagnosed in July 2022. ? ?Prior Therapy: He underwent TURBT on November 13, 2020 and the final pathology revealed high-grade urothelial carcinoma with small cell component of 70%. ? ?Chemotherapy utilizing gemcitabine and cisplatin started on December 27, 2020.  He completed 6 cycles of therapy and October 2022. ? ?Nivolumab 480 mg monthly started on May 02, 2021.  Returns today for repeat infusion. ? ?Current therapy: Padcev 100 mg weekly 3 weeks out of a 28-day cycle.  He is here for day 15 cycle 1. ? ?Interim History: Mr. Javier Sellers returns today for follow-up visit.  Since last visit, he started salvage therapy with Padcev with a few complaints.  He has reported some fatigue and nausea and decline in his appetite.  He denies any vomiting or abdominal discomfort.  He denies any worsening neuropathy.  His performance status quality of life remains unchanged.  He denies any bone pain or pathological fractures. ? ? ? ? ?Medications: Reviewed without changes. ?Current Outpatient Medications  ?Medication Sig Dispense Refill  ? amLODipine (NORVASC) 5 MG tablet Take 5 mg by mouth every morning.    ? diclofenac Sodium (VOLTAREN) 1 % GEL Apply 1 application topically daily as needed (knee pain).    ? lidocaine-prilocaine (EMLA) cream Apply 1 application topically as needed. 30 g 0  ? lisinopril (ZESTRIL) 40 MG tablet Take 40 mg by mouth every morning.    ? Menthol-Camphor (ICY HOT ADVANCED PAIN RELIEF) 16-11 % CREA Apply 1 application topically daily as needed (pain).    ? metoprolol succinate (TOPROL-XL) 100 MG 24 hr tablet Take 100 mg by mouth every  morning.    ? pravastatin (PRAVACHOL) 80 MG tablet Take 80 mg by mouth at bedtime.    ? prochlorperazine (COMPAZINE) 10 MG tablet Take 1 tablet (10 mg total) by mouth every 6 (six) hours as needed for nausea or vomiting. 30 tablet 0  ? tamsulosin (FLOMAX) 0.4 MG CAPS capsule Take 0.4 mg by mouth daily.    ? ?No current facility-administered medications for this visit.  ? ?Facility-Administered Medications Ordered in Other Visits  ?Medication Dose Route Frequency Provider Last Rate Last Admin  ? sodium chloride flush (NS) 0.9 % injection 10 mL  10 mL Intracatheter Once Wyatt Portela, MD      ? ? ? ?Allergies: No Known Allergies ? ? ?Physical Exam: ? ? ? ? ? ? ? ?Blood pressure 116/60, pulse 83, temperature 97.8 ?F (36.6 ?C), temperature source Axillary, resp. rate 19, height 6' (1.829 m), weight 261 lb 12.8 oz (118.8 kg), SpO2 97 %. ? ? ? ? ? ?ECOG: 1 ? ? ? ?General appearance: Comfortable appearing without any discomfort ?Head: Normocephalic without any trauma ?Oropharynx: Mucous membranes are moist and pink without any thrush or ulcers. ?Eyes: Pupils are equal and round reactive to light. ?Lymph nodes: No cervical, supraclavicular, inguinal or axillary lymphadenopathy.   ?Heart:regular rate and rhythm.  S1 and S2 without leg edema. ?Lung: Clear without any rhonchi or wheezes.  No dullness to percussion. ?Abdomin: Soft, nontender, nondistended with good bowel sounds.  No hepatosplenomegaly. ?Musculoskeletal: No joint deformity or effusion.  Full range of motion noted. ?Neurological: No  deficits noted on motor, sensory and deep tendon reflex exam. ?Skin: No petechial rash or dryness.  Appeared moist.  ? ? ? ? ? ? ? ? ? ?Lab Results: ?Lab Results  ?Component Value Date  ? WBC 5.2 10/10/2021  ? HGB 11.8 (L) 10/10/2021  ? HCT 34.2 (L) 10/10/2021  ? MCV 91.4 10/10/2021  ? PLT 106 (L) 10/10/2021  ? ?  Chemistry   ?   ?Component Value Date/Time  ? NA 135 10/10/2021 1436  ? K 3.9 10/10/2021 1436  ? CL 101 10/10/2021 1436   ? CO2 26 10/10/2021 1436  ? BUN 11 10/10/2021 1436  ? CREATININE 1.16 10/10/2021 1436  ?    ?Component Value Date/Time  ? CALCIUM 9.4 10/10/2021 1436  ? ALKPHOS 42 10/10/2021 1436  ? AST 30 10/10/2021 1436  ? ALT 29 10/10/2021 1436  ? BILITOT 1.0 10/10/2021 1436  ?  ? ? ? ? ? ?Impression and Plan: ? ?71 year old man with: ? ?1.  Bladder cancer diagnosed in June 2022.  He was found to have stage IV high-grade urothelial carcinoma. ? ?He is currently on salvage therapy utilizing Padcev and currently completing the first cycle of therapy.  Risks and benefits of continuing this treatment were discussed at this time.  Complications that include hyperglycemia, neuropathy as well as myelosuppression among others were reiterated.  The plan is to update his staging scans after cycle 3.  He is agreeable to proceed and we will adjust the dosing given toxicities he is experiencing. ? ? ?  ?  ?2.  IV access: Port-A-Cath remains in place and currently in use for treatment. ?  ?3.  Neuropathy: He still has grade 1 neuropathy which we will monitor closely on Padcev. ? ?4.  Renal function surveillance: Creatinine clearance continues to be within normal range at this time. ? ? ?5.  Goals of care: Therapy remains palliative but aggressive measures are warranted given his reasonable performance status. ?  ?6.  Lung neoplasm: No evidence of disease progression noted on previous imaging studies. ? ?7.  Follow-up: He will return in 2 weeks for the start of next cycle of therapy.   ? ?30  minutes were dedicated to this visit.  The time was spent on reviewing disease status, laboratory data review, outlining future plan of care discussion and answering questions regarding prognosis. ? ? ?Zola Button, MD ?5/5/202312:47 PM ? ?

## 2021-10-17 NOTE — Patient Instructions (Signed)
[facility]  Discharge Instructions: ?Thank you for choosing [facility] to provide your oncology and hematology care.  ? ?If you have a lab appointment with the [facility], please go directly to the [facility] and check in at the registration area. ?  ?Wear comfortable clothing and clothing appropriate for easy access to any Portacath or PICC line.  ? ?We strive to give you quality time with your provider. You may need to reschedule your appointment if you arrive late (15 or more minutes).  Arriving late affects you and other patients whose appointments are after yours.  Also, if you miss three or more appointments without notifying the office, you may be dismissed from the clinic at the provider?s discretion.    ?  ?For prescription refill requests, have your pharmacy contact our office and allow 72 hours for refills to be completed.   ? ?Today you received the following chemotherapy and/or immunotherapy agents: Padcev    ?  ?To help prevent nausea and vomiting after your treatment, we encourage you to take your nausea medication as directed. ? ?BELOW ARE SYMPTOMS THAT SHOULD BE REPORTED IMMEDIATELY: ?*FEVER GREATER THAN 100.4 F (38 ?C) OR HIGHER ?*CHILLS OR SWEATING ?*NAUSEA AND VOMITING THAT IS NOT CONTROLLED WITH YOUR NAUSEA MEDICATION ?*UNUSUAL SHORTNESS OF BREATH ?*UNUSUAL BRUISING OR BLEEDING ?*URINARY PROBLEMS (pain or burning when urinating, or frequent urination) ?*BOWEL PROBLEMS (unusual diarrhea, constipation, pain near the anus) ?TENDERNESS IN MOUTH AND THROAT WITH OR WITHOUT PRESENCE OF ULCERS (sore throat, sores in mouth, or a toothache) ?UNUSUAL RASH, SWELLING OR PAIN  ?UNUSUAL VAGINAL DISCHARGE OR ITCHING  ? ?Items with * indicate a potential emergency and should be followed up as soon as possible or go to the Emergency Department if any problems should occur. ? ?Please show the CHEMOTHERAPY ALERT CARD or IMMUNOTHERAPY ALERT CARD at check-in to the  Emergency Department and triage nurse. ? ?Should you have questions after your visit or need to cancel or reschedule your appointment, please contact [facility]  Dept: [phone]  and follow the prompts.  Office hours are 8:00 a.m. to 4:30 p.m. Monday - Friday. Please note that voicemails left after 4:00 p.m. may not be returned until the following business day.  We are closed weekends and major holidays. You have access to a nurse at all times for urgent questions. Please call the main number to the clinic Dept: [phone] and follow the prompts. ? ? ?For any non-urgent questions, you may also contact your provider using MyChart. We now offer e-Visits for anyone 39 and older to request care online for non-urgent symptoms. For details visit [email or web address]. ?  ?Also download the MyChart app! Go to the app store, search "MyChart", open the app, select [facility], and log in with your MyChart username and password. ? ?Due to Covid, a mask is required upon entering the hospital/clinic. If you do not have a mask, one will be given to you upon arrival. For doctor visits, patients may have 1 support Maleeah Crossman aged 46 or older with them. For treatment visits, patients cannot have anyone with them due to current Covid guidelines and our immunocompromised population.  ? ?

## 2021-10-17 NOTE — Progress Notes (Signed)
Per Dr. Alen Blew okay to treat today with bilirubin 1.6 mg/dL ?

## 2021-10-30 MED FILL — Dexamethasone Sodium Phosphate Inj 100 MG/10ML: INTRAMUSCULAR | Qty: 1 | Status: AC

## 2021-10-31 ENCOUNTER — Emergency Department (HOSPITAL_COMMUNITY): Payer: Medicare Other

## 2021-10-31 ENCOUNTER — Inpatient Hospital Stay: Payer: Medicare Other

## 2021-10-31 ENCOUNTER — Other Ambulatory Visit: Payer: Self-pay

## 2021-10-31 ENCOUNTER — Inpatient Hospital Stay (HOSPITAL_COMMUNITY)
Admission: EM | Admit: 2021-10-31 | Discharge: 2021-11-02 | DRG: 637 | Disposition: A | Discharge status: Home / Self Care | Payer: Medicare Other | Attending: Internal Medicine | Admitting: Internal Medicine

## 2021-10-31 ENCOUNTER — Telehealth: Payer: Self-pay | Admitting: Oncology

## 2021-10-31 ENCOUNTER — Emergency Department (HOSPITAL_BASED_OUTPATIENT_CLINIC_OR_DEPARTMENT_OTHER)
Admit: 2021-10-31 | Discharge: 2021-10-31 | Disposition: A | Discharge status: Home / Self Care | Payer: Medicare Other | Attending: Emergency Medicine | Admitting: Emergency Medicine

## 2021-10-31 VITALS — BP 109/73 | HR 100 | Temp 97.7°F | Resp 18 | Wt 244.8 lb

## 2021-10-31 DIAGNOSIS — R739 Hyperglycemia, unspecified: Secondary | ICD-10-CM

## 2021-10-31 DIAGNOSIS — R9431 Abnormal electrocardiogram [ECG] [EKG]: Secondary | ICD-10-CM

## 2021-10-31 DIAGNOSIS — C679 Malignant neoplasm of bladder, unspecified: Secondary | ICD-10-CM

## 2021-10-31 DIAGNOSIS — Z9221 Personal history of antineoplastic chemotherapy: Secondary | ICD-10-CM | POA: Diagnosis not present

## 2021-10-31 DIAGNOSIS — C678 Malignant neoplasm of overlapping sites of bladder: Secondary | ICD-10-CM

## 2021-10-31 DIAGNOSIS — Z95828 Presence of other vascular implants and grafts: Secondary | ICD-10-CM

## 2021-10-31 DIAGNOSIS — G9341 Metabolic encephalopathy: Secondary | ICD-10-CM

## 2021-10-31 DIAGNOSIS — I10 Essential (primary) hypertension: Secondary | ICD-10-CM | POA: Diagnosis not present

## 2021-10-31 DIAGNOSIS — N179 Acute kidney failure, unspecified: Secondary | ICD-10-CM | POA: Diagnosis not present

## 2021-10-31 DIAGNOSIS — E1010 Type 1 diabetes mellitus with ketoacidosis without coma: Principal | ICD-10-CM | POA: Diagnosis present

## 2021-10-31 DIAGNOSIS — E1110 Type 2 diabetes mellitus with ketoacidosis without coma: Secondary | ICD-10-CM | POA: Diagnosis not present

## 2021-10-31 DIAGNOSIS — Z79899 Other long term (current) drug therapy: Secondary | ICD-10-CM | POA: Diagnosis not present

## 2021-10-31 DIAGNOSIS — C3492 Malignant neoplasm of unspecified part of left bronchus or lung: Secondary | ICD-10-CM | POA: Diagnosis not present

## 2021-10-31 DIAGNOSIS — E876 Hypokalemia: Secondary | ICD-10-CM

## 2021-10-31 DIAGNOSIS — E0910 Drug or chemical induced diabetes mellitus with ketoacidosis without coma: Secondary | ICD-10-CM | POA: Diagnosis not present

## 2021-10-31 DIAGNOSIS — M79604 Pain in right leg: Secondary | ICD-10-CM | POA: Diagnosis present

## 2021-10-31 DIAGNOSIS — E860 Dehydration: Secondary | ICD-10-CM | POA: Diagnosis not present

## 2021-10-31 DIAGNOSIS — D72819 Decreased white blood cell count, unspecified: Secondary | ICD-10-CM

## 2021-10-31 DIAGNOSIS — Z87891 Personal history of nicotine dependence: Secondary | ICD-10-CM

## 2021-10-31 DIAGNOSIS — C787 Secondary malignant neoplasm of liver and intrahepatic bile duct: Secondary | ICD-10-CM | POA: Diagnosis present

## 2021-10-31 DIAGNOSIS — M79605 Pain in left leg: Secondary | ICD-10-CM | POA: Diagnosis not present

## 2021-10-31 DIAGNOSIS — R4182 Altered mental status, unspecified: Secondary | ICD-10-CM | POA: Diagnosis not present

## 2021-10-31 DIAGNOSIS — E7800 Pure hypercholesterolemia, unspecified: Secondary | ICD-10-CM | POA: Diagnosis present

## 2021-10-31 DIAGNOSIS — Z9049 Acquired absence of other specified parts of digestive tract: Secondary | ICD-10-CM | POA: Diagnosis not present

## 2021-10-31 LAB — CMP (CANCER CENTER ONLY)
ALT: 47 U/L — ABNORMAL HIGH (ref 0–44)
AST: 34 U/L (ref 15–41)
Albumin: 3.2 g/dL — ABNORMAL LOW (ref 3.5–5.0)
Alkaline Phosphatase: 93 U/L (ref 38–126)
Anion gap: 15 (ref 5–15)
BUN: 30 mg/dL — ABNORMAL HIGH (ref 8–23)
CO2: 21 mmol/L — ABNORMAL LOW (ref 22–32)
Calcium: 8.9 mg/dL (ref 8.9–10.3)
Chloride: 95 mmol/L — ABNORMAL LOW (ref 98–111)
Creatinine: 2.44 mg/dL — ABNORMAL HIGH (ref 0.61–1.24)
GFR, Estimated: 28 mL/min — ABNORMAL LOW (ref >60)
Glucose, Bld: 647 mg/dL (ref 70–99)
Potassium: 3.8 mmol/L (ref 3.5–5.1)
Sodium: 131 mmol/L — ABNORMAL LOW (ref 135–145)
Total Bilirubin: 1.5 mg/dL — ABNORMAL HIGH (ref 0.3–1.2)
Total Protein: 6.8 g/dL (ref 6.5–8.1)

## 2021-10-31 LAB — BLOOD GAS, VENOUS
Acid-base deficit: 1.1 mmol/L (ref 0.0–2.0)
Bicarbonate: 23.5 mmol/L (ref 20.0–28.0)
O2 Saturation: 62.1 %
Patient temperature: 37
pCO2, Ven: 38 mmHg — ABNORMAL LOW (ref 44–60)
pH, Ven: 7.4 (ref 7.25–7.43)
pO2, Ven: 37 mmHg (ref 32–45)

## 2021-10-31 LAB — CBC WITH DIFFERENTIAL/PLATELET
Abs Immature Granulocytes: 0.06 10*3/uL (ref 0.00–0.07)
Basophils Absolute: 0.2 10*3/uL — ABNORMAL HIGH (ref 0.0–0.1)
Basophils Relative: 5 %
Eosinophils Absolute: 0 10*3/uL (ref 0.0–0.5)
Eosinophils Relative: 1 %
HCT: 34.6 % — ABNORMAL LOW (ref 39.0–52.0)
Hemoglobin: 12.2 g/dL — ABNORMAL LOW (ref 13.0–17.0)
Immature Granulocytes: 2 %
Lymphocytes Relative: 26 %
Lymphs Abs: 0.9 10*3/uL (ref 0.7–4.0)
MCH: 32.8 pg (ref 26.0–34.0)
MCHC: 35.3 g/dL (ref 30.0–36.0)
MCV: 93 fL (ref 80.0–100.0)
Monocytes Absolute: 0.7 10*3/uL (ref 0.1–1.0)
Monocytes Relative: 18 %
Neutro Abs: 1.7 10*3/uL (ref 1.7–7.7)
Neutrophils Relative %: 48 %
Platelets: 155 10*3/uL (ref 150–400)
RBC: 3.72 MIL/uL — ABNORMAL LOW (ref 4.22–5.81)
RDW: 17.2 % — ABNORMAL HIGH (ref 11.5–15.5)
WBC: 3.6 10*3/uL — ABNORMAL LOW (ref 4.0–10.5)
nRBC: 1.1 % — ABNORMAL HIGH (ref 0.0–0.2)

## 2021-10-31 LAB — COMPREHENSIVE METABOLIC PANEL
ALT: 45 U/L — ABNORMAL HIGH (ref 0–44)
AST: 30 U/L (ref 15–41)
Albumin: 3.1 g/dL — ABNORMAL LOW (ref 3.5–5.0)
Alkaline Phosphatase: 86 U/L (ref 38–126)
Anion gap: 13 (ref 5–15)
BUN: 29 mg/dL — ABNORMAL HIGH (ref 8–23)
CO2: 19 mmol/L — ABNORMAL LOW (ref 22–32)
Calcium: 8.5 mg/dL — ABNORMAL LOW (ref 8.9–10.3)
Chloride: 97 mmol/L — ABNORMAL LOW (ref 98–111)
Creatinine, Ser: 2.23 mg/dL — ABNORMAL HIGH (ref 0.61–1.24)
GFR, Estimated: 31 mL/min — ABNORMAL LOW (ref >60)
Glucose, Bld: 571 mg/dL (ref 70–99)
Potassium: 3.5 mmol/L (ref 3.5–5.1)
Sodium: 129 mmol/L — ABNORMAL LOW (ref 135–145)
Total Bilirubin: 1.6 mg/dL — ABNORMAL HIGH (ref 0.3–1.2)
Total Protein: 6.6 g/dL (ref 6.5–8.1)

## 2021-10-31 LAB — CBC WITH DIFFERENTIAL (CANCER CENTER ONLY)
Abs Immature Granulocytes: 0.06 10*3/uL (ref 0.00–0.07)
Basophils Absolute: 0.2 10*3/uL — ABNORMAL HIGH (ref 0.0–0.1)
Basophils Relative: 5 %
Eosinophils Absolute: 0 10*3/uL (ref 0.0–0.5)
Eosinophils Relative: 1 %
HCT: 36.2 % — ABNORMAL LOW (ref 39.0–52.0)
Hemoglobin: 12.8 g/dL — ABNORMAL LOW (ref 13.0–17.0)
Immature Granulocytes: 2 %
Lymphocytes Relative: 24 %
Lymphs Abs: 1 10*3/uL (ref 0.7–4.0)
MCH: 32.4 pg (ref 26.0–34.0)
MCHC: 35.4 g/dL (ref 30.0–36.0)
MCV: 91.6 fL (ref 80.0–100.0)
Monocytes Absolute: 0.9 10*3/uL (ref 0.1–1.0)
Monocytes Relative: 21 %
Neutro Abs: 2 10*3/uL (ref 1.7–7.7)
Neutrophils Relative %: 47 %
Platelet Count: 168 10*3/uL (ref 150–400)
RBC: 3.95 MIL/uL — ABNORMAL LOW (ref 4.22–5.81)
RDW: 17.2 % — ABNORMAL HIGH (ref 11.5–15.5)
WBC Count: 4.1 10*3/uL (ref 4.0–10.5)
nRBC: 0.5 % — ABNORMAL HIGH (ref 0.0–0.2)

## 2021-10-31 LAB — BETA-HYDROXYBUTYRIC ACID: Beta-Hydroxybutyric Acid: 2.32 mmol/L — ABNORMAL HIGH (ref 0.05–0.27)

## 2021-10-31 LAB — CBG MONITORING, ED
Glucose-Capillary: 421 mg/dL — ABNORMAL HIGH (ref 70–99)
Glucose-Capillary: 560 mg/dL (ref 70–99)
Glucose-Capillary: 580 mg/dL (ref 70–99)
Glucose-Capillary: 504 mg/dL (ref 70–99)
Glucose-Capillary: 517 mg/dL (ref 70–99)

## 2021-10-31 LAB — GLUCOSE, CAPILLARY: Glucose-Capillary: 438 mg/dL — ABNORMAL HIGH (ref 70–99)

## 2021-10-31 MED ORDER — SODIUM CHLORIDE 0.9 % IV BOLUS
1000.0000 mL | Freq: Once | INTRAVENOUS | Status: AC
  Administered 2021-10-31: 1000 mL via INTRAVENOUS

## 2021-10-31 MED ORDER — SODIUM CHLORIDE 0.9 % IV SOLN: Freq: Once | INTRAVENOUS | Status: AC

## 2021-10-31 MED ORDER — SODIUM CHLORIDE 0.9% FLUSH
10.0000 mL | Freq: Once | INTRAVENOUS | Status: AC
  Administered 2021-10-31: 10 mL

## 2021-10-31 MED ORDER — DEXTROSE 50 % IV SOLN: 0.0000 mL | INTRAVENOUS | Status: DC | PRN

## 2021-10-31 MED ORDER — LACTATED RINGERS IV BOLUS
20.0000 mL/kg | Freq: Once | INTRAVENOUS | Status: AC
  Administered 2021-10-31: 2214 mL via INTRAVENOUS

## 2021-10-31 MED ORDER — INSULIN REGULAR(HUMAN) IN NACL 100-0.9 UT/100ML-% IV SOLN
INTRAVENOUS | Status: DC
  Administered 2021-10-31: 8 [IU]/h via INTRAVENOUS
  Administered 2021-11-01: 10 [IU]/h via INTRAVENOUS
  Administered 2021-11-01: 8 [IU]/h via INTRAVENOUS
  Filled 2021-10-31 (×3): qty 100

## 2021-10-31 MED ORDER — INSULIN ASPART 100 UNIT/ML IJ SOLN
10.0000 [IU] | Freq: Once | INTRAMUSCULAR | Status: AC
  Administered 2021-10-31: 10 [IU] via SUBCUTANEOUS
  Filled 2021-10-31: qty 1

## 2021-10-31 MED ORDER — DEXTROSE IN LACTATED RINGERS 5 % IV SOLN: INTRAVENOUS | Status: DC

## 2021-10-31 MED ORDER — POTASSIUM CHLORIDE 10 MEQ/100ML IV SOLN
10.0000 meq | INTRAVENOUS | Status: AC
  Administered 2021-10-31 (×2): 10 meq via INTRAVENOUS
  Filled 2021-10-31 (×2): qty 100

## 2021-10-31 MED ORDER — LACTATED RINGERS IV SOLN: INTRAVENOUS | Status: DC

## 2021-10-31 MED ORDER — LORAZEPAM 2 MG/ML IJ SOLN: 0.5000 mg | Freq: Four times a day (QID) | INTRAMUSCULAR | Status: DC | PRN

## 2021-10-31 NOTE — ED Notes (Addendum)
Patient noted to be confused, not alert to place or time.  Patient thinks he is at [facility], the year is 2016, and someone was sleeping in the bed with him earlier.  Patient's wife called in an attempt to get mental baseline, no answer, voicemail not set up. DO Sherrian Divers messaged via secure chat.

## 2021-10-31 NOTE — Telephone Encounter (Signed)
Called patient regarding upcoming June appointments, patient is notified.  

## 2021-10-31 NOTE — Progress Notes (Signed)
Bilateral lower extremity venous duplex has been completed. Preliminary results can be found in CV Proc through chart review.  Results were given to Dr. Gilford Raid.  10/31/21 6:58 PM Javier Sellers RVT

## 2021-10-31 NOTE — Assessment & Plan Note (Addendum)
-  Cr improving -Held ACEI on d/c. May resume down the road once renal function normalizes

## 2021-10-31 NOTE — ED Provider Notes (Signed)
[place] DEPT Provider Note   CSN: [id number] Arrival date & time: 10/31/21  1720     History  Chief Complaint  Patient presents with   Hyperglycemia    Javier Sellers is a 71 y.o. male.  Pt is a 71 yo male with a pmhx significant for htn, high cholesterol, and bladder and lung cancer.  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  He was then diagnosed with left lung non-small cell lung cancer.  He completed 6 cycles of chemo utilizing gemcitabine and in October 2022.  Nivolumab 480 mg monthly was then started on May 02, 2021. Pt is also getting salvage therapy utilizing Padcev 100 mg weekly 3 weeks out of a 28-day cycle which was started for the first time on 5/5.  1 of the main complications of this treatment is hyperglycemia.  Pt has no prior hx of DM.  Pt has been feeling poorly since that treatment.  He's not been able to eat or drink.  He has not gotten out of bed for several days.  He went to the cancer center today for his chemo and his bs was 647.  He was given 500 cc NS and 10 units of insulin.  Chemo was held.  Pt felt worse, so came to the ED.  Pt c/o bilateral leg pain.        Home Medications Prior to Admission medications   Medication Sig Start Date End Date Taking? Authorizing Provider  amLODipine (NORVASC) 5 MG tablet Take 5 mg by mouth every morning. 09/10/20   [provider]  diclofenac Sodium (VOLTAREN) 1 % GEL Apply 1 application topically daily as needed (knee pain).    [provider]  lidocaine-prilocaine (EMLA) cream Apply 1 application topically as needed. 12/13/20   Wyatt Portela, MD  lisinopril (ZESTRIL) 40 MG tablet Take 40 mg by mouth every morning. 09/10/20   [provider]  Menthol-Camphor (ICY HOT ADVANCED PAIN RELIEF) 16-11 % CREA Apply 1 application topically daily as needed (pain).    [provider]   metoprolol succinate (TOPROL-XL) 100 MG 24 hr tablet Take 100 mg by mouth every morning. 09/10/20   [provider]  pravastatin (PRAVACHOL) 80 MG tablet Take 80 mg by mouth at bedtime. 09/10/20   [provider]  prochlorperazine (COMPAZINE) 10 MG tablet Take 1 tablet (10 mg total) by mouth every 6 (six) hours as needed for nausea or vomiting. 12/13/20   Wyatt Portela, MD  tamsulosin (FLOMAX) 0.4 MG CAPS capsule Take 0.4 mg by mouth daily. 10/23/20   [provider]      Allergies    Patient has no known allergies.    Review of Systems   Review of Systems  Neurological:  Positive for weakness.  All other systems reviewed and are negative.  Physical Exam Updated Vital Signs BP (!) 170/79   Pulse 97   Temp 97.6 F (36.4 C) (Oral)   Resp 19   Ht 6' (1.829 m)   Wt 110.7 kg   SpO2 100%   BMI 33.09 kg/m  Physical Exam Vitals and nursing note reviewed.  Constitutional:      Appearance: Normal appearance.  HENT:     Head: Normocephalic and atraumatic.     Right Ear: External ear normal.     Left Ear: External ear normal.     Nose: Nose normal.  Mouth/Throat:     Mouth: Mucous membranes are dry.  Eyes:     Extraocular Movements: Extraocular movements intact.     Conjunctiva/sclera: Conjunctivae normal.     Pupils: Pupils are equal, round, and reactive to light.  Cardiovascular:     Rate and Rhythm: Normal rate and regular rhythm.     Pulses: Normal pulses.     Heart sounds: Normal heart sounds.  Pulmonary:     Effort: Pulmonary effort is normal.     Breath sounds: Normal breath sounds.  Abdominal:     General: Abdomen is flat. Bowel sounds are normal.     Palpations: Abdomen is soft.  Musculoskeletal:     Cervical back: Normal range of motion and neck supple.     Right lower leg: Edema present.     Left lower leg: Edema present.  Skin:    General: Skin is warm.     Capillary Refill: Capillary refill takes less than 2 seconds.   Neurological:     General: No focal deficit present.     Mental Status: He is alert and oriented to person, place, and time.  Psychiatric:        Mood and Affect: Mood normal.        Behavior: Behavior normal.    ED Results / Procedures / Treatments   Labs (all labs ordered are listed, but only abnormal results are displayed) Labs Reviewed  COMPREHENSIVE METABOLIC PANEL - Abnormal; Notable for the following components:      Result Value   Sodium 129 (*)    Chloride 97 (*)    CO2 19 (*)    Glucose, Bld 571 (*)    BUN 29 (*)    Creatinine, Ser 2.23 (*)    Calcium 8.5 (*)    Albumin 3.1 (*)    ALT 45 (*)    Total Bilirubin 1.6 (*)    GFR, Estimated 31 (*)    All other components within normal limits  CBC WITH DIFFERENTIAL/PLATELET - Abnormal; Notable for the following components:   WBC 3.6 (*)    RBC 3.72 (*)    Hemoglobin 12.2 (*)    HCT 34.6 (*)    RDW 17.2 (*)    nRBC 1.1 (*)    Basophils Absolute 0.2 (*)    All other components within normal limits  BLOOD GAS, VENOUS - Abnormal; Notable for the following components:   pCO2, Ven 38 (*)    All other components within normal limits  BETA-HYDROXYBUTYRIC ACID - Abnormal; Notable for the following components:   Beta-Hydroxybutyric Acid 2.32 (*)    All other components within normal limits  CBG MONITORING, ED - Abnormal; Notable for the following components:   Glucose-Capillary 421 (*)    All other components within normal limits  CBG MONITORING, ED - Abnormal; Notable for the following components:   Glucose-Capillary 560 (*)    All other components within normal limits  URINALYSIS, ROUTINE W REFLEX MICROSCOPIC  BASIC METABOLIC PANEL  BASIC METABOLIC PANEL  BASIC METABOLIC PANEL  BASIC METABOLIC PANEL  BETA-HYDROXYBUTYRIC ACID  BETA-HYDROXYBUTYRIC ACID  HEMOGLOBIN A1C  CBG MONITORING, ED  CBG MONITORING, ED    EKG EKG Interpretation  Date/Time:  Friday Oct 31 2021 20:04:04 EDT Ventricular Rate:  98 PR  Interval:  150 QRS Duration: 94 QT Interval:  358 QTC Calculation: 458 R Axis:   75 Text Interpretation: Sinus rhythm Nonspecific repol abnormality, diffuse leads No significant change since last tracing Confirmed by Gilford Raid,  Almyra Free ([id number]) on 10/31/2021 8:22:13 PM  Radiology CT Head Wo Contrast  Result Date: 10/31/2021 CLINICAL DATA:  Altered mental status. EXAM: CT HEAD WITHOUT CONTRAST TECHNIQUE: Contiguous axial images were obtained from the base of the skull through the vertex without intravenous contrast. RADIATION DOSE REDUCTION: This exam was performed according to the departmental dose-optimization program which includes automated exposure control, adjustment of the mA and/or kV according to patient size and/or use of iterative reconstruction technique. COMPARISON:  None Available. FINDINGS: Brain: Mild age-related atrophy and chronic microvascular ischemic changes. There is no acute intracranial hemorrhage. No mass effect or midline shift. No extra-axial fluid collection. Vascular: No hyperdense vessel or unexpected calcification. Skull: Normal. Negative for fracture or focal lesion. Sinuses/Orbits: No acute finding. Other: None IMPRESSION: 1. No acute intracranial pathology. 2. Mild age-related atrophy and chronic microvascular ischemic changes. Electronically Signed   By: Anner Crete M.D.   On: 10/31/2021 19:31   DG Chest Portable 1 View  Result Date: 10/31/2021 CLINICAL DATA:  Altered mental status, hyperglycemia EXAM: PORTABLE CHEST 1 VIEW COMPARISON:  12/20/2020 FINDINGS: Lungs are clear. No pneumothorax or pleural effusion. Right internal jugular chest port tip is seen at the superior cavoatrial junction. Cardiac size within normal limits. Pulmonary vascularity is normal. Surgical clips again seen at the left apex. IMPRESSION: No active disease. Electronically Signed   By: Fidela Salisbury M.D.   On: 10/31/2021 20:01   VAS [place] LOWER EXTREMITY VENOUS (DVT) (ONLY [facility] & [facility])  Result  Date: 10/31/2021  Lower Venous DVT Study Patient Name:  Javier Sellers  Date of Exam:   10/31/2021 Medical Rec #: [id number]     Accession #:    [id number] Date of Birth: 04-23-1951      Patient Gender: M Patient Age:   40 years Exam Location:  [facility] Procedure:      VAS [place] LOWER EXTREMITY VENOUS (DVT) Referring Phys: Khoa Opdahl --------------------------------------------------------------------------------  Indications: Pain.  Risk Factors: Cancer. Limitations: Body habitus and poor ultrasound/tissue interface. Comparison Study: No prior studies. Performing Technologist: Oliver Hum RVT  Examination Guidelines: A complete evaluation includes B-mode imaging, spectral Doppler, color Doppler, and power Doppler as needed of all accessible portions of each vessel. Bilateral testing is considered an integral part of a complete examination. Limited examinations for reoccurring indications may be performed as noted. The reflux portion of the exam is performed with the patient in reverse Trendelenburg.  +---------+---------------+---------+-----------+----------+--------------+ RIGHT    CompressibilityPhasicitySpontaneityPropertiesThrombus Aging +---------+---------------+---------+-----------+----------+--------------+ CFV      Full           Yes      Yes                                 +---------+---------------+---------+-----------+----------+--------------+ SFJ      Full                                                        +---------+---------------+---------+-----------+----------+--------------+ FV Prox  Full                                                        +---------+---------------+---------+-----------+----------+--------------+  FV Mid   Full                                                        +---------+---------------+---------+-----------+----------+--------------+ FV DistalFull                                                         +---------+---------------+---------+-----------+----------+--------------+ PFV      Full                                                        +---------+---------------+---------+-----------+----------+--------------+ POP      Full           Yes      Yes                                 +---------+---------------+---------+-----------+----------+--------------+ PTV      Full                                                        +---------+---------------+---------+-----------+----------+--------------+ PERO     Full                                                        +---------+---------------+---------+-----------+----------+--------------+   +---------+---------------+---------+-----------+----------+--------------+ LEFT     CompressibilityPhasicitySpontaneityPropertiesThrombus Aging +---------+---------------+---------+-----------+----------+--------------+ CFV      Full           Yes      Yes                                 +---------+---------------+---------+-----------+----------+--------------+ SFJ      Full                                                        +---------+---------------+---------+-----------+----------+--------------+ FV Prox  Full                                                        +---------+---------------+---------+-----------+----------+--------------+ FV Mid   Full                                                        +---------+---------------+---------+-----------+----------+--------------+  FV DistalFull                                                        +---------+---------------+---------+-----------+----------+--------------+ PFV      Full                                                        +---------+---------------+---------+-----------+----------+--------------+ POP      Full           Yes      Yes                                  +---------+---------------+---------+-----------+----------+--------------+ PTV      Full                                                        +---------+---------------+---------+-----------+----------+--------------+ PERO     Full                                                        +---------+---------------+---------+-----------+----------+--------------+    Summary: RIGHT: - There is no evidence of deep vein thrombosis in the lower extremity. However, portions of this examination were limited- see technologist comments above.  - No cystic structure found in the popliteal fossa.  LEFT: - There is no evidence of deep vein thrombosis in the lower extremity. However, portions of this examination were limited- see technologist comments above.  - No cystic structure found in the popliteal fossa.  *See table(s) above for measurements and observations.    Preliminary     Procedures Procedures    Medications Ordered in ED Medications  insulin regular, human (MYXREDLIN) 100 units/ 100 mL infusion (8 Units/hr Intravenous New Bag/Given 10/31/21 2011)  lactated ringers infusion (has no administration in time range)  dextrose 5 % in lactated ringers infusion (has no administration in time range)  dextrose 50 % solution 0-50 mL (has no administration in time range)  potassium chloride 10 mEq in 100 mL IVPB (10 mEq Intravenous New Bag/Given 10/31/21 2013)  sodium chloride 0.9 % bolus 1,000 mL (1,000 mLs Intravenous New Bag/Given 10/31/21 1810)  lactated ringers bolus 2,214 mL (2,214 mLs Intravenous New Bag/Given 10/31/21 1951)    ED Course/ Medical Decision Making/ A&P                           Medical Decision Making Amount and/or Complexity of Data Reviewed Labs: ordered. Radiology: ordered.  Risk Prescription drug management. Decision regarding hospitalization.   This patient presents to the ED for concern of ams and hyperglycemia, this involves an extensive number of treatment  options, and is a complaint that carries with it a high risk of complications and morbidity.  The differential  diagnosis includes dka, hhs, infection   Co morbidities that complicate the patient evaluation  htn, high cholesterol, and bladder and lung cancer   Additional history obtained:  Additional history obtained from epic chart review External records from outside source obtained and reviewed including family   Lab Tests:  I Ordered, and personally interpreted labs.  The pertinent results include:  cbc with wbc low at 3.6, hgb 12.2; cmp with glucose 571, bun 29 and Cr 2.23; bhb elevated at 3.32; vbg with nl ph   Imaging Studies ordered:  I ordered imaging studies including ct head, cxr, vascular US  I independently visualized and interpreted imaging which showed  CT head:   IMPRESSION:  1. No acute intracranial pathology.  2. Mild age-related atrophy and chronic microvascular ischemic  changes.     CXR:   IMPRESSION:  No active disease.     B LE [place]:      Summary:  RIGHT:  - There is no evidence of deep vein thrombosis in the lower extremity. However, portions of this examination were limited- see technologist comments above.     - No cystic structure found in the popliteal fossa.     LEFT:  - There is no evidence of deep vein thrombosis in the lower extremity. However, portions of this examination were limited- see technologist comments above.     - No cystic structure found in the popliteal fossa.   I agree with the radiologist interpretation   Cardiac Monitoring:  The patient was maintained on a cardiac monitor.  I personally viewed and interpreted the cardiac monitored which showed an underlying rhythm of: initially sinus tach, but now nsr   Medicines ordered and prescription drug management:  I ordered medication including ivfs and iv insulin  for dka  Reevaluation of the patient after these medicines showed that the patient improved I have  reviewed the patients home medicines and have made adjustments as needed   Test Considered:  Ct head   Critical Interventions:  Ivfs and iv insulin   Consultations Obtained:  I requested consultation with the hospitalist (Dr. Flossie Buffy),  and discussed lab and imaging findings as well as pertinent plan - she will admit  Problem List / ED Course:  DKA (mild):  pt started on IVFs and IV insulin AKI:  pt started on IVFs AMS:  improving with IVFs B leg pain:  no evidence of DVT.  Reevaluation:  After the interventions noted above, I reevaluated the patient and found that they have :improved   Social Determinants of Health:  Lives at home   Dispostion:  After consideration of the diagnostic results and the patients response to treatment, I feel that the patent would benefit from admission.    CRITICAL CARE Performed by: Isla Pence   Total critical care time: 30 minutes  Critical care time was exclusive of separately billable procedures and treating other patients.  Critical care was necessary to treat or prevent imminent or life-threatening deterioration.  Critical care was time spent personally by me on the following activities: development of treatment plan with patient and/or surrogate as well as nursing, discussions with consultants, evaluation of patient's response to treatment, examination of patient, obtaining history from patient or surrogate, ordering and performing treatments and interventions, ordering and review of laboratory studies, ordering and review of radiographic studies, pulse oximetry and re-evaluation of patient's condition.         Final Clinical Impression(s) / ED Diagnoses Final diagnoses:  Diabetic ketoacidosis without coma  associated with type 1 diabetes mellitus ([place])  AKI (acute kidney injury) ([place])  Dehydration    Rx / DC Orders ED Discharge Orders     None         Isla Pence, MD 10/31/21 2027

## 2021-10-31 NOTE — ED Triage Notes (Signed)
Pt was brought in from the cancer center with reports of high blood sugar in the 600s. Pt co leg pain

## 2021-10-31 NOTE — Progress Notes (Addendum)
Patient to receive Novolog 10 Units SQ today (+ IVF). Orders entered for Novolog as requested. RN to recheck BS ~ 1 hour after insulin is given.  Raul Del [place], [place], BCPS, BCOP 10/31/2021 2:32 PM

## 2021-10-31 NOTE — Progress Notes (Signed)
CRITICAL VALUE STICKER  CRITICAL VALUE: glucose 647  RECEIVER (on-site recipient of call): Velna Ochs RN  DATE & TIME NOTIFIED: 10/31/21 @ 1412  MESSENGER (representative from lab): Lauren  MD NOTIFIED: Dr Alen Blew  TIME OF NOTIFICATION:1417  RESPONSE:  No chemo today, 10 units of insulin ordered, 500 cc saline bolus, patient is to see his PCP next week.

## 2021-10-31 NOTE — Patient Instructions (Signed)
Hyperglycemia Hyperglycemia occurs when the level of sugar (glucose) in the blood is too high. Glucose is a type of sugar that provides the body's main source of energy. Certain hormones (insulin and glucagon) control the level of glucose in the blood. Insulin lowers blood glucose, and glucagon increases blood glucose. Hyperglycemia can result from not having enough insulin in the bloodstream, or from the body not responding normally to insulin. Hyperglycemia occurs most often in people who have diabetes (diabetes mellitus), but it can happen in people who do not have diabetes. It can develop quickly, and it can be life-threatening if it causes you to become severely dehydrated (diabetic ketoacidosis or hyperglycemic hyperosmolar state). Severe hyperglycemia is a medical emergency. For most people with diabetes, a blood glucose level above 240 mg/dL is considered hyperglycemia. What are the causes? If you have diabetes, hyperglycemia may be caused by: Medicines that increase blood glucose or affect your diabetes control. Getting less physical activity. Eating more than planned. Being sick or injured, having an infection, or having surgery. Stress. Not giving yourself enough insulin (if you are taking insulin). If you have undiagnosed diabetes, this may be the reason you have hyperglycemia. If you do not have diabetes, hyperglycemia may be caused by: Certain medicines, including: Steroid medicines. Beta-blockers. Epinephrine. Thiazide diuretics. Stress. Having a serious illness, an infection, or surgery. Diseases of the pancreas. What increases the risk? Hyperglycemia is more likely to develop in people who have risk factors for diabetes, such as: Having a family member with diabetes. Certain conditions in which the body's disease-fighting system (immune system) attacks itself (autoimmune disorders). Being overweight or obese. Having an inactive (sedentary) lifestyle. Having been diagnosed  with insulin resistance. Having a history of prediabetes, gestational diabetes, or polycystic ovarian syndrome (PCOS). What are the signs or symptoms? Hyperglycemia may not cause any symptoms. If you do have symptoms, they may include: Increased thirst. Needing to urinate more often than usual. Hunger. Feeling very tired. Blurry vision. Other symptoms may develop if hyperglycemia gets worse, such as: Dry mouth. Abdominal pain. Loss of appetite. Fruity-smelling breath. Weakness. Unexpected weight loss. Tingling or numbness in the hands or feet. Headache. Cuts or bruises that are slow to heal. How is this diagnosed? Hyperglycemia is diagnosed with a blood test to measure your blood glucose level. This blood test is usually done while you are having symptoms. Your health care provider may also do a physical exam and review your medical history. You may have more tests to determine the cause of your hyperglycemia, such as: A fasting blood glucose (FBG) test. You will not be allowed to eat (you will fast) for at least 8 hours before a blood sample is taken. An A1C blood test. This provides information about blood glucose control over the previous 2-3 months. An oral glucose tolerance test (OGTT). This measures your blood glucose at two times: After fasting. This is your baseline blood glucose level. 2 hours after drinking a beverage that contains glucose. How is this treated? Treatment depends on the cause of your hyperglycemia. Treatment may include: Taking medicine to regulate your blood glucose levels. If you take insulin or other diabetes medicines, your medicine or dosage may be adjusted. Lifestyle changes, such as exercising more, eating healthier foods, or losing weight. Treating an illness or infection. Checking your blood glucose more often. Stopping or reducing steroid medicines. If your hyperglycemia becomes severe and it results in diabetic ketoacidosis or hyperglycemic  hyperosmolar state, you must be hospitalized and given IV fluids  and IV insulin. Follow these instructions at home: General instructions Take over-the-counter and prescription medicines only as told by your health care provider. Do not use any products that contain nicotine or tobacco. These products include cigarettes, chewing tobacco, and vaping devices, such as e-cigarettes. If you need help quitting, ask your health care provider. If you drink alcohol: Limit how much you have to: 0-1 drink a day for women who are not pregnant. 0-2 drinks a day for men. Know how much alcohol is in a drink. In the [place] one drink equals one 12 oz bottle of beer (355 mL), one 5 oz glass of wine (148 mL), or one 1 oz glass of hard liquor (44 mL). Learn to manage stress. If you need help with this, ask your health care provider. Do exercises as told by your health care provider. Keep all follow-up visits. This is important. Eating and drinking  Maintain a healthy weight. Stay hydrated, especially when you exercise, get sick, or spend time in hot temperatures. Drink enough fluid to keep your urine pale yellow. If you have diabetes:  Know the symptoms of hyperglycemia. Follow your diabetes management plan as told by your health care provider. Make sure you: Take your insulin and medicines as told. Follow your exercise plan. Follow your meal plan. Eat on time, and do not skip meals. Check your blood glucose as often as told. Make sure to check your blood glucose before and after exercise. If you exercise longer or in a different way, check your blood glucose more often. Follow your sick day plan whenever you cannot eat or drink normally. Make this plan in advance with your health care provider. Share your diabetes management plan with people in your workplace, school, and household. Check your urine for ketones when you are ill and as told by your health care provider. Carry a medical alert card or wear  medical alert jewelry. Where to find more information American Diabetes Association: www.diabetes.org Contact a health care provider if: Your blood glucose is at or above 240 mg/dL (13.3 mmol/L) for 2 days in a row. You have problems keeping your blood glucose in your target range. You have frequent episodes of hyperglycemia. You have signs of illness, such as nausea, vomiting, or fever. Get help right away if: Your blood glucose monitor reads "high" even when you are taking insulin. You have trouble breathing. You have a change in how you think, feel, or act (mental status). You have nausea or vomiting that does not go away. These symptoms may represent a serious problem that is an emergency. Do not wait to see if the symptoms will go away. Get medical help right away. Call your local emergency services (911 in the [place] Do not drive yourself to the hospital. Summary Hyperglycemia occurs when the level of sugar (glucose) in the blood is too high. Hyperglycemia can happen with or without diabetes, and severe hyperglycemia can be life-threatening. Hyperglycemia is diagnosed with a blood test to measure your blood glucose level. This blood test is usually done while you are having symptoms. Your health care provider may also do a physical exam and review your medical history. If you have diabetes, follow your diabetes management plan as told by your health care provider. Contact your health care provider if you have problems keeping your blood glucose in your target range. This information is not intended to replace advice given to you by your health care provider. Make sure you discuss any questions you have  with your health care provider. Document Revised: 03/15/2020 Document Reviewed: 03/15/2020 Elsevier Patient Education  [place].

## 2021-10-31 NOTE — Progress Notes (Addendum)
Pt CBG found to be 576 on recheck at 1541 after 1L NS and 10U insulin. Pt reports he has not eaten in 10 days and has only drank water. MD notified and requested for pt to be seen in the ED. Pt declined at first but then agreed. Report called to [facility] in ED. Pt PAC saline locked and transported via wheelchair to ED, bedside report given.

## 2021-10-31 NOTE — Progress Notes (Unsigned)
CRITICAL ALERT VALUE GLUCOSE: [id number] CALLED TO, READ BACK, AND VERIFIED WITH JUDY LOWE @ 1413 ON 05.19.2023 BY Ander Purpura [place]

## 2021-10-31 NOTE — ED Notes (Signed)
DO Tu advised that she will evaluate patient when he gets upstairs to ICU.  Patient currently enroute.

## 2021-10-31 NOTE — ED Notes (Signed)
Attempted to call report to ICU x 1.

## 2021-10-31 NOTE — Assessment & Plan Note (Addendum)
-  s/p TURBT  -Follows with Dr. Alen Blew with oncology -Previous completed chemotherapy and now on salvage therapy with Padcev. Need discussion with oncology regarding therapy going forward since pt is not tolerating it well.

## 2021-10-31 NOTE — ED Notes (Signed)
Attempted to call report to ICU x2.

## 2021-10-31 NOTE — Progress Notes (Signed)
MD made aware of patient's recent weight loss and reported inability to eat. Dr Alen Blew requested that patient go to ED if he continues to have difficulty eating tonight and over the weekend. Patient informed and verbalized understanding. Per patient request, spoke with his cousin, Bert Ptacek, about this and patient's elevated blood glucose. Report given to Lulu Riding, RN who will recheck FSBS around 1540. (1450)  Dr Alen Blew at chairside (1520).

## 2021-10-31 NOTE — Assessment & Plan Note (Addendum)
QTc of 503 initially with repeat of 458 Avoid QT prolongation meds.

## 2021-10-31 NOTE — ED Notes (Signed)
This RN noted that patient pulled out his IV access and port access.  Patient's port re-accessed and IV re-started.

## 2021-10-31 NOTE — Assessment & Plan Note (Addendum)
Mild early DKA- pH is normal but with increase gap and elevated beta hydroxybutyrate.  CBG of greater than 500. -No known diagnosis of diabetes.  Likely secondary to effects of his salvage therapy Padcev -transitioned off insulin gtt to subq insulin per above -Recommend close outpatient follow up for insulin titration to either increase or decrease dosage as needed.

## 2021-10-31 NOTE — Assessment & Plan Note (Addendum)
WBC at 3.6. Likely reactive to therapy. -WBC down to 2.5 -remained stable

## 2021-10-31 NOTE — H&P (Signed)
History and Physical    Patient: Javier Sellers NFA:[id number] DOB: 11-17-1950 DOA: 10/31/2021 DOS: the patient was seen and examined on 11/01/2021 PCP: [name], MD  Patient coming from: Home  Chief Complaint:  Chief Complaint  Patient presents with   Hyperglycemia   HPI: Javier Sellers is a 71 y.o. 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   Recently finished his first cycle of salvage therapy Padcev for on 10/17/21 and since then has felt weak, unable to walk, has memory loss, decrease appetite and nausea. Has lower abdominal pain in the last few days. Today presents to oncology for second infusion but noted to be hyperglycemia up to 600. Received IVF and 10U insulin and was sent to ED.   In the ED,he was afebrile, normotensive on room air. BG greater than 500 with no anion gap but upward trending upwards to 13, with normal pH. Elevated beta-hydroxy at 2.32. AKI creatinine of 2.23 from 1.16.  Leukopenia of 3.6.    Review of Systems: As mentioned in the history of present illness. All other systems reviewed and are negative. Past Medical History:  Diagnosis Date   Complication of anesthesia    vomiting only with ether   High cholesterol    Hypertension    Past Surgical History:  Procedure Laterality Date   APPENDECTOMY     BRONCHIAL BIOPSY  12/20/2020   Procedure: BRONCHIAL BIOPSIES;  Surgeon: Garner Nash, DO;  Location: [facility];  Service: Pulmonary;;   BRONCHIAL BRUSHINGS  12/20/2020   Procedure: BRONCHIAL BRUSHINGS;  Surgeon: Garner Nash, DO;  Location: [facility];  Service: Pulmonary;;   BRONCHIAL NEEDLE ASPIRATION BIOPSY  12/20/2020   Procedure: BRONCHIAL NEEDLE ASPIRATION BIOPSIES;  Surgeon: Garner Nash, DO;  Location: [facility];   Service: Pulmonary;;   BRONCHIAL WASHINGS  12/20/2020   Procedure: BRONCHIAL WASHINGS;  Surgeon: Garner Nash, DO;  Location: [facility];  Service: Pulmonary;;   FIDUCIAL MARKER PLACEMENT  12/20/2020   Procedure: FIDUCIAL MARKER PLACEMENT;  Surgeon: Garner Nash, DO;  Location: [facility];  Service: Pulmonary;;   IR IMAGING GUIDED PORT INSERTION  12/26/2020   ROTATOR CUFF REPAIR Bilateral    VIDEO BRONCHOSCOPY WITH ENDOBRONCHIAL NAVIGATION Left 12/20/2020   Procedure: VIDEO BRONCHOSCOPY WITH ENDOBRONCHIAL NAVIGATION;  Surgeon: Garner Nash, DO;  Location: [facility];  Service: Pulmonary;  Laterality: Left;   Social History:  reports that he quit smoking about 30 years ago. His smoking use included cigarettes. He has a 70.00 pack-year smoking history. He has never used smokeless tobacco. He reports current alcohol use. He reports that he does not use drugs.  No Known Allergies  No family history on file.  Prior to Admission medications   Medication Sig Start Date End Date Taking? Authorizing Provider  amLODipine (NORVASC) 5 MG tablet Take 5 mg by mouth every morning. 09/10/20   [provider]  diclofenac Sodium (VOLTAREN) 1 % GEL Apply 1 application topically daily as needed (knee pain).    [provider]  lidocaine-prilocaine (EMLA) cream Apply 1 application topically as needed. 12/13/20   Wyatt Portela, MD  lisinopril (ZESTRIL) 40 MG tablet Take 40 mg by mouth every morning. 09/10/20   [provider]  Menthol-Camphor (ICY HOT ADVANCED PAIN RELIEF) 16-11 % CREA Apply 1  application topically daily as needed (pain).    [provider]  metoprolol succinate (TOPROL-XL) 100 MG 24 hr tablet Take 100 mg by mouth every morning. 09/10/20   [provider]  pravastatin (PRAVACHOL) 80 MG tablet Take 80 mg by mouth at bedtime. 09/10/20   [provider]  prochlorperazine (COMPAZINE) 10 MG tablet Take 1 tablet (10 mg total) by mouth every 6  (six) hours as needed for nausea or vomiting. 12/13/20   Wyatt Portela, MD  tamsulosin (FLOMAX) 0.4 MG CAPS capsule Take 0.4 mg by mouth daily. 10/23/20   [provider]    Physical Exam: Vitals:   10/31/21 2029 10/31/21 2108 10/31/21 2300 11/01/21 0000  BP:  (!) 144/64 125/64 (!) 144/56  Pulse:  100 99 98  Resp:  16  19  Temp: 98 F (36.7 C)   99.8 F (37.7 C)  TempSrc: Oral   Oral  SpO2:  98% 100% 99%  Weight:      Height:       Constitutional: NAD, calm, comfortable, ill-appearing elderly male lying flat in bed Eyes:  lids and conjunctivae normal ENMT: Mucous membranes are moist. Neck: normal, supple,  Respiratory: clear to auscultation bilaterally, no wheezing, no crackles. Normal respiratory effort. No accessory muscle use.  Cardiovascular: Regular rate and rhythm, no murmurs / rubs / gallops. No extremity edema.   Abdomen: Soft, nontender distended with some anterior bulging of the abdomen.  Bowel sounds positive.  Musculoskeletal: no clubbing / cyanosis. No joint deformity upper and lower extremities. Good ROM, no contractures. Normal muscle tone.  Skin: no rashes, lesions, ulcers.  Neurologic: CN 2-12 grossly intact. Strength 5/5 in all 4.  Psychiatric: Normal judgment and insight. Alert and oriented x 3. Normal mood. Data Reviewed:  See HPI  Assessment and Plan: * DKA (diabetic ketoacidosis) ([place]) Mild early DKA- pH is normal but with increase gap and elevated beta hydroxybutyrate.  CBG of greater than 500. -No known diagnosis of diabetes.  Likely secondary to effects of his salvage therapy Padcev -Check hemoglobin A1c -- replete potassium - continue insulin gtt with goal of 140-180 and AG <12 - IV NS until BG <250, then switch to D5 1/2 NS  - BMP q4hr  - keep NPO   Acute metabolic encephalopathy Pt reports increase memory issue since starting salvage therapy with oncology 2 weeks ago. Later notified by nursing that he has worsen AMS and was  hallucinating about wife at bedside. However no new fever. CT head was reassuring. CBG remains high at >500. Suspect could be worsening DKA. Delayed in BMP being drawn since still waiting on repeat. Pt still calm and redirectable. Will continue to monitor closely overnight.   AKI (acute kidney injury) ([place]) Creatinine elevated to 2.23 from 1.16. Monitor with continuous IV fluid.  Prolonged QT interval QTc of 503.  Avoid QT prolongation meds. PRN low dose IV ativan for any nausea  Leukopenia WBC at 3.6. Likely reactive to therapy.  NSCLC of left lung ([place]) Follows with oncology  Malignant neoplasm of urinary bladder ([place]) -s/p TURBT  -Follows with Dr. Alen Blew with oncology -Previous completed chemotherapy and now on salvage therapy with Padcev. Need discussion with oncology regarding therapy going forward since pt is not tolerating it well.       Advance Care Planning:   Code Status: Prior   Consults: none  Family Communication: No family at bedside  Severity of Illness: The appropriate patient status for this patient is OBSERVATION. Observation status  is judged to be reasonable and necessary in order to provide the required intensity of service to ensure the patient's safety. The patient's presenting symptoms, physical exam findings, and initial radiographic and laboratory data in the context of their medical condition is felt to place them at decreased risk for further clinical deterioration. Furthermore, it is anticipated that the patient will be medically stable for discharge from the hospital within 2 midnights of admission.   Author: Orene Desanctis, DO 11/01/2021 1:09 AM  For on call review [email or web address].

## 2021-10-31 NOTE — ED Notes (Signed)
Patient has no complaints at this time.  

## 2021-10-31 NOTE — Assessment & Plan Note (Signed)
Follows with oncology

## 2021-11-01 DIAGNOSIS — E1010 Type 1 diabetes mellitus with ketoacidosis without coma: Secondary | ICD-10-CM

## 2021-11-01 DIAGNOSIS — Z87891 Personal history of nicotine dependence: Secondary | ICD-10-CM | POA: Diagnosis not present

## 2021-11-01 DIAGNOSIS — I10 Essential (primary) hypertension: Secondary | ICD-10-CM | POA: Diagnosis present

## 2021-11-01 DIAGNOSIS — E876 Hypokalemia: Secondary | ICD-10-CM

## 2021-11-01 DIAGNOSIS — E860 Dehydration: Secondary | ICD-10-CM | POA: Diagnosis present

## 2021-11-01 DIAGNOSIS — C3492 Malignant neoplasm of unspecified part of left bronchus or lung: Secondary | ICD-10-CM | POA: Diagnosis present

## 2021-11-01 DIAGNOSIS — C787 Secondary malignant neoplasm of liver and intrahepatic bile duct: Secondary | ICD-10-CM | POA: Diagnosis present

## 2021-11-01 DIAGNOSIS — C679 Malignant neoplasm of bladder, unspecified: Secondary | ICD-10-CM | POA: Diagnosis present

## 2021-11-01 DIAGNOSIS — Z9049 Acquired absence of other specified parts of digestive tract: Secondary | ICD-10-CM | POA: Diagnosis not present

## 2021-11-01 DIAGNOSIS — Z79899 Other long term (current) drug therapy: Secondary | ICD-10-CM | POA: Diagnosis not present

## 2021-11-01 DIAGNOSIS — M79605 Pain in left leg: Secondary | ICD-10-CM | POA: Diagnosis present

## 2021-11-01 DIAGNOSIS — G9341 Metabolic encephalopathy: Secondary | ICD-10-CM | POA: Diagnosis present

## 2021-11-01 DIAGNOSIS — M79604 Pain in right leg: Secondary | ICD-10-CM | POA: Diagnosis present

## 2021-11-01 DIAGNOSIS — Z9221 Personal history of antineoplastic chemotherapy: Secondary | ICD-10-CM | POA: Diagnosis not present

## 2021-11-01 DIAGNOSIS — E7800 Pure hypercholesterolemia, unspecified: Secondary | ICD-10-CM | POA: Diagnosis present

## 2021-11-01 DIAGNOSIS — N179 Acute kidney failure, unspecified: Secondary | ICD-10-CM | POA: Diagnosis present

## 2021-11-01 LAB — BASIC METABOLIC PANEL
Anion gap: 9 (ref 5–15)
Anion gap: 7 (ref 5–15)
Anion gap: 9 (ref 5–15)
Anion gap: 9 (ref 5–15)
BUN: 27 mg/dL — ABNORMAL HIGH (ref 8–23)
BUN: 26 mg/dL — ABNORMAL HIGH (ref 8–23)
BUN: 25 mg/dL — ABNORMAL HIGH (ref 8–23)
BUN: 22 mg/dL (ref 8–23)
CO2: 22 mmol/L (ref 22–32)
CO2: 24 mmol/L (ref 22–32)
CO2: 23 mmol/L (ref 22–32)
CO2: 22 mmol/L (ref 22–32)
Calcium: 8.3 mg/dL — ABNORMAL LOW (ref 8.9–10.3)
Calcium: 8.6 mg/dL — ABNORMAL LOW (ref 8.9–10.3)
Calcium: 8.5 mg/dL — ABNORMAL LOW (ref 8.9–10.3)
Calcium: 8.6 mg/dL — ABNORMAL LOW (ref 8.9–10.3)
Chloride: 102 mmol/L (ref 98–111)
Chloride: 106 mmol/L (ref 98–111)
Chloride: 106 mmol/L (ref 98–111)
Chloride: 106 mmol/L (ref 98–111)
Creatinine, Ser: 2 mg/dL — ABNORMAL HIGH (ref 0.61–1.24)
Creatinine, Ser: 1.95 mg/dL — ABNORMAL HIGH (ref 0.61–1.24)
Creatinine, Ser: 1.7 mg/dL — ABNORMAL HIGH (ref 0.61–1.24)
Creatinine, Ser: 1.52 mg/dL — ABNORMAL HIGH (ref 0.61–1.24)
GFR, Estimated: 35 mL/min — ABNORMAL LOW (ref >60)
GFR, Estimated: 37 mL/min — ABNORMAL LOW (ref >60)
GFR, Estimated: 43 mL/min — ABNORMAL LOW (ref >60)
GFR, Estimated: 49 mL/min — ABNORMAL LOW (ref >60)
Glucose, Bld: 395 mg/dL — ABNORMAL HIGH (ref 70–99)
Glucose, Bld: 214 mg/dL — ABNORMAL HIGH (ref 70–99)
Glucose, Bld: 186 mg/dL — ABNORMAL HIGH (ref 70–99)
Glucose, Bld: 181 mg/dL — ABNORMAL HIGH (ref 70–99)
Potassium: 2.9 mmol/L — ABNORMAL LOW (ref 3.5–5.1)
Potassium: 2.7 mmol/L — CL (ref 3.5–5.1)
Potassium: 2.7 mmol/L — CL (ref 3.5–5.1)
Potassium: 3.2 mmol/L — ABNORMAL LOW (ref 3.5–5.1)
Sodium: 133 mmol/L — ABNORMAL LOW (ref 135–145)
Sodium: 137 mmol/L (ref 135–145)
Sodium: 138 mmol/L (ref 135–145)
Sodium: 137 mmol/L (ref 135–145)

## 2021-11-01 LAB — CBC
HCT: 28.3 % — ABNORMAL LOW (ref 39.0–52.0)
Hemoglobin: 10.1 g/dL — ABNORMAL LOW (ref 13.0–17.0)
MCH: 32.8 pg (ref 26.0–34.0)
MCHC: 35.7 g/dL (ref 30.0–36.0)
MCV: 91.9 fL (ref 80.0–100.0)
Platelets: 101 10*3/uL — ABNORMAL LOW (ref 150–400)
RBC: 3.08 MIL/uL — ABNORMAL LOW (ref 4.22–5.81)
RDW: 16.8 % — ABNORMAL HIGH (ref 11.5–15.5)
WBC: 2.5 10*3/uL — ABNORMAL LOW (ref 4.0–10.5)
nRBC: 0.8 % — ABNORMAL HIGH (ref 0.0–0.2)

## 2021-11-01 LAB — URINALYSIS, ROUTINE W REFLEX MICROSCOPIC
Bilirubin Urine: NEGATIVE
Glucose, UA: >=500 mg/dL — AB
Hgb urine dipstick: NEGATIVE
Ketones, ur: 5 mg/dL — AB
Leukocytes,Ua: NEGATIVE
Nitrite: NEGATIVE
Protein, ur: NEGATIVE mg/dL
Specific Gravity, Urine: 1.022 (ref 1.005–1.030)
pH: 5 (ref 5.0–8.0)

## 2021-11-01 LAB — HEMOGLOBIN A1C
Hgb A1c MFr Bld: 8.9 % — ABNORMAL HIGH (ref 4.8–5.6)
Mean Plasma Glucose: 208.73 mg/dL

## 2021-11-01 LAB — GLUCOSE, CAPILLARY
Glucose-Capillary: 165 mg/dL — ABNORMAL HIGH (ref 70–99)
Glucose-Capillary: 174 mg/dL — ABNORMAL HIGH (ref 70–99)
Glucose-Capillary: 174 mg/dL — ABNORMAL HIGH (ref 70–99)
Glucose-Capillary: 183 mg/dL — ABNORMAL HIGH (ref 70–99)
Glucose-Capillary: 183 mg/dL — ABNORMAL HIGH (ref 70–99)
Glucose-Capillary: 185 mg/dL — ABNORMAL HIGH (ref 70–99)
Glucose-Capillary: 191 mg/dL — ABNORMAL HIGH (ref 70–99)
Glucose-Capillary: 192 mg/dL — ABNORMAL HIGH (ref 70–99)
Glucose-Capillary: 194 mg/dL — ABNORMAL HIGH (ref 70–99)
Glucose-Capillary: 203 mg/dL — ABNORMAL HIGH (ref 70–99)
Glucose-Capillary: 208 mg/dL — ABNORMAL HIGH (ref 70–99)
Glucose-Capillary: 209 mg/dL — ABNORMAL HIGH (ref 70–99)
Glucose-Capillary: 232 mg/dL — ABNORMAL HIGH (ref 70–99)
Glucose-Capillary: 244 mg/dL — ABNORMAL HIGH (ref 70–99)
Glucose-Capillary: 299 mg/dL — ABNORMAL HIGH (ref 70–99)
Glucose-Capillary: 331 mg/dL — ABNORMAL HIGH (ref 70–99)
Glucose-Capillary: 395 mg/dL — ABNORMAL HIGH (ref 70–99)
Glucose-Capillary: 430 mg/dL — ABNORMAL HIGH (ref 70–99)

## 2021-11-01 LAB — BETA-HYDROXYBUTYRIC ACID
Beta-Hydroxybutyric Acid: 0.8 mmol/L — ABNORMAL HIGH (ref 0.05–0.27)
Beta-Hydroxybutyric Acid: 0.22 mmol/L (ref 0.05–0.27)
Beta-Hydroxybutyric Acid: 0.2 mmol/L (ref 0.05–0.27)

## 2021-11-01 LAB — MRSA NEXT GEN BY PCR, NASAL: MRSA by PCR Next Gen: NOT DETECTED

## 2021-11-01 LAB — MAGNESIUM: Magnesium: 1.6 mg/dL — ABNORMAL LOW (ref 1.7–2.4)

## 2021-11-01 MED ORDER — GABAPENTIN 300 MG PO CAPS
300.0000 mg | ORAL_CAPSULE | Freq: Every day | ORAL | Status: DC
  Administered 2021-11-02: 300 mg via ORAL
  Filled 2021-11-01: qty 1

## 2021-11-01 MED ORDER — SODIUM CHLORIDE 0.9% FLUSH: 10.0000 mL | INTRAVENOUS | Status: DC | PRN

## 2021-11-01 MED ORDER — POTASSIUM CHLORIDE CRYS ER 20 MEQ PO TBCR
60.0000 meq | EXTENDED_RELEASE_TABLET | ORAL | Status: AC
  Administered 2021-11-01 (×2): 60 meq via ORAL
  Filled 2021-11-01 (×2): qty 3

## 2021-11-01 MED ORDER — POTASSIUM CHLORIDE 10 MEQ/100ML IV SOLN
10.0000 meq | INTRAVENOUS | Status: AC
  Administered 2021-11-01 (×3): 10 meq via INTRAVENOUS
  Filled 2021-11-01 (×3): qty 100

## 2021-11-01 MED ORDER — INSULIN GLARGINE-YFGN 100 UNIT/ML ~~LOC~~ SOLN
15.0000 [IU] | Freq: Every day | SUBCUTANEOUS | Status: DC
  Administered 2021-11-01 – 2021-11-02 (×2): 15 [IU] via SUBCUTANEOUS
  Filled 2021-11-01 (×2): qty 0.15

## 2021-11-01 MED ORDER — MAGNESIUM SULFATE 2 GM/50ML IV SOLN
2.0000 g | Freq: Once | INTRAVENOUS | Status: AC
  Administered 2021-11-01: 2 g via INTRAVENOUS
  Filled 2021-11-01: qty 50

## 2021-11-01 MED ORDER — AMLODIPINE BESYLATE 5 MG PO TABS
5.0000 mg | ORAL_TABLET | Freq: Every morning | ORAL | Status: DC
  Administered 2021-11-02: 5 mg via ORAL
  Filled 2021-11-01: qty 1

## 2021-11-01 MED ORDER — LEVOTHYROXINE SODIUM 25 MCG PO TABS
25.0000 ug | ORAL_TABLET | Freq: Every day | ORAL | Status: DC
  Administered 2021-11-02: 25 ug via ORAL
  Filled 2021-11-01: qty 1

## 2021-11-01 MED ORDER — METOPROLOL SUCCINATE ER 25 MG PO TB24
100.0000 mg | ORAL_TABLET | Freq: Every morning | ORAL | Status: DC
  Administered 2021-11-01 – 2021-11-02 (×2): 100 mg via ORAL
  Filled 2021-11-01 (×2): qty 4

## 2021-11-01 MED ORDER — SODIUM CHLORIDE 0.9% FLUSH
10.0000 mL | Freq: Two times a day (BID) | INTRAVENOUS | Status: DC
  Administered 2021-11-01 – 2021-11-02 (×3): 10 mL

## 2021-11-01 MED ORDER — PRAVASTATIN SODIUM 20 MG PO TABS
80.0000 mg | ORAL_TABLET | Freq: Every day | ORAL | Status: DC
  Administered 2021-11-01: 80 mg via ORAL
  Filled 2021-11-01: qty 4

## 2021-11-01 MED ORDER — HEPARIN SODIUM (PORCINE) 5000 UNIT/ML IJ SOLN
5000.0000 [IU] | Freq: Three times a day (TID) | INTRAMUSCULAR | Status: DC
  Administered 2021-11-01 – 2021-11-02 (×5): 5000 [IU] via SUBCUTANEOUS
  Filled 2021-11-01 (×5): qty 1

## 2021-11-01 MED ORDER — INSULIN ASPART 100 UNIT/ML IJ SOLN: 0.0000 [IU] | Freq: Every day | INTRAMUSCULAR | Status: DC

## 2021-11-01 MED ORDER — CHLORHEXIDINE GLUCONATE CLOTH 2 % EX PADS
6.0000 | MEDICATED_PAD | Freq: Every day | CUTANEOUS | Status: DC
  Administered 2021-11-02: 6 via TOPICAL

## 2021-11-01 MED ORDER — TAMSULOSIN HCL 0.4 MG PO CAPS
0.4000 mg | ORAL_CAPSULE | Freq: Every day | ORAL | Status: DC
  Administered 2021-11-01 – 2021-11-02 (×2): 0.4 mg via ORAL
  Filled 2021-11-01 (×2): qty 1

## 2021-11-01 MED ORDER — INSULIN ASPART 100 UNIT/ML IJ SOLN
0.0000 [IU] | Freq: Three times a day (TID) | INTRAMUSCULAR | Status: DC
  Administered 2021-11-01: 3 [IU] via SUBCUTANEOUS
  Administered 2021-11-02 (×3): 5 [IU] via SUBCUTANEOUS

## 2021-11-01 MED ORDER — HYDRALAZINE HCL 20 MG/ML IJ SOLN
10.0000 mg | INTRAMUSCULAR | Status: DC | PRN
  Administered 2021-11-02: 10 mg via INTRAVENOUS
  Filled 2021-11-01: qty 1

## 2021-11-01 NOTE — Assessment & Plan Note (Addendum)
-  replaced

## 2021-11-01 NOTE — Plan of Care (Signed)
  Problem: Education: Goal: Ability to describe self-care measures that may prevent or decrease complications (Diabetes Survival Skills Education) will improve Outcome: Progressing Goal: Individualized Educational Video(s) Outcome: Progressing   Problem: Coping: Goal: Ability to adjust to condition or change in health will improve Outcome: Progressing   Problem: Fluid Volume: Goal: Ability to maintain a balanced intake and output will improve Outcome: Progressing   Problem: Health Behavior/Discharge Planning: Goal: Ability to identify and utilize available resources and services will improve Outcome: Progressing Goal: Ability to manage health-related needs will improve Outcome: Progressing   Problem: Metabolic: Goal: Ability to maintain appropriate glucose levels will improve Outcome: Progressing   Problem: Nutritional: Goal: Maintenance of adequate nutrition will improve Outcome: Progressing Goal: Progress toward achieving an optimal weight will improve Outcome: Progressing   Problem: Skin Integrity: Goal: Risk for impaired skin integrity will decrease Outcome: Progressing   Problem: Tissue Perfusion: Goal: Adequacy of tissue perfusion will improve Outcome: Progressing   Problem: Education: Goal: Ability to describe self-care measures that may prevent or decrease complications (Diabetes Survival Skills Education) will improve Outcome: Progressing Goal: Individualized Educational Video(s) Outcome: Progressing   Problem: Cardiac: Goal: Ability to maintain an adequate cardiac output will improve Outcome: Progressing   Problem: Health Behavior/Discharge Planning: Goal: Ability to identify and utilize available resources and services will improve Outcome: Progressing Goal: Ability to manage health-related needs will improve Outcome: Progressing   Problem: Fluid Volume: Goal: Ability to achieve a balanced intake and output will improve Outcome: Progressing    Problem: Metabolic: Goal: Ability to maintain appropriate glucose levels will improve Outcome: Progressing   Problem: Nutritional: Goal: Maintenance of adequate nutrition will improve Outcome: Progressing Goal: Maintenance of adequate weight for body size and type will improve Outcome: Progressing   Problem: Respiratory: Goal: Will regain and/or maintain adequate ventilation Outcome: Progressing   Problem: Urinary Elimination: Goal: Ability to achieve and maintain adequate renal perfusion and functioning will improve Outcome: Progressing

## 2021-11-01 NOTE — Hospital Course (Signed)
71 y.o. 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    Recently finished his first cycle of salvage therapy Padcev for on 10/17/21 and since then has felt weak, unable to walk, has memory loss, decrease appetite and nausea. Has lower abdominal pain in the last few days.  In the ED,he was afebrile, normotensive on room air. BG greater than 500 with no anion gap but upward trending upwards to 13, with normal pH. Elevated beta-hydroxy at 2.32. AKI creatinine of 2.23 from 1.16.  Leukopenia of 3.6.

## 2021-11-01 NOTE — Progress Notes (Addendum)
Progress Note   Patient: Javier Sellers DVV:[id number] DOB: 02-27-1951 DOA: 10/31/2021     0 DOS: the patient was seen and examined on 11/01/2021   Brief hospital course: 71 y.o. 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    Recently finished his first cycle of salvage therapy Padcev for on 10/17/21 and since then has felt weak, unable to walk, has memory loss, decrease appetite and nausea. Has lower abdominal pain in the last few days.  In the ED,he was afebrile, normotensive on room air. BG greater than 500 with no anion gap but upward trending upwards to 13, with normal pH. Elevated beta-hydroxy at 2.32. AKI creatinine of 2.23 from 1.16.  Leukopenia of 3.6.  Assessment and Plan: * DKA (diabetic ketoacidosis) ([place]) Mild early DKA- pH is normal but with increase gap and elevated beta hydroxybutyrate.  CBG of greater than 500. -No known diagnosis of diabetes.  Likely secondary to effects of his salvage therapy Padcev -Check hemoglobin A1c -- replete potassium - continue insulin gtt with goal of 140-180 and AG <12 - IV NS until BG <250, then switch to D5 1/2 NS  - BMP q4hr  - keep NPO   Primary hypomagnesemia -replaced -Cont to follow and replace Mg as needed  Hypokalemia -will replace -Repeat bmet in AM  Acute metabolic encephalopathy -Pt noted increase memory issue since starting salvage therapy with oncology 2 weeks ago.  -CT head noted to be unremarkable was reassuring. CBG remains high at >500. Suspect could be  -Presented with marked hyperglycemia with GAP of 15 -Gap closed and insulin stabilized with insulin gtt and IVF -Have transitioned to 15u semglee daily with SSI coverage -diabetic coordinator following  AKI (acute kidney injury) ([place]) -Creatinine elevated  to 2.23 from 1.16. Monitor with continuous IV fluid. -Cr improving with IVF -Repeat bmet in AM  Prolonged QT interval QTc of 503 initially with repeat of 458 Avoid QT prolongation meds. PRN low dose IV ativan for any nausea  Leukopenia WBC at 3.6. Likely reactive to therapy. -WBC down to 2.5 -Repeat cbc in AM  NSCLC of left lung ([place]) Follows with oncology  Malignant neoplasm of urinary bladder ([place]) -s/p TURBT  -Follows with Dr. Alen Blew with oncology -Previous completed chemotherapy and now on salvage therapy with Padcev. Need discussion with oncology regarding therapy going forward since pt is not tolerating it well.       Subjective: Reports feeling better today. Somewhat confused this AM  Physical Exam: Vitals:   11/01/21 0500 11/01/21 0600 11/01/21 0800 11/01/21 1200  BP: (!) 184/74     Pulse: 94 99    Resp: 17 15    Temp:   98.1 F (36.7 C) 97.8 F (36.6 C)  TempSrc:   Oral Oral  SpO2: 100% 100%    Weight:      Height:       General exam: Awake, laying in bed, in nad Respiratory system: Normal respiratory effort, no wheezing Cardiovascular system: regular rate, s1, s2 Gastrointestinal system: Soft, nondistended, positive BS Central nervous system: CN2-12 grossly intact, strength intact Extremities: Perfused, no clubbing Skin: Normal skin turgor, no notable skin lesions seen Psychiatry: Mood normal // no visual hallucinations   Data Reviewed:  Labs reviewed: K 3.2, Cr 1.52  Family Communication: Pt in room, family not at bedside  Disposition: Status is: Observation The patient will require care spanning > 2 midnights and should be moved to inpatient because: Severity of illness  Planned Discharge Destination: Home     Author: Marylu Lund, MD 11/01/2021 4:29 PM  For on call review [email or web address].

## 2021-11-01 NOTE — Assessment & Plan Note (Addendum)
-  Pt noted increase memory issue since starting salvage therapy with oncology 2 weeks ago.  -CT head noted to be unremarkable -Presented with marked hyperglycemia with GAP of 15 -Gap closed and insulin stabilized with insulin gtt and IVF -Have transitioned to 20u semglee daily with SSI coverage while in hospital -diabetic coordinator following, recommend continued long acting insulin on d/c, goal for close f/u for insulin titration -Unclear if beta cells are remained intact, thus allowing for less dependency on subQ insulin over time

## 2021-11-01 NOTE — Progress Notes (Signed)
Inpatient Diabetes Program Recommendations  AACE/ADA: New Consensus Statement on Inpatient Glycemic Control (2015)  Target Ranges:  Prepandial:   less than 140 mg/dL      Peak postprandial:   less than 180 mg/dL (1-2 hours)      Critically ill patients:  140 - 180 mg/dL   Lab Results  Component Value Date   GLUCAP 191 (H) 11/01/2021   HGBA1C 8.9 (H) 10/31/2021    Review of Glycemic Control  Diabetes history: None, New diagnosis this admission Cancer treatments unsure if steroid use is being utilized. Current orders for Inpatient glycemic control:  Semglee 15 units Daily Novolog 0-15 units tid + hs  A1c 8.9% on 5/19  Inpatient Diabetes Program Recommendations:    Watch on current insulin regimen  Pt confused per RN report, Wife not at bedside. Will reevaluate tomorrow on 5/21 for Diabetes and insulin education.  Thanks,  Tama Headings RN, MSN, BC-ADM Inpatient Diabetes Coordinator Team Pager [phone] (8a-5p)

## 2021-11-02 DIAGNOSIS — N179 Acute kidney failure, unspecified: Secondary | ICD-10-CM | POA: Diagnosis not present

## 2021-11-02 DIAGNOSIS — E1010 Type 1 diabetes mellitus with ketoacidosis without coma: Secondary | ICD-10-CM | POA: Diagnosis not present

## 2021-11-02 LAB — CBC
HCT: 30.4 % — ABNORMAL LOW (ref 39.0–52.0)
Hemoglobin: 10.2 g/dL — ABNORMAL LOW (ref 13.0–17.0)
MCH: 31.9 pg (ref 26.0–34.0)
MCHC: 33.6 g/dL (ref 30.0–36.0)
MCV: 95 fL (ref 80.0–100.0)
Platelets: 101 10*3/uL — ABNORMAL LOW (ref 150–400)
RBC: 3.2 MIL/uL — ABNORMAL LOW (ref 4.22–5.81)
RDW: 16.8 % — ABNORMAL HIGH (ref 11.5–15.5)
WBC: 2.4 10*3/uL — ABNORMAL LOW (ref 4.0–10.5)
nRBC: 1.3 % — ABNORMAL HIGH (ref 0.0–0.2)

## 2021-11-02 LAB — COMPREHENSIVE METABOLIC PANEL
ALT: 51 U/L — ABNORMAL HIGH (ref 0–44)
AST: 75 U/L — ABNORMAL HIGH (ref 15–41)
Albumin: 2.9 g/dL — ABNORMAL LOW (ref 3.5–5.0)
Alkaline Phosphatase: 98 U/L (ref 38–126)
Anion gap: 8 (ref 5–15)
BUN: 18 mg/dL (ref 8–23)
CO2: 23 mmol/L (ref 22–32)
Calcium: 8.5 mg/dL — ABNORMAL LOW (ref 8.9–10.3)
Chloride: 107 mmol/L (ref 98–111)
Creatinine, Ser: 1.46 mg/dL — ABNORMAL HIGH (ref 0.61–1.24)
GFR, Estimated: 52 mL/min — ABNORMAL LOW (ref >60)
Glucose, Bld: 226 mg/dL — ABNORMAL HIGH (ref 70–99)
Potassium: 3.7 mmol/L (ref 3.5–5.1)
Sodium: 138 mmol/L (ref 135–145)
Total Bilirubin: 1.1 mg/dL (ref 0.3–1.2)
Total Protein: 5.8 g/dL — ABNORMAL LOW (ref 6.5–8.1)

## 2021-11-02 LAB — GLUCOSE, CAPILLARY
Glucose-Capillary: 249 mg/dL — ABNORMAL HIGH (ref 70–99)
Glucose-Capillary: 245 mg/dL — ABNORMAL HIGH (ref 70–99)
Glucose-Capillary: 233 mg/dL — ABNORMAL HIGH (ref 70–99)
Glucose-Capillary: 220 mg/dL — ABNORMAL HIGH (ref 70–99)

## 2021-11-02 LAB — MAGNESIUM: Magnesium: 2 mg/dL (ref 1.7–2.4)

## 2021-11-02 MED ORDER — INSULIN GLARGINE-YFGN 100 UNIT/ML ~~LOC~~ SOLN
5.0000 [IU] | Freq: Once | SUBCUTANEOUS | Status: AC
  Administered 2021-11-02: 5 [IU] via SUBCUTANEOUS
  Filled 2021-11-02: qty 0.05

## 2021-11-02 MED ORDER — BLOOD GLUCOSE MONITOR KIT
PACK | 0 refills | Status: AC
Start: 2021-11-02 — End: ?

## 2021-11-02 MED ORDER — INSULIN GLARGINE 100 UNIT/ML SOLOSTAR PEN: 20.0000 [IU] | PEN_INJECTOR | Freq: Every day | SUBCUTANEOUS | 0 refills | Status: AC

## 2021-11-02 MED ORDER — HEPARIN SOD (PORK) LOCK FLUSH 100 UNIT/ML IV SOLN
500.0000 [IU] | INTRAVENOUS | Status: AC | PRN
  Administered 2021-11-02: 500 [IU]

## 2021-11-02 MED ORDER — AMLODIPINE BESYLATE 10 MG PO TABS: 10.0000 mg | ORAL_TABLET | Freq: Every day | ORAL | 0 refills | Status: AC

## 2021-11-02 MED ORDER — INSULIN GLARGINE-YFGN 100 UNIT/ML ~~LOC~~ SOLN: 20.0000 [IU] | Freq: Every day | SUBCUTANEOUS | Status: DC

## 2021-11-02 MED ORDER — INSULIN PEN NEEDLE 31G X 5 MM MISC: 1.0000 | Freq: Every day | 0 refills | Status: AC

## 2021-11-02 NOTE — TOC CM/SW Note (Signed)
  Transition of Care [facility]) Screening Note   Patient Details  Name: Javier Sellers Date of Birth: 08-06-1950   Transition of Care [facility]) CM/SW Contact:    Ross Ludwig, LCSW Phone Number: 11/02/2021, 5:10 PM    Transition of Care Department [facility]) has reviewed patient and no TOC needs have been identified at this time. We will continue to monitor patient advancement through interdisciplinary progression rounds. If new patient transition needs arise, please place a TOC consult.

## 2021-11-02 NOTE — Progress Notes (Addendum)
Inpatient Diabetes Program Recommendations  AACE/ADA: New Consensus Statement on Inpatient Glycemic Control (2015)  Target Ranges:  Prepandial:   less than 140 mg/dL      Peak postprandial:   less than 180 mg/dL (1-2 hours)      Critically ill patients:  140 - 180 mg/dL   Lab Results  Component Value Date   GLUCAP 249 (H) 11/02/2021   HGBA1C 8.9 (H) 10/31/2021    Review of Glycemic Control  Diabetes history: None, New diagnosis this admission  Cancer treatments, with further research Padcev effects 14% of pts develop hyperglycemia and 7% severe of hyperglycemia that can also cause DKA  Current orders for Inpatient glycemic control:  Semglee 15 units Daily Novolog 0-15 units tid + hs  A1c 8.9% on 5/19  Inpatient Diabetes Program Recommendations:    -   Increase Semglee to 20 units  Pt unable to tolerate Padcev outpt, unsure if glucose levels will normalize after treatment. To my knowledge I think the beta cells are still intact after this therapy.  Will see pt this admission. Diabetes Coordinator available by phone/pager over the weekend!  Spoke with pt at bedside to show insulin pen. Discussed briefly how the medication he was on elevated his glucose levels to the point where he will require insulin when he goes home. Pt was drowsy when I was in room. Diabetes Coordinator to follow up tomorrow 5/22 to repeat education.  Thanks,  Tama Headings RN, MSN, BC-ADM Inpatient Diabetes Coordinator Team Pager [phone] (8a-5p)

## 2021-11-02 NOTE — Discharge Summary (Signed)
Physician Discharge Summary   Patient: Javier Sellers MRN: [id number] DOB: 1951-03-20  Admit date:     10/31/2021  Discharge date: 11/02/21  Discharge Physician: Marylu Lund   PCP: [name], MD   Recommendations at discharge:    Follow up with PCP in 1-2 weeks Recommend following glycemic trends closely, titrate up or down insulin as needed Recommend repeat BMET in 1 week, focus on renal function  Discharge Diagnoses: Principal Problem:   DKA (diabetic ketoacidosis) ([place]) Active Problems:   Malignant neoplasm of urinary bladder (H[facility])   NSCLC of left lung (H[facility])   Leukopenia   Prolonged QT interval   AKI (acute kidney injury) ([place])   Acute metabolic encephalopathy   Hypokalemia   Primary hypomagnesemia  Resolved Problems:   * No resolved hospital problems. [facility] Course: 71 y.o. 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    Recently finished his first cycle of salvage therapy Padcev for on 10/17/21 and since then has felt weak, unable to walk, has memory loss, decrease appetite and nausea. Has lower abdominal pain in the last few days.  In the ED,he was afebrile, normotensive on room air. BG greater than 500 with no anion gap but upward trending upwards to 13, with normal pH. Elevated beta-hydroxy at 2.32. AKI creatinine of 2.23 from 1.16.  Leukopenia of 3.6.  Assessment and Plan: * DKA (diabetic ketoacidosis) ([place]) Mild early DKA- pH is normal but with increase gap and elevated beta hydroxybutyrate.  CBG of greater than 500. -No known diagnosis of diabetes.  Likely secondary to effects of his salvage therapy Padcev -transitioned off insulin gtt to subq insulin per above -Recommend close outpatient follow up for insulin titration to either increase or  decrease dosage as needed.   Primary hypomagnesemia -replaced  Hypokalemia -replaced  Acute metabolic encephalopathy -Pt noted increase memory issue since starting salvage therapy with oncology 2 weeks ago.  -CT head noted to be unremarkable -Presented with marked hyperglycemia with GAP of 15 -Gap closed and insulin stabilized with insulin gtt and IVF -Have transitioned to 20u semglee daily with SSI coverage while in hospital -diabetic coordinator following, recommend continued long acting insulin on d/c, goal for close f/u for insulin titration -Unclear if beta cells are remained intact, thus allowing for less dependency on subQ insulin over time  AKI (acute kidney injury) ([place]) -Cr improving -Held ACEI on d/c. May resume down the road once renal function normalizes  Prolonged QT interval QTc of 503 initially with repeat of 458 Avoid QT prolongation meds.  Leukopenia WBC at 3.6. Likely reactive to therapy. -WBC down to 2.5 -remained stable  NSCLC of left lung ([place]) Follows with oncology  Malignant neoplasm of urinary bladder ([place]) -s/p TURBT  -Follows with Dr. Alen Blew with oncology -Previous completed chemotherapy and now on salvage therapy with Padcev. Need discussion with oncology regarding therapy going forward since pt is not tolerating it well.         Consultants: Diabetic Coordinator Procedures performed:   Disposition: Home Diet recommendation:  Carb modified diet DISCHARGE MEDICATION: Allergies as of 11/02/2021   No Known Allergies      Medication List     STOP taking these medications    lidocaine-prilocaine cream Commonly known as: EMLA   lisinopril 40 MG tablet Commonly known  as: ZESTRIL       TAKE these medications    amLODipine 10 MG tablet Commonly known as: NORVASC Take 1 tablet (10 mg total) by mouth daily. What changed:  medication strength how much to take when to take this   blood glucose meter kit and supplies  Kit Dispense based on patient and insurance preference. Use up to four times daily as directed.   diclofenac Sodium 1 % Gel Commonly known as: VOLTAREN Apply 1 application topically daily as needed (knee pain).   gabapentin 300 MG capsule Commonly known as: NEURONTIN Take 300 mg by mouth daily.   Icy Hot Advanced Pain Relief 16-11 % Crea Generic drug: Menthol-Camphor Apply 1 application topically daily as needed (pain).   insulin glargine 100 UNIT/ML Solostar Pen Commonly known as: LANTUS Inject 20 Units into the skin daily.   Insulin Pen Needle 31G X 5 MM Misc 1 Device by Does not apply route daily. For use with insulin pen   levothyroxine 25 MCG tablet Commonly known as: SYNTHROID Take 25 mcg by mouth daily.   metoprolol succinate 100 MG 24 hr tablet Commonly known as: TOPROL-XL Take 100 mg by mouth every morning.   pravastatin 80 MG tablet Commonly known as: PRAVACHOL Take 80 mg by mouth at bedtime.   prochlorperazine 10 MG tablet Commonly known as: COMPAZINE Take 1 tablet (10 mg total) by mouth every 6 (six) hours as needed for nausea or vomiting.   tamsulosin 0.4 MG Caps capsule Commonly known as: FLOMAX Take 0.4 mg by mouth daily.        Discharge Exam: Filed Weights   10/31/21 1740  Weight: 110.7 kg   General exam: Awake, laying in bed, in nad Respiratory system: Normal respiratory effort, no wheezing Cardiovascular system: regular rate, s1, s2 Gastrointestinal system: Soft, nondistended, positive BS Central nervous system: CN2-12 grossly intact, strength intact Extremities: Perfused, no clubbing Skin: Normal skin turgor, no notable skin lesions seen Psychiatry: Mood normal // no visual hallucinations   Condition at discharge: fair  The results of significant diagnostics from this hospitalization (including imaging, microbiology, ancillary and laboratory) are listed below for reference.   Imaging Studies: CT Head Wo Contrast  Result Date:  10/31/2021 CLINICAL DATA:  Altered mental status. EXAM: CT HEAD WITHOUT CONTRAST TECHNIQUE: Contiguous axial images were obtained from the base of the skull through the vertex without intravenous contrast. RADIATION DOSE REDUCTION: This exam was performed according to the departmental dose-optimization program which includes automated exposure control, adjustment of the mA and/or kV according to patient size and/or use of iterative reconstruction technique. COMPARISON:  None Available. FINDINGS: Brain: Mild age-related atrophy and chronic microvascular ischemic changes. There is no acute intracranial hemorrhage. No mass effect or midline shift. No extra-axial fluid collection. Vascular: No hyperdense vessel or unexpected calcification. Skull: Normal. Negative for fracture or focal lesion. Sinuses/Orbits: No acute finding. Other: None IMPRESSION: 1. No acute intracranial pathology. 2. Mild age-related atrophy and chronic microvascular ischemic changes. Electronically Signed   By: Anner Crete M.D.   On: 10/31/2021 19:31   DG Chest Portable 1 View  Result Date: 10/31/2021 CLINICAL DATA:  Altered mental status, hyperglycemia EXAM: PORTABLE CHEST 1 VIEW COMPARISON:  12/20/2020 FINDINGS: Lungs are clear. No pneumothorax or pleural effusion. Right internal jugular chest port tip is seen at the superior cavoatrial junction. Cardiac size within normal limits. Pulmonary vascularity is normal. Surgical clips again seen at the left apex. IMPRESSION: No active disease. Electronically Signed   By: Cassandria Anger  Christa See M.D.   On: 10/31/2021 20:01   VAS [place] LOWER EXTREMITY VENOUS (DVT) (ONLY [facility] & [facility])  Result Date: 11/01/2021  Lower Venous DVT Study Patient Name:  LARANCE RATLEDGE  Date of Exam:   10/31/2021 Medical Rec #: [id number]     Accession #:    [id number] Date of Birth: [birth year]      Patient Gender: M Patient Age:   59 years Exam Location:  [facility] Procedure:      VAS [place] LOWER EXTREMITY VENOUS (DVT) Referring  Phys: JULIE HAVILAND --------------------------------------------------------------------------------  Indications: Pain.  Risk Factors: Cancer. Limitations: Body habitus and poor ultrasound/tissue interface. Comparison Study: No prior studies. Performing Technologist: Oliver Hum RVT  Examination Guidelines: A complete evaluation includes B-mode imaging, spectral Doppler, color Doppler, and power Doppler as needed of all accessible portions of each vessel. Bilateral testing is considered an integral part of a complete examination. Limited examinations for reoccurring indications may be performed as noted. The reflux portion of the exam is performed with the patient in reverse Trendelenburg.  +---------+---------------+---------+-----------+----------+--------------+ RIGHT    CompressibilityPhasicitySpontaneityPropertiesThrombus Aging +---------+---------------+---------+-----------+----------+--------------+ CFV      Full           Yes      Yes                                 +---------+---------------+---------+-----------+----------+--------------+ SFJ      Full                                                        +---------+---------------+---------+-----------+----------+--------------+ FV Prox  Full                                                        +---------+---------------+---------+-----------+----------+--------------+ FV Mid   Full                                                        +---------+---------------+---------+-----------+----------+--------------+ FV DistalFull                                                        +---------+---------------+---------+-----------+----------+--------------+ PFV      Full                                                        +---------+---------------+---------+-----------+----------+--------------+ POP      Full           Yes      Yes                                  +---------+---------------+---------+-----------+----------+--------------+  PTV      Full                                                        +---------+---------------+---------+-----------+----------+--------------+ PERO     Full                                                        +---------+---------------+---------+-----------+----------+--------------+   +---------+---------------+---------+-----------+----------+--------------+ LEFT     CompressibilityPhasicitySpontaneityPropertiesThrombus Aging +---------+---------------+---------+-----------+----------+--------------+ CFV      Full           Yes      Yes                                 +---------+---------------+---------+-----------+----------+--------------+ SFJ      Full                                                        +---------+---------------+---------+-----------+----------+--------------+ FV Prox  Full                                                        +---------+---------------+---------+-----------+----------+--------------+ FV Mid   Full                                                        +---------+---------------+---------+-----------+----------+--------------+ FV DistalFull                                                        +---------+---------------+---------+-----------+----------+--------------+ PFV      Full                                                        +---------+---------------+---------+-----------+----------+--------------+ POP      Full           Yes      Yes                                 +---------+---------------+---------+-----------+----------+--------------+ PTV      Full                                                        +---------+---------------+---------+-----------+----------+--------------+  PERO     Full                                                         +---------+---------------+---------+-----------+----------+--------------+     Summary: RIGHT: - There is no evidence of deep vein thrombosis in the lower extremity. However, portions of this examination were limited- see technologist comments above.  - No cystic structure found in the popliteal fossa.  LEFT: - There is no evidence of deep vein thrombosis in the lower extremity. However, portions of this examination were limited- see technologist comments above.  - No cystic structure found in the popliteal fossa.  *See table(s) above for measurements and observations. Electronically signed by Jamelle Haring on 11/01/2021 at 11:44:42 AM.    Final     Microbiology: Results for orders placed or performed during the hospital encounter of 10/31/21  MRSA Next Gen by PCR, Nasal     Status: None   Collection Time: 11/01/21 11:24 AM   Specimen: Nasal Mucosa; Nasal Swab  Result Value Ref Range Status   MRSA by PCR Next Gen NOT DETECTED NOT DETECTED Final    Comment: (NOTE) The GeneXpert MRSA Assay (FDA approved for NASAL specimens only), is one component of a comprehensive MRSA colonization surveillance program. It is not intended to diagnose MRSA infection nor to guide or monitor treatment for MRSA infections. Test performance is not FDA approved in patients less than 59 years old. Performed at [facility], [place] [address], [place], [place] [address]     Labs: CBC: Recent Labs  Lab 10/31/21 1305 10/31/21 1746 11/01/21 0828 11/02/21 0556  WBC 4.1 3.6* 2.5* 2.4*  NEUTROABS 2.0 1.7  --   --   HGB 12.8* 12.2* 10.1* 10.2*  HCT 36.2* 34.6* 28.3* 30.4*  MCV 91.6 93.0 91.9 95.0  PLT 168 155 101* 828*   Basic Metabolic Panel: Recent Labs  Lab 11/01/21 0011 11/01/21 0432 11/01/21 0828 11/01/21 1218 11/02/21 0556  NA 133* 137 138 137 138  K 2.9* 2.7* 2.7* 3.2* 3.7  CL 102 106 106 106 107  CO2 _0 GLUCOSE 395* 214* 186* 181* 226*  BUN 27* 26* 25* 22 18   CREATININE 2.00* 1.95* 1.70* 1.52* 1.46*  CALCIUM 8.3* 8.6* 8.5* 8.6* 8.5*  MG  --   --  1.6*  --  2.0   Liver Function Tests: Recent Labs  Lab 10/31/21 1305 10/31/21 1746 11/02/21 0556  AST 34 30 75*  ALT 47* 45* 51*  ALKPHOS 93 86 98  BILITOT 1.5* 1.6* 1.1  PROT 6.8 6.6 5.8*  ALBUMIN 3.2* 3.1* 2.9*   CBG: Recent Labs  Lab 11/01/21 2131 11/02/21 0747 11/02/21 1201 11/02/21 1400 11/02/21 1654  GLUCAP 174* 249* 245* 233* 220*    Discharge time spent: less than 30 minutes.  Signed: Marylu Lund, MD [facility] 11/02/2021

## 2021-11-02 NOTE — Progress Notes (Signed)
Discharge packet & information given to patient. Pt denies any questions. Vitals stable. IV team order in to deacess port.

## 2021-11-03 LAB — GLUCOSE, CAPILLARY: Glucose-Capillary: 576 mg/dL (ref 70–99)

## 2021-11-04 NOTE — Progress Notes (Signed)
Decrease Padcev to 75mg  (0.75 mg/kg, Cap weight at 100 kg).  Cancel Cycle 2, day 1 and adjust treatment plan dates accordingly per MD.  Acquanetta Belling, RPH, BCPS, BCOP 11/04/2021 10:40 AM

## 2021-11-06 DIAGNOSIS — Z6833 Body mass index (BMI) 33.0-33.9, adult: Secondary | ICD-10-CM | POA: Diagnosis not present

## 2021-11-06 DIAGNOSIS — E1165 Type 2 diabetes mellitus with hyperglycemia: Secondary | ICD-10-CM | POA: Diagnosis not present

## 2021-11-06 DIAGNOSIS — E039 Hypothyroidism, unspecified: Secondary | ICD-10-CM | POA: Diagnosis not present

## 2021-11-06 DIAGNOSIS — Z794 Long term (current) use of insulin: Secondary | ICD-10-CM | POA: Diagnosis not present

## 2021-11-06 MED FILL — Dexamethasone Sodium Phosphate Inj 100 MG/10ML: INTRAMUSCULAR | Qty: 1 | Status: AC

## 2021-11-07 ENCOUNTER — Inpatient Hospital Stay: Payer: Medicare Other

## 2021-11-07 ENCOUNTER — Inpatient Hospital Stay (HOSPITAL_BASED_OUTPATIENT_CLINIC_OR_DEPARTMENT_OTHER): Payer: Medicare Other | Admitting: Oncology

## 2021-11-07 ENCOUNTER — Other Ambulatory Visit: Payer: Self-pay

## 2021-11-07 VITALS — BP 107/64 | HR 82 | Temp 98.2°F | Resp 18 | Ht 72.0 in | Wt 243.6 lb

## 2021-11-07 DIAGNOSIS — C787 Secondary malignant neoplasm of liver and intrahepatic bile duct: Secondary | ICD-10-CM | POA: Diagnosis not present

## 2021-11-07 DIAGNOSIS — C679 Malignant neoplasm of bladder, unspecified: Secondary | ICD-10-CM | POA: Diagnosis not present

## 2021-11-07 DIAGNOSIS — Z95828 Presence of other vascular implants and grafts: Secondary | ICD-10-CM

## 2021-11-07 DIAGNOSIS — Z5112 Encounter for antineoplastic immunotherapy: Secondary | ICD-10-CM | POA: Diagnosis not present

## 2021-11-07 LAB — CBC WITH DIFFERENTIAL (CANCER CENTER ONLY)
Abs Immature Granulocytes: 0.11 10*3/uL — ABNORMAL HIGH (ref 0.00–0.07)
Basophils Absolute: 0.1 10*3/uL (ref 0.0–0.1)
Basophils Relative: 1 %
Eosinophils Absolute: 0.1 10*3/uL (ref 0.0–0.5)
Eosinophils Relative: 1 %
HCT: 34.1 % — ABNORMAL LOW (ref 39.0–52.0)
Hemoglobin: 11.7 g/dL — ABNORMAL LOW (ref 13.0–17.0)
Immature Granulocytes: 2 %
Lymphocytes Relative: 14 %
Lymphs Abs: 0.7 10*3/uL (ref 0.7–4.0)
MCH: 31.8 pg (ref 26.0–34.0)
MCHC: 34.3 g/dL (ref 30.0–36.0)
MCV: 92.7 fL (ref 80.0–100.0)
Monocytes Absolute: 0.5 10*3/uL (ref 0.1–1.0)
Monocytes Relative: 10 %
Neutro Abs: 3.7 10*3/uL (ref 1.7–7.7)
Neutrophils Relative %: 72 %
Platelet Count: 109 10*3/uL — ABNORMAL LOW (ref 150–400)
RBC: 3.68 MIL/uL — ABNORMAL LOW (ref 4.22–5.81)
RDW: 17.3 % — ABNORMAL HIGH (ref 11.5–15.5)
WBC Count: 5.1 10*3/uL (ref 4.0–10.5)
nRBC: 0.4 % — ABNORMAL HIGH (ref 0.0–0.2)

## 2021-11-07 LAB — CMP (CANCER CENTER ONLY)
ALT: 53 U/L — ABNORMAL HIGH (ref 0–44)
AST: 113 U/L — ABNORMAL HIGH (ref 15–41)
Albumin: 3.3 g/dL — ABNORMAL LOW (ref 3.5–5.0)
Alkaline Phosphatase: 194 U/L — ABNORMAL HIGH (ref 38–126)
Anion gap: 9 (ref 5–15)
BUN: 10 mg/dL (ref 8–23)
CO2: 26 mmol/L (ref 22–32)
Calcium: 8.6 mg/dL — ABNORMAL LOW (ref 8.9–10.3)
Chloride: 98 mmol/L (ref 98–111)
Creatinine: 1.1 mg/dL (ref 0.61–1.24)
GFR, Estimated: >60 mL/min (ref >60)
Glucose, Bld: 162 mg/dL — ABNORMAL HIGH (ref 70–99)
Potassium: 3.5 mmol/L (ref 3.5–5.1)
Sodium: 133 mmol/L — ABNORMAL LOW (ref 135–145)
Total Bilirubin: 1.1 mg/dL (ref 0.3–1.2)
Total Protein: 6.6 g/dL (ref 6.5–8.1)

## 2021-11-07 MED ORDER — SODIUM CHLORIDE 0.9% FLUSH
10.0000 mL | Freq: Once | INTRAVENOUS | Status: AC
  Administered 2021-11-07: 10 mL

## 2021-11-07 NOTE — Progress Notes (Signed)
Hematology and Oncology Follow Up Visit  Javier Sellers [id number] 11-16-1950 71 y.o. 11/07/2021 12:38 PM Javier Sellers, MDRedding, Javier Blonder, MD   Principle Diagnosis: 71 year old man with bladder diagnosed in June 2022. He presented with IV high-grade urothelial carcinoma of the bladder with hepatic metastasis   Secondary diagnosis: Left lung neoplasm consistent with non-small cell lung cancer diagnosed in July 2022.  Prior Therapy: He underwent TURBT on November 13, 2020 and the final pathology revealed high-grade urothelial carcinoma with small cell component of 70%.  Chemotherapy utilizing gemcitabine and cisplatin started on December 27, 2020.  He completed 6 cycles of therapy and October 2022.  Nivolumab 480 mg monthly started on May 02, 2021.  Returns today for repeat infusion.  Current therapy: Padcev 100 mg weekly 3 weeks out of a 28-day cycle.  He completed cycle 1 of therapy on Oct 17, 2021.  Interim History: Javier Sellers presents today for a follow-up visit.  Since the last visit, he developed symptoms of hyperglycemia and DKA that required hospitalization on Oct 31, 2021.  Since his discharge, he continues to decline with symptoms of failure to thrive, weakness and weight loss.  His appetite has been poor and his performance status is declining.    Medications: Updated on review. Current Outpatient Medications  Medication Sig Dispense Refill   amLODipine (NORVASC) 10 MG tablet Take 1 tablet (10 mg total) by mouth daily. 30 tablet 0   blood glucose meter kit and supplies KIT Dispense based on patient and insurance preference. Use up to four times daily as directed. 1 each 0   diclofenac Sodium (VOLTAREN) 1 % GEL Apply 1 application topically daily as needed (knee pain).     gabapentin (NEURONTIN) 300 MG capsule Take 300 mg by mouth daily.     insulin glargine (LANTUS) 100 UNIT/ML Solostar Pen Inject 20 Units into the skin daily. 15 mL 0   Insulin Pen Needle 31G X 5 MM MISC 1  Device by Does not apply route daily. For use with insulin pen 100 each 0   levothyroxine (SYNTHROID) 25 MCG tablet Take 25 mcg by mouth daily.     Menthol-Camphor (ICY HOT ADVANCED PAIN RELIEF) 16-11 % CREA Apply 1 application topically daily as needed (pain).     metoprolol succinate (TOPROL-XL) 100 MG 24 hr tablet Take 100 mg by mouth every morning.     pravastatin (PRAVACHOL) 80 MG tablet Take 80 mg by mouth at bedtime.     prochlorperazine (COMPAZINE) 10 MG tablet Take 1 tablet (10 mg total) by mouth every 6 (six) hours as needed for nausea or vomiting. 30 tablet 0   tamsulosin (FLOMAX) 0.4 MG CAPS capsule Take 0.4 mg by mouth daily.     No current facility-administered medications for this visit.     Allergies: No Known Allergies   Physical Exam:        Blood pressure 107/64, pulse 82, temperature 98.2 F (36.8 C), temperature source Temporal, resp. rate 18, height 6' (1.829 m), weight 243 lb 9.6 oz (110.5 kg), SpO2 98 %.      ECOG: 1    General appearance: Alert, awake without any distress. Head: Atraumatic without abnormalities Oropharynx: Without any thrush or ulcers. Eyes: No scleral icterus. Lymph nodes: No lymphadenopathy noted in the cervical, supraclavicular, or axillary nodes Heart:regular rate and rhythm, without any murmurs or gallops.   Lung: Clear to auscultation without any rhonchi, wheezes or dullness to percussion. Abdomin: Soft, nontender without any shifting  dullness or ascites. Musculoskeletal: No clubbing or cyanosis. Neurological: No motor or sensory deficits. Skin: No rashes or lesions.           Lab Results: Lab Results  Component Value Date   WBC 5.1 11/07/2021   HGB 11.7 (L) 11/07/2021   HCT 34.1 (L) 11/07/2021   MCV 92.7 11/07/2021   PLT 109 (L) 11/07/2021     Chemistry      Component Value Date/Time   NA 133 (L) 11/07/2021 1110   K 3.5 11/07/2021 1110   CL 98 11/07/2021 1110   CO2 26 11/07/2021 1110   BUN 10  11/07/2021 1110   CREATININE 1.10 11/07/2021 1110      Component Value Date/Time   CALCIUM 8.6 (L) 11/07/2021 1110   ALKPHOS 194 (H) 11/07/2021 1110   AST 113 (H) 11/07/2021 1110   ALT 53 (H) 11/07/2021 1110   BILITOT 1.1 11/07/2021 1110         Impression and Plan:  71 year old man with:  1.  Stage IV high-grade urothelial carcinoma with hepatic involvement.  The natural course of this disease was reviewed at this time and treatment choices were discussed.  He is experiencing rapid decline in his overall performance status and severe metabolic derangements indicative of disease progression.  This is likely related to his disease progression and unlikely to be reversed by chemotherapy.  At this time I recommended discontinuation of treatment and hospice enrollment.  He is in agreement about therapy discontinuation and will consider hospice in the near future.      2.  IV access: Port-A-Cath currently in place without any issues.   3.  Hyperglycemia: Improved at this time and blood sugar mildly elevated.   4.  Goals of care: His disease is incurable and rapidly progressing.  Any treatment is palliative unlikely to reverse his course.  He has limited life expectancy of less than 6 months.   5.  Follow-up: He will return in the next 2 weeks for repeat evaluation.  This could be canceled if he opted to proceed with hospice.  30  minutes were spent on this encounter.  The time was dedicated to reviewing laboratory data, disease status update and future plan of care discussion including prognosis and goals of care.   Javier Button, MD 5/26/202312:38 PM

## 2021-11-14 ENCOUNTER — Other Ambulatory Visit: Payer: Medicare Other

## 2021-11-14 ENCOUNTER — Ambulatory Visit: Payer: Medicare Other

## 2021-11-27 DIAGNOSIS — Z6830 Body mass index (BMI) 30.0-30.9, adult: Secondary | ICD-10-CM | POA: Diagnosis not present

## 2021-11-27 DIAGNOSIS — Z8551 Personal history of malignant neoplasm of bladder: Secondary | ICD-10-CM | POA: Diagnosis not present

## 2021-11-27 DIAGNOSIS — F4320 Adjustment disorder, unspecified: Secondary | ICD-10-CM | POA: Diagnosis not present

## 2021-11-27 DIAGNOSIS — R5383 Other fatigue: Secondary | ICD-10-CM | POA: Diagnosis not present

## 2021-11-27 MED FILL — Dexamethasone Sodium Phosphate Inj 100 MG/10ML: INTRAMUSCULAR | Qty: 1 | Status: AC

## 2021-11-28 ENCOUNTER — Inpatient Hospital Stay: Payer: Medicare Other

## 2021-11-28 ENCOUNTER — Inpatient Hospital Stay: Payer: Medicare Other | Admitting: Oncology

## 2021-11-28 ENCOUNTER — Telehealth: Payer: Self-pay | Admitting: *Deleted

## 2021-11-28 ENCOUNTER — Inpatient Hospital Stay: Payer: Medicare Other | Admitting: Dietician

## 2021-11-28 NOTE — Telephone Encounter (Signed)
Called patient regarding appts today. States Dr Lin Landsman enrolled him with Hospice yesterday.

## 2021-12-05 ENCOUNTER — Ambulatory Visit: Payer: Medicare Other

## 2021-12-05 ENCOUNTER — Other Ambulatory Visit: Payer: Medicare Other

## 2021-12-08 DIAGNOSIS — N400 Enlarged prostate without lower urinary tract symptoms: Secondary | ICD-10-CM | POA: Diagnosis not present

## 2021-12-08 DIAGNOSIS — Z8639 Personal history of other endocrine, nutritional and metabolic disease: Secondary | ICD-10-CM | POA: Diagnosis not present

## 2021-12-08 DIAGNOSIS — K760 Fatty (change of) liver, not elsewhere classified: Secondary | ICD-10-CM | POA: Diagnosis not present

## 2021-12-08 DIAGNOSIS — I10 Essential (primary) hypertension: Secondary | ICD-10-CM | POA: Diagnosis not present

## 2021-12-08 DIAGNOSIS — F419 Anxiety disorder, unspecified: Secondary | ICD-10-CM | POA: Diagnosis not present

## 2021-12-08 DIAGNOSIS — Z87448 Personal history of other diseases of urinary system: Secondary | ICD-10-CM | POA: Diagnosis not present

## 2021-12-08 DIAGNOSIS — R634 Abnormal weight loss: Secondary | ICD-10-CM | POA: Diagnosis not present

## 2021-12-08 DIAGNOSIS — D510 Vitamin B12 deficiency anemia due to intrinsic factor deficiency: Secondary | ICD-10-CM | POA: Diagnosis not present

## 2021-12-08 DIAGNOSIS — G629 Polyneuropathy, unspecified: Secondary | ICD-10-CM | POA: Diagnosis not present

## 2021-12-08 DIAGNOSIS — C3492 Malignant neoplasm of unspecified part of left bronchus or lung: Secondary | ICD-10-CM | POA: Diagnosis not present

## 2021-12-08 DIAGNOSIS — M1990 Unspecified osteoarthritis, unspecified site: Secondary | ICD-10-CM | POA: Diagnosis not present

## 2021-12-08 DIAGNOSIS — C787 Secondary malignant neoplasm of liver and intrahepatic bile duct: Secondary | ICD-10-CM | POA: Diagnosis not present

## 2021-12-08 DIAGNOSIS — F32A Depression, unspecified: Secondary | ICD-10-CM | POA: Diagnosis not present

## 2021-12-08 DIAGNOSIS — Z87891 Personal history of nicotine dependence: Secondary | ICD-10-CM | POA: Diagnosis not present

## 2021-12-08 DIAGNOSIS — E039 Hypothyroidism, unspecified: Secondary | ICD-10-CM | POA: Diagnosis not present

## 2021-12-08 DIAGNOSIS — C679 Malignant neoplasm of bladder, unspecified: Secondary | ICD-10-CM | POA: Diagnosis not present

## 2021-12-08 DIAGNOSIS — E440 Moderate protein-calorie malnutrition: Secondary | ICD-10-CM | POA: Diagnosis not present

## 2021-12-09 DIAGNOSIS — Z87891 Personal history of nicotine dependence: Secondary | ICD-10-CM | POA: Diagnosis not present

## 2021-12-09 DIAGNOSIS — C3492 Malignant neoplasm of unspecified part of left bronchus or lung: Secondary | ICD-10-CM | POA: Diagnosis not present

## 2021-12-09 DIAGNOSIS — C787 Secondary malignant neoplasm of liver and intrahepatic bile duct: Secondary | ICD-10-CM | POA: Diagnosis not present

## 2021-12-09 DIAGNOSIS — E440 Moderate protein-calorie malnutrition: Secondary | ICD-10-CM | POA: Diagnosis not present

## 2021-12-09 DIAGNOSIS — C679 Malignant neoplasm of bladder, unspecified: Secondary | ICD-10-CM | POA: Diagnosis not present

## 2021-12-09 DIAGNOSIS — Z8639 Personal history of other endocrine, nutritional and metabolic disease: Secondary | ICD-10-CM | POA: Diagnosis not present

## 2021-12-10 DIAGNOSIS — Z87891 Personal history of nicotine dependence: Secondary | ICD-10-CM | POA: Diagnosis not present

## 2021-12-10 DIAGNOSIS — C679 Malignant neoplasm of bladder, unspecified: Secondary | ICD-10-CM | POA: Diagnosis not present

## 2021-12-10 DIAGNOSIS — C787 Secondary malignant neoplasm of liver and intrahepatic bile duct: Secondary | ICD-10-CM | POA: Diagnosis not present

## 2021-12-10 DIAGNOSIS — Z8639 Personal history of other endocrine, nutritional and metabolic disease: Secondary | ICD-10-CM | POA: Diagnosis not present

## 2021-12-10 DIAGNOSIS — E440 Moderate protein-calorie malnutrition: Secondary | ICD-10-CM | POA: Diagnosis not present

## 2021-12-10 DIAGNOSIS — C3492 Malignant neoplasm of unspecified part of left bronchus or lung: Secondary | ICD-10-CM | POA: Diagnosis not present

## 2021-12-12 ENCOUNTER — Ambulatory Visit: Payer: Medicare Other

## 2021-12-12 ENCOUNTER — Other Ambulatory Visit: Payer: Medicare Other

## 2021-12-12 DIAGNOSIS — I10 Essential (primary) hypertension: Secondary | ICD-10-CM | POA: Diagnosis not present

## 2021-12-12 DIAGNOSIS — E039 Hypothyroidism, unspecified: Secondary | ICD-10-CM | POA: Diagnosis not present

## 2021-12-12 DIAGNOSIS — E7800 Pure hypercholesterolemia, unspecified: Secondary | ICD-10-CM | POA: Diagnosis not present

## 2021-12-13 DIAGNOSIS — Z87891 Personal history of nicotine dependence: Secondary | ICD-10-CM | POA: Diagnosis not present

## 2021-12-13 DIAGNOSIS — R634 Abnormal weight loss: Secondary | ICD-10-CM | POA: Diagnosis not present

## 2021-12-13 DIAGNOSIS — E039 Hypothyroidism, unspecified: Secondary | ICD-10-CM | POA: Diagnosis not present

## 2021-12-13 DIAGNOSIS — G629 Polyneuropathy, unspecified: Secondary | ICD-10-CM | POA: Diagnosis not present

## 2021-12-13 DIAGNOSIS — C787 Secondary malignant neoplasm of liver and intrahepatic bile duct: Secondary | ICD-10-CM | POA: Diagnosis not present

## 2021-12-13 DIAGNOSIS — M1990 Unspecified osteoarthritis, unspecified site: Secondary | ICD-10-CM | POA: Diagnosis not present

## 2021-12-13 DIAGNOSIS — Z8639 Personal history of other endocrine, nutritional and metabolic disease: Secondary | ICD-10-CM | POA: Diagnosis not present

## 2021-12-13 DIAGNOSIS — N400 Enlarged prostate without lower urinary tract symptoms: Secondary | ICD-10-CM | POA: Diagnosis not present

## 2021-12-13 DIAGNOSIS — C3492 Malignant neoplasm of unspecified part of left bronchus or lung: Secondary | ICD-10-CM | POA: Diagnosis not present

## 2021-12-13 DIAGNOSIS — F419 Anxiety disorder, unspecified: Secondary | ICD-10-CM | POA: Diagnosis not present

## 2021-12-13 DIAGNOSIS — F32A Depression, unspecified: Secondary | ICD-10-CM | POA: Diagnosis not present

## 2021-12-13 DIAGNOSIS — C679 Malignant neoplasm of bladder, unspecified: Secondary | ICD-10-CM | POA: Diagnosis not present

## 2021-12-13 DIAGNOSIS — Z87448 Personal history of other diseases of urinary system: Secondary | ICD-10-CM | POA: Diagnosis not present

## 2021-12-13 DIAGNOSIS — I10 Essential (primary) hypertension: Secondary | ICD-10-CM | POA: Diagnosis not present

## 2021-12-13 DIAGNOSIS — D510 Vitamin B12 deficiency anemia due to intrinsic factor deficiency: Secondary | ICD-10-CM | POA: Diagnosis not present

## 2021-12-13 DIAGNOSIS — E440 Moderate protein-calorie malnutrition: Secondary | ICD-10-CM | POA: Diagnosis not present

## 2021-12-13 DIAGNOSIS — K760 Fatty (change of) liver, not elsewhere classified: Secondary | ICD-10-CM | POA: Diagnosis not present

## 2021-12-19 DIAGNOSIS — Z87891 Personal history of nicotine dependence: Secondary | ICD-10-CM | POA: Diagnosis not present

## 2021-12-19 DIAGNOSIS — C3492 Malignant neoplasm of unspecified part of left bronchus or lung: Secondary | ICD-10-CM | POA: Diagnosis not present

## 2021-12-19 DIAGNOSIS — C787 Secondary malignant neoplasm of liver and intrahepatic bile duct: Secondary | ICD-10-CM | POA: Diagnosis not present

## 2021-12-19 DIAGNOSIS — C679 Malignant neoplasm of bladder, unspecified: Secondary | ICD-10-CM | POA: Diagnosis not present

## 2021-12-19 DIAGNOSIS — Z8639 Personal history of other endocrine, nutritional and metabolic disease: Secondary | ICD-10-CM | POA: Diagnosis not present

## 2021-12-19 DIAGNOSIS — E440 Moderate protein-calorie malnutrition: Secondary | ICD-10-CM | POA: Diagnosis not present

## 2021-12-22 DIAGNOSIS — C679 Malignant neoplasm of bladder, unspecified: Secondary | ICD-10-CM | POA: Diagnosis not present

## 2021-12-22 DIAGNOSIS — C3492 Malignant neoplasm of unspecified part of left bronchus or lung: Secondary | ICD-10-CM | POA: Diagnosis not present

## 2021-12-22 DIAGNOSIS — E440 Moderate protein-calorie malnutrition: Secondary | ICD-10-CM | POA: Diagnosis not present

## 2021-12-22 DIAGNOSIS — C787 Secondary malignant neoplasm of liver and intrahepatic bile duct: Secondary | ICD-10-CM | POA: Diagnosis not present

## 2021-12-22 DIAGNOSIS — Z87891 Personal history of nicotine dependence: Secondary | ICD-10-CM | POA: Diagnosis not present

## 2021-12-22 DIAGNOSIS — Z8639 Personal history of other endocrine, nutritional and metabolic disease: Secondary | ICD-10-CM | POA: Diagnosis not present

## 2021-12-24 DIAGNOSIS — Z87891 Personal history of nicotine dependence: Secondary | ICD-10-CM | POA: Diagnosis not present

## 2021-12-24 DIAGNOSIS — C3492 Malignant neoplasm of unspecified part of left bronchus or lung: Secondary | ICD-10-CM | POA: Diagnosis not present

## 2021-12-24 DIAGNOSIS — C679 Malignant neoplasm of bladder, unspecified: Secondary | ICD-10-CM | POA: Diagnosis not present

## 2021-12-24 DIAGNOSIS — E440 Moderate protein-calorie malnutrition: Secondary | ICD-10-CM | POA: Diagnosis not present

## 2021-12-24 DIAGNOSIS — Z8639 Personal history of other endocrine, nutritional and metabolic disease: Secondary | ICD-10-CM | POA: Diagnosis not present

## 2021-12-24 DIAGNOSIS — C787 Secondary malignant neoplasm of liver and intrahepatic bile duct: Secondary | ICD-10-CM | POA: Diagnosis not present

## 2021-12-26 DIAGNOSIS — C3492 Malignant neoplasm of unspecified part of left bronchus or lung: Secondary | ICD-10-CM | POA: Diagnosis not present

## 2021-12-26 DIAGNOSIS — C679 Malignant neoplasm of bladder, unspecified: Secondary | ICD-10-CM | POA: Diagnosis not present

## 2021-12-26 DIAGNOSIS — E440 Moderate protein-calorie malnutrition: Secondary | ICD-10-CM | POA: Diagnosis not present

## 2021-12-26 DIAGNOSIS — C787 Secondary malignant neoplasm of liver and intrahepatic bile duct: Secondary | ICD-10-CM | POA: Diagnosis not present

## 2021-12-26 DIAGNOSIS — Z87891 Personal history of nicotine dependence: Secondary | ICD-10-CM | POA: Diagnosis not present

## 2021-12-26 DIAGNOSIS — Z8639 Personal history of other endocrine, nutritional and metabolic disease: Secondary | ICD-10-CM | POA: Diagnosis not present

## 2021-12-30 DIAGNOSIS — C3492 Malignant neoplasm of unspecified part of left bronchus or lung: Secondary | ICD-10-CM | POA: Diagnosis not present

## 2021-12-30 DIAGNOSIS — C679 Malignant neoplasm of bladder, unspecified: Secondary | ICD-10-CM | POA: Diagnosis not present

## 2021-12-30 DIAGNOSIS — Z87891 Personal history of nicotine dependence: Secondary | ICD-10-CM | POA: Diagnosis not present

## 2021-12-30 DIAGNOSIS — C787 Secondary malignant neoplasm of liver and intrahepatic bile duct: Secondary | ICD-10-CM | POA: Diagnosis not present

## 2021-12-30 DIAGNOSIS — Z8639 Personal history of other endocrine, nutritional and metabolic disease: Secondary | ICD-10-CM | POA: Diagnosis not present

## 2021-12-30 DIAGNOSIS — E440 Moderate protein-calorie malnutrition: Secondary | ICD-10-CM | POA: Diagnosis not present

## 2022-01-05 ENCOUNTER — Other Ambulatory Visit: Payer: Self-pay

## 2022-01-07 DIAGNOSIS — E440 Moderate protein-calorie malnutrition: Secondary | ICD-10-CM | POA: Diagnosis not present

## 2022-01-07 DIAGNOSIS — C679 Malignant neoplasm of bladder, unspecified: Secondary | ICD-10-CM | POA: Diagnosis not present

## 2022-01-07 DIAGNOSIS — Z87891 Personal history of nicotine dependence: Secondary | ICD-10-CM | POA: Diagnosis not present

## 2022-01-07 DIAGNOSIS — C787 Secondary malignant neoplasm of liver and intrahepatic bile duct: Secondary | ICD-10-CM | POA: Diagnosis not present

## 2022-01-07 DIAGNOSIS — C3492 Malignant neoplasm of unspecified part of left bronchus or lung: Secondary | ICD-10-CM | POA: Diagnosis not present

## 2022-01-07 DIAGNOSIS — Z8639 Personal history of other endocrine, nutritional and metabolic disease: Secondary | ICD-10-CM | POA: Diagnosis not present

## 2022-01-09 DIAGNOSIS — C3492 Malignant neoplasm of unspecified part of left bronchus or lung: Secondary | ICD-10-CM | POA: Diagnosis not present

## 2022-01-09 DIAGNOSIS — Z8639 Personal history of other endocrine, nutritional and metabolic disease: Secondary | ICD-10-CM | POA: Diagnosis not present

## 2022-01-09 DIAGNOSIS — Z87891 Personal history of nicotine dependence: Secondary | ICD-10-CM | POA: Diagnosis not present

## 2022-01-09 DIAGNOSIS — E440 Moderate protein-calorie malnutrition: Secondary | ICD-10-CM | POA: Diagnosis not present

## 2022-01-09 DIAGNOSIS — C679 Malignant neoplasm of bladder, unspecified: Secondary | ICD-10-CM | POA: Diagnosis not present

## 2022-01-09 DIAGNOSIS — C787 Secondary malignant neoplasm of liver and intrahepatic bile duct: Secondary | ICD-10-CM | POA: Diagnosis not present

## 2022-01-13 DIAGNOSIS — G629 Polyneuropathy, unspecified: Secondary | ICD-10-CM | POA: Diagnosis not present

## 2022-01-13 DIAGNOSIS — M1990 Unspecified osteoarthritis, unspecified site: Secondary | ICD-10-CM | POA: Diagnosis not present

## 2022-01-13 DIAGNOSIS — F419 Anxiety disorder, unspecified: Secondary | ICD-10-CM | POA: Diagnosis not present

## 2022-01-13 DIAGNOSIS — E039 Hypothyroidism, unspecified: Secondary | ICD-10-CM | POA: Diagnosis not present

## 2022-01-13 DIAGNOSIS — Z87448 Personal history of other diseases of urinary system: Secondary | ICD-10-CM | POA: Diagnosis not present

## 2022-01-13 DIAGNOSIS — C787 Secondary malignant neoplasm of liver and intrahepatic bile duct: Secondary | ICD-10-CM | POA: Diagnosis not present

## 2022-01-13 DIAGNOSIS — E440 Moderate protein-calorie malnutrition: Secondary | ICD-10-CM | POA: Diagnosis not present

## 2022-01-13 DIAGNOSIS — K760 Fatty (change of) liver, not elsewhere classified: Secondary | ICD-10-CM | POA: Diagnosis not present

## 2022-01-13 DIAGNOSIS — N400 Enlarged prostate without lower urinary tract symptoms: Secondary | ICD-10-CM | POA: Diagnosis not present

## 2022-01-13 DIAGNOSIS — R634 Abnormal weight loss: Secondary | ICD-10-CM | POA: Diagnosis not present

## 2022-01-13 DIAGNOSIS — F32A Depression, unspecified: Secondary | ICD-10-CM | POA: Diagnosis not present

## 2022-01-13 DIAGNOSIS — I10 Essential (primary) hypertension: Secondary | ICD-10-CM | POA: Diagnosis not present

## 2022-01-13 DIAGNOSIS — C3492 Malignant neoplasm of unspecified part of left bronchus or lung: Secondary | ICD-10-CM | POA: Diagnosis not present

## 2022-01-13 DIAGNOSIS — Z8639 Personal history of other endocrine, nutritional and metabolic disease: Secondary | ICD-10-CM | POA: Diagnosis not present

## 2022-01-13 DIAGNOSIS — C679 Malignant neoplasm of bladder, unspecified: Secondary | ICD-10-CM | POA: Diagnosis not present

## 2022-01-13 DIAGNOSIS — Z87891 Personal history of nicotine dependence: Secondary | ICD-10-CM | POA: Diagnosis not present

## 2022-01-13 DIAGNOSIS — D510 Vitamin B12 deficiency anemia due to intrinsic factor deficiency: Secondary | ICD-10-CM | POA: Diagnosis not present

## 2022-01-21 DIAGNOSIS — C787 Secondary malignant neoplasm of liver and intrahepatic bile duct: Secondary | ICD-10-CM | POA: Diagnosis not present

## 2022-01-21 DIAGNOSIS — C679 Malignant neoplasm of bladder, unspecified: Secondary | ICD-10-CM | POA: Diagnosis not present

## 2022-01-21 DIAGNOSIS — E440 Moderate protein-calorie malnutrition: Secondary | ICD-10-CM | POA: Diagnosis not present

## 2022-01-21 DIAGNOSIS — C3492 Malignant neoplasm of unspecified part of left bronchus or lung: Secondary | ICD-10-CM | POA: Diagnosis not present

## 2022-01-21 DIAGNOSIS — Z87891 Personal history of nicotine dependence: Secondary | ICD-10-CM | POA: Diagnosis not present

## 2022-01-21 DIAGNOSIS — Z8639 Personal history of other endocrine, nutritional and metabolic disease: Secondary | ICD-10-CM | POA: Diagnosis not present

## 2022-01-29 DIAGNOSIS — C787 Secondary malignant neoplasm of liver and intrahepatic bile duct: Secondary | ICD-10-CM | POA: Diagnosis not present

## 2022-01-29 DIAGNOSIS — C679 Malignant neoplasm of bladder, unspecified: Secondary | ICD-10-CM | POA: Diagnosis not present

## 2022-01-29 DIAGNOSIS — Z87891 Personal history of nicotine dependence: Secondary | ICD-10-CM | POA: Diagnosis not present

## 2022-01-29 DIAGNOSIS — E440 Moderate protein-calorie malnutrition: Secondary | ICD-10-CM | POA: Diagnosis not present

## 2022-01-29 DIAGNOSIS — C3492 Malignant neoplasm of unspecified part of left bronchus or lung: Secondary | ICD-10-CM | POA: Diagnosis not present

## 2022-01-29 DIAGNOSIS — Z8639 Personal history of other endocrine, nutritional and metabolic disease: Secondary | ICD-10-CM | POA: Diagnosis not present

## 2022-02-06 DIAGNOSIS — Z87891 Personal history of nicotine dependence: Secondary | ICD-10-CM | POA: Diagnosis not present

## 2022-02-06 DIAGNOSIS — Z8639 Personal history of other endocrine, nutritional and metabolic disease: Secondary | ICD-10-CM | POA: Diagnosis not present

## 2022-02-06 DIAGNOSIS — C679 Malignant neoplasm of bladder, unspecified: Secondary | ICD-10-CM | POA: Diagnosis not present

## 2022-02-06 DIAGNOSIS — E440 Moderate protein-calorie malnutrition: Secondary | ICD-10-CM | POA: Diagnosis not present

## 2022-02-06 DIAGNOSIS — C3492 Malignant neoplasm of unspecified part of left bronchus or lung: Secondary | ICD-10-CM | POA: Diagnosis not present

## 2022-02-06 DIAGNOSIS — C787 Secondary malignant neoplasm of liver and intrahepatic bile duct: Secondary | ICD-10-CM | POA: Diagnosis not present

## 2022-02-12 DIAGNOSIS — Z87891 Personal history of nicotine dependence: Secondary | ICD-10-CM | POA: Diagnosis not present

## 2022-02-12 DIAGNOSIS — C787 Secondary malignant neoplasm of liver and intrahepatic bile duct: Secondary | ICD-10-CM | POA: Diagnosis not present

## 2022-02-12 DIAGNOSIS — C3492 Malignant neoplasm of unspecified part of left bronchus or lung: Secondary | ICD-10-CM | POA: Diagnosis not present

## 2022-02-12 DIAGNOSIS — Z8639 Personal history of other endocrine, nutritional and metabolic disease: Secondary | ICD-10-CM | POA: Diagnosis not present

## 2022-02-12 DIAGNOSIS — E440 Moderate protein-calorie malnutrition: Secondary | ICD-10-CM | POA: Diagnosis not present

## 2022-02-12 DIAGNOSIS — C679 Malignant neoplasm of bladder, unspecified: Secondary | ICD-10-CM | POA: Diagnosis not present

## 2022-02-13 DIAGNOSIS — F32A Depression, unspecified: Secondary | ICD-10-CM | POA: Diagnosis not present

## 2022-02-13 DIAGNOSIS — K760 Fatty (change of) liver, not elsewhere classified: Secondary | ICD-10-CM | POA: Diagnosis not present

## 2022-02-13 DIAGNOSIS — M1990 Unspecified osteoarthritis, unspecified site: Secondary | ICD-10-CM | POA: Diagnosis not present

## 2022-02-13 DIAGNOSIS — R634 Abnormal weight loss: Secondary | ICD-10-CM | POA: Diagnosis not present

## 2022-02-13 DIAGNOSIS — Z8639 Personal history of other endocrine, nutritional and metabolic disease: Secondary | ICD-10-CM | POA: Diagnosis not present

## 2022-02-13 DIAGNOSIS — D510 Vitamin B12 deficiency anemia due to intrinsic factor deficiency: Secondary | ICD-10-CM | POA: Diagnosis not present

## 2022-02-13 DIAGNOSIS — C679 Malignant neoplasm of bladder, unspecified: Secondary | ICD-10-CM | POA: Diagnosis not present

## 2022-02-13 DIAGNOSIS — I10 Essential (primary) hypertension: Secondary | ICD-10-CM | POA: Diagnosis not present

## 2022-02-13 DIAGNOSIS — Z87891 Personal history of nicotine dependence: Secondary | ICD-10-CM | POA: Diagnosis not present

## 2022-02-13 DIAGNOSIS — C787 Secondary malignant neoplasm of liver and intrahepatic bile duct: Secondary | ICD-10-CM | POA: Diagnosis not present

## 2022-02-13 DIAGNOSIS — E440 Moderate protein-calorie malnutrition: Secondary | ICD-10-CM | POA: Diagnosis not present

## 2022-02-13 DIAGNOSIS — C3492 Malignant neoplasm of unspecified part of left bronchus or lung: Secondary | ICD-10-CM | POA: Diagnosis not present

## 2022-02-13 DIAGNOSIS — N400 Enlarged prostate without lower urinary tract symptoms: Secondary | ICD-10-CM | POA: Diagnosis not present

## 2022-02-13 DIAGNOSIS — F419 Anxiety disorder, unspecified: Secondary | ICD-10-CM | POA: Diagnosis not present

## 2022-02-13 DIAGNOSIS — G629 Polyneuropathy, unspecified: Secondary | ICD-10-CM | POA: Diagnosis not present

## 2022-02-13 DIAGNOSIS — Z87448 Personal history of other diseases of urinary system: Secondary | ICD-10-CM | POA: Diagnosis not present

## 2022-02-13 DIAGNOSIS — E039 Hypothyroidism, unspecified: Secondary | ICD-10-CM | POA: Diagnosis not present

## 2022-02-17 DIAGNOSIS — C787 Secondary malignant neoplasm of liver and intrahepatic bile duct: Secondary | ICD-10-CM | POA: Diagnosis not present

## 2022-02-17 DIAGNOSIS — C3492 Malignant neoplasm of unspecified part of left bronchus or lung: Secondary | ICD-10-CM | POA: Diagnosis not present

## 2022-02-17 DIAGNOSIS — Z8639 Personal history of other endocrine, nutritional and metabolic disease: Secondary | ICD-10-CM | POA: Diagnosis not present

## 2022-02-17 DIAGNOSIS — Z87891 Personal history of nicotine dependence: Secondary | ICD-10-CM | POA: Diagnosis not present

## 2022-02-17 DIAGNOSIS — E440 Moderate protein-calorie malnutrition: Secondary | ICD-10-CM | POA: Diagnosis not present

## 2022-02-17 DIAGNOSIS — C679 Malignant neoplasm of bladder, unspecified: Secondary | ICD-10-CM | POA: Diagnosis not present

## 2022-02-18 DIAGNOSIS — C3492 Malignant neoplasm of unspecified part of left bronchus or lung: Secondary | ICD-10-CM | POA: Diagnosis not present

## 2022-02-18 DIAGNOSIS — C679 Malignant neoplasm of bladder, unspecified: Secondary | ICD-10-CM | POA: Diagnosis not present

## 2022-02-18 DIAGNOSIS — Z87891 Personal history of nicotine dependence: Secondary | ICD-10-CM | POA: Diagnosis not present

## 2022-02-18 DIAGNOSIS — E440 Moderate protein-calorie malnutrition: Secondary | ICD-10-CM | POA: Diagnosis not present

## 2022-02-18 DIAGNOSIS — Z8639 Personal history of other endocrine, nutritional and metabolic disease: Secondary | ICD-10-CM | POA: Diagnosis not present

## 2022-02-18 DIAGNOSIS — C787 Secondary malignant neoplasm of liver and intrahepatic bile duct: Secondary | ICD-10-CM | POA: Diagnosis not present

## 2022-02-20 DIAGNOSIS — C787 Secondary malignant neoplasm of liver and intrahepatic bile duct: Secondary | ICD-10-CM | POA: Diagnosis not present

## 2022-02-20 DIAGNOSIS — Z8639 Personal history of other endocrine, nutritional and metabolic disease: Secondary | ICD-10-CM | POA: Diagnosis not present

## 2022-02-20 DIAGNOSIS — Z87891 Personal history of nicotine dependence: Secondary | ICD-10-CM | POA: Diagnosis not present

## 2022-02-20 DIAGNOSIS — E440 Moderate protein-calorie malnutrition: Secondary | ICD-10-CM | POA: Diagnosis not present

## 2022-02-20 DIAGNOSIS — C679 Malignant neoplasm of bladder, unspecified: Secondary | ICD-10-CM | POA: Diagnosis not present

## 2022-02-20 DIAGNOSIS — C3492 Malignant neoplasm of unspecified part of left bronchus or lung: Secondary | ICD-10-CM | POA: Diagnosis not present

## 2022-02-24 DIAGNOSIS — C787 Secondary malignant neoplasm of liver and intrahepatic bile duct: Secondary | ICD-10-CM | POA: Diagnosis not present

## 2022-02-24 DIAGNOSIS — Z8639 Personal history of other endocrine, nutritional and metabolic disease: Secondary | ICD-10-CM | POA: Diagnosis not present

## 2022-02-24 DIAGNOSIS — C679 Malignant neoplasm of bladder, unspecified: Secondary | ICD-10-CM | POA: Diagnosis not present

## 2022-02-24 DIAGNOSIS — C3492 Malignant neoplasm of unspecified part of left bronchus or lung: Secondary | ICD-10-CM | POA: Diagnosis not present

## 2022-02-24 DIAGNOSIS — Z87891 Personal history of nicotine dependence: Secondary | ICD-10-CM | POA: Diagnosis not present

## 2022-02-24 DIAGNOSIS — E440 Moderate protein-calorie malnutrition: Secondary | ICD-10-CM | POA: Diagnosis not present

## 2022-02-25 DIAGNOSIS — C787 Secondary malignant neoplasm of liver and intrahepatic bile duct: Secondary | ICD-10-CM | POA: Diagnosis not present

## 2022-02-25 DIAGNOSIS — C3492 Malignant neoplasm of unspecified part of left bronchus or lung: Secondary | ICD-10-CM | POA: Diagnosis not present

## 2022-02-25 DIAGNOSIS — Z87891 Personal history of nicotine dependence: Secondary | ICD-10-CM | POA: Diagnosis not present

## 2022-02-25 DIAGNOSIS — E440 Moderate protein-calorie malnutrition: Secondary | ICD-10-CM | POA: Diagnosis not present

## 2022-02-25 DIAGNOSIS — C679 Malignant neoplasm of bladder, unspecified: Secondary | ICD-10-CM | POA: Diagnosis not present

## 2022-02-25 DIAGNOSIS — Z8639 Personal history of other endocrine, nutritional and metabolic disease: Secondary | ICD-10-CM | POA: Diagnosis not present

## 2022-02-26 DIAGNOSIS — Z8639 Personal history of other endocrine, nutritional and metabolic disease: Secondary | ICD-10-CM | POA: Diagnosis not present

## 2022-02-26 DIAGNOSIS — Z87891 Personal history of nicotine dependence: Secondary | ICD-10-CM | POA: Diagnosis not present

## 2022-02-26 DIAGNOSIS — C787 Secondary malignant neoplasm of liver and intrahepatic bile duct: Secondary | ICD-10-CM | POA: Diagnosis not present

## 2022-02-26 DIAGNOSIS — C679 Malignant neoplasm of bladder, unspecified: Secondary | ICD-10-CM | POA: Diagnosis not present

## 2022-02-26 DIAGNOSIS — C3492 Malignant neoplasm of unspecified part of left bronchus or lung: Secondary | ICD-10-CM | POA: Diagnosis not present

## 2022-02-26 DIAGNOSIS — E440 Moderate protein-calorie malnutrition: Secondary | ICD-10-CM | POA: Diagnosis not present

## 2022-03-15 DEATH — deceased

## 2023-05-22 IMAGING — PT NM PET TUM IMG RESTAG (PS) SKULL BASE T - THIGH
1 series · 20 of 20 positions shown · non-contrast
Comparison: PET-CT 05/27/2021

CLINICAL DATA: Subsequent treatment strategy for metastatic bladder
cancer.

EXAM:
NUCLEAR MEDICINE PET SKULL BASE TO THIGH
TECHNIQUE: 12.8 mCi F-18 FDG was injected intravenously. Full-ring PET imaging
was performed from the skull base to thigh after the radiotracer. CT
data was obtained and used for attenuation correction and anatomic
localization.
Fasting blood glucose: 137 mg/dl

[Series 1626: results mm oncology reading · 5.0mm · 0.97mm/px · 20 of 20 slices shown]
[im 1/20]
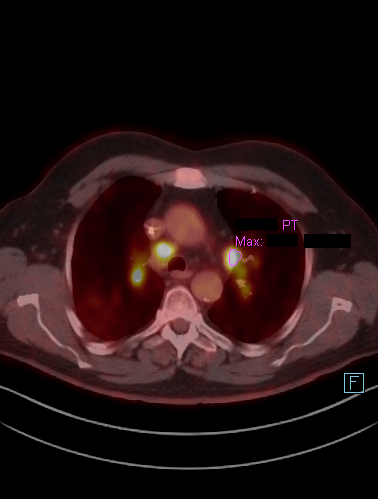
[im 2/20]
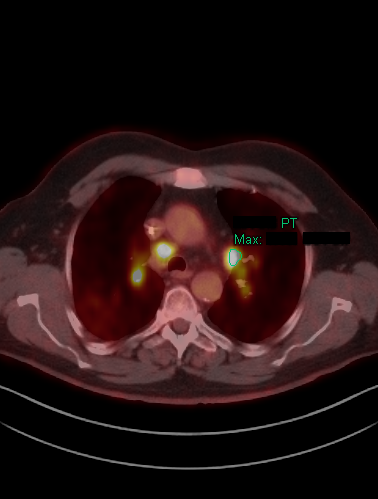
[im 3/20]
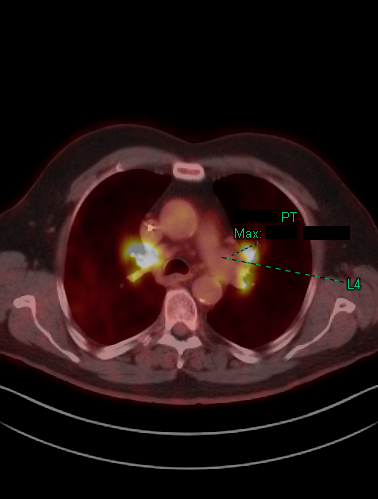
[im 4/20]
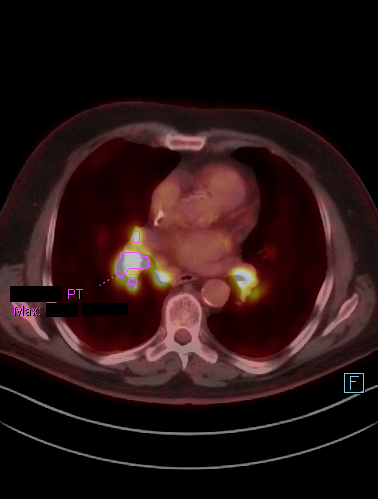
[im 5/20]
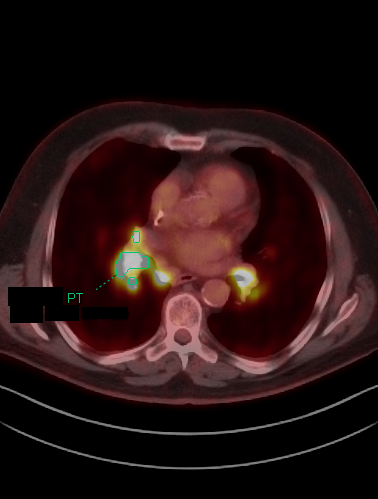
[im 6/20]
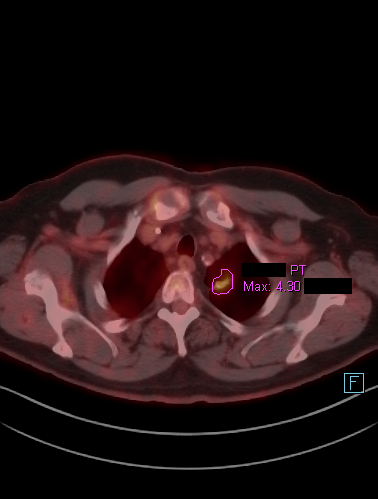
[im 7/20]
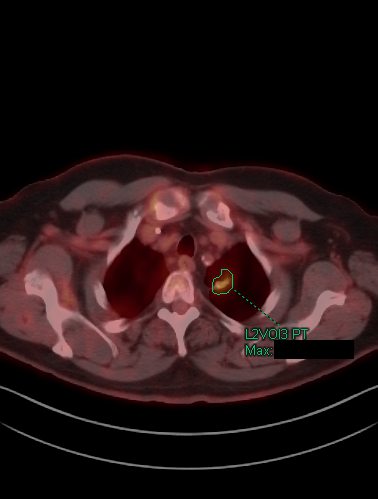
[im 8/20]
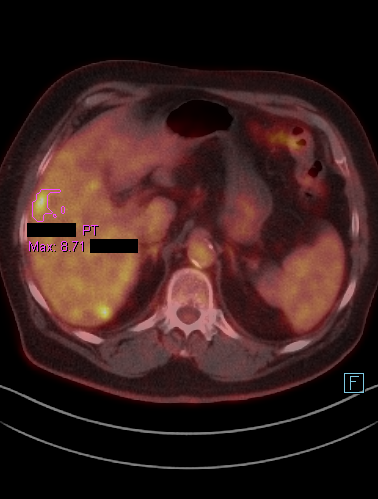
[im 9/20]
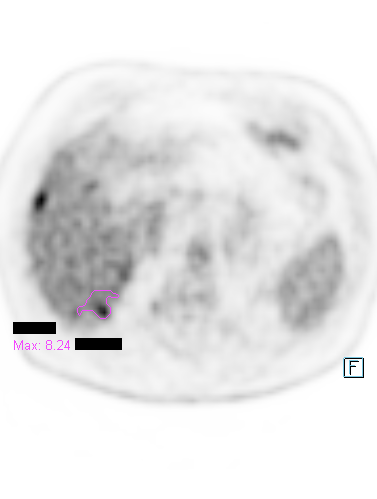
[im 10/20]
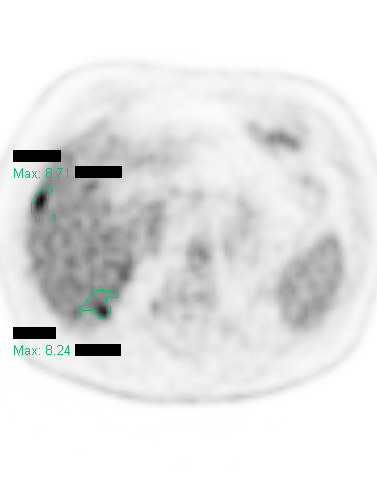
[im 11/20]
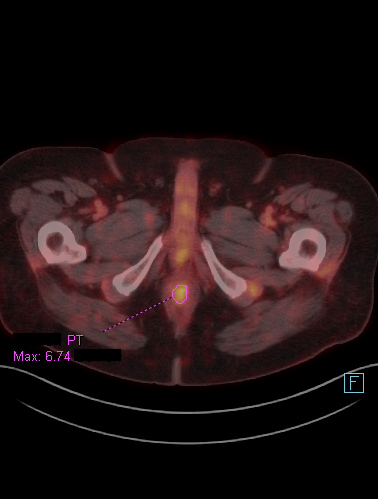
[im 12/20]
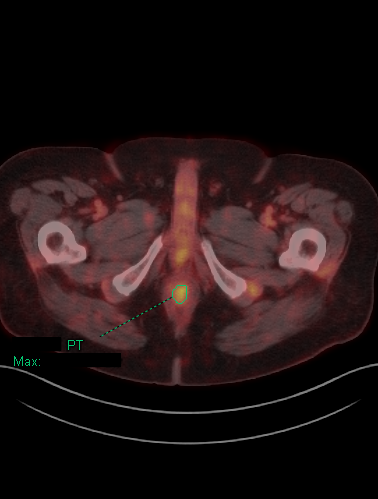
[im 13/20]
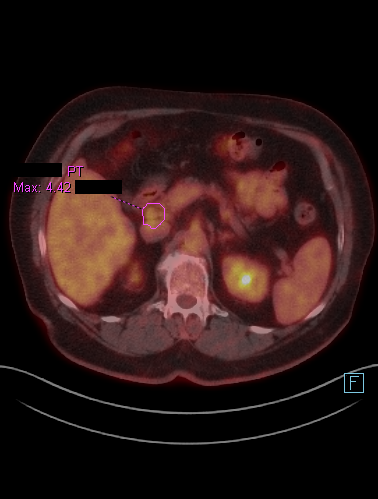
[im 14/20]
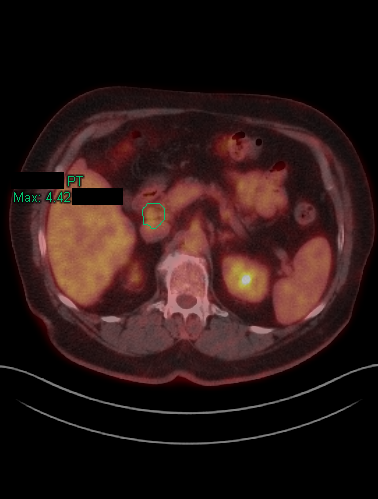
[im 15/20]
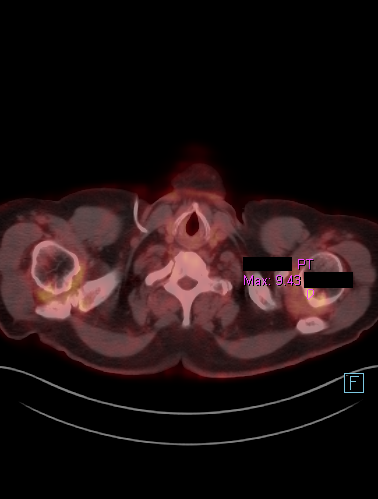
[im 16/20]
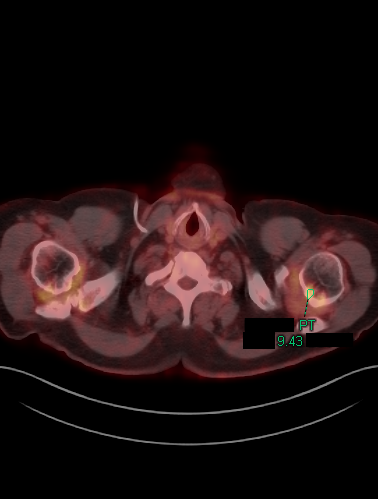
[im 17/20]
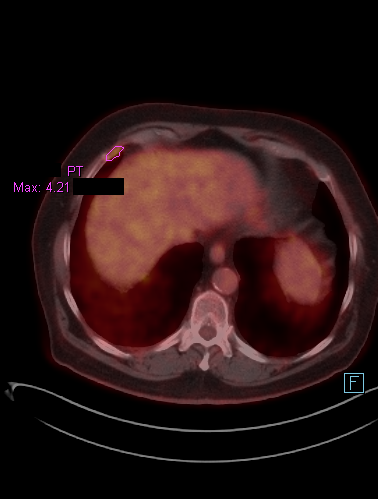
[im 18/20]
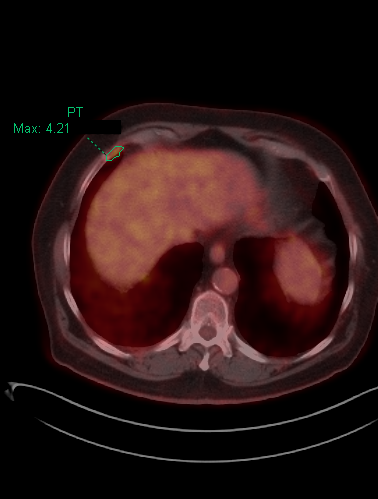
[im 19/20]
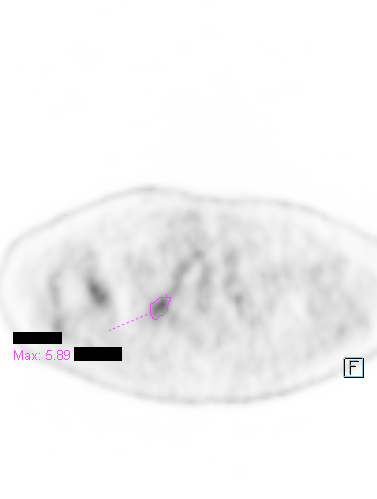
[im 20/20]
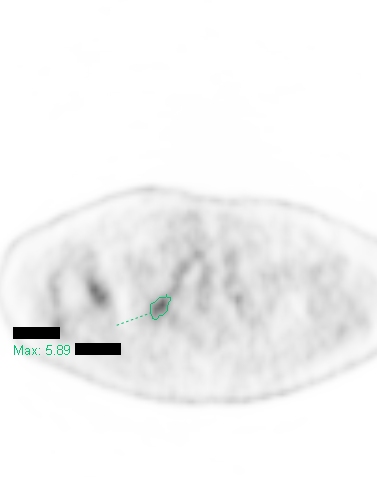

[20 of 20 positions shown; findings below may reference images not displayed]

FINDINGS: Mediastinal blood pool activity: SUV max

Liver activity: SUV max NA

NECK: No significant abnormal hypermetabolic activity in this
region.

Incidental CT findings: Bilateral common carotid atherosclerotic
calcification.

CHEST: New hypermetabolic paratracheal, AP window, bilateral hilar,
bilateral infrahilar, and subcarinal adenopathy. Index AP window
lymph node 1.4 cm in short axis on image 73 series 4 with maximum
SUV 17.4, previously 0.7 cm in short axis with maximum SUV 2.4.
Index conglomerate right hilar nodal activity maximum SUV 19.3,
previously 3.0.

The medial left upper lobe nodule measures about 1.7 by 1.1 cm on
image 64 series 4 (stable), maximum SUV 4.3 (formerly 2.9). Minimal
nodularity in the left upper lobe with several 3 mm nodules
identified on image 43 of series 8.

Incidental CT findings: Right Port-A-Cath tip: Cavoatrial junction.
Coronary, aortic arch, and branch vessel atherosclerotic vascular
disease.

ABDOMEN/PELVIS: The more anterior subcapsular right hepatic lobe
hepatic lesion shown on image 160 of series 605 has a maximum SUV of
8.7. The more posterior right hepatic lobe subcapsular lesion on
this image has a maximum SUV of 8.2. Both are substantially above
background liver activity. These are newly appreciable compared to
prior exam.

Accentuated anal activity, maximum SUV 6.7, probably physiologic
given the lack of obvious CT abnormality.

1.0 cm porta hepatis node, maximum SUV 4.4, equivocal for malignant
involvement.

Incidental CT findings: Atherosclerosis is present, including
aortoiliac atherosclerotic disease. Sigmoid colon diverticulosis.

SKELETON: Multifocal subcortical activity along the left humeral
head has accentuated metabolic activity compared to prior, maximum
SUV 9.5 and previously 4.2. The location appears to correspond to
degenerative subcortical cystic lesions or rotator cuff anchors
sites, conceivably this could reflect inflammatory disease rather
than necessarily representing osseous metastatic disease.

Accentuated metabolic activity along the right seventh rib costal
cartilage, maximum SUV 4.2, no underlying CT abnormality,
significance uncertain.

Suspected right first rib activity (although somewhat misregistered
with the CT data), maximum SUV 5.9, formerly 1.7.

Incidental CT findings: none
IMPRESSION: 1. Substantial progression of malignancy with considerable
hypermetabolic mediastinal and hilar adenopathy, increased activity
of the left upper lobe nodule, 2 new hepatic hypermetabolic lesions,
suspected recurrent abnormal activity in the right first rib, and
multifocal indeterminate subcortical activity in the left humeral
head.
2. Accentuated activity along the right seventh rib costal
cartilage, significance uncertain given the lack of an obvious CT
abnormality.
3. Other imaging findings of potential clinical significance: Aortic
Atherosclerosis ([id number]-[id number].S). Coronary and systemic
atherosclerosis. Sigmoid colon diverticulosis.
